# Patient Record
Sex: Female | Born: 1975 | Race: White | Hispanic: No | Marital: Married | Smoking: Never smoker
Health system: Southern US, Community
[De-identification: ages and names within clinical notes are randomized; demographics above are authoritative.]

## PROBLEM LIST (undated history)

## (undated) DIAGNOSIS — D649 Anemia, unspecified: Secondary | ICD-10-CM

## (undated) DIAGNOSIS — J9801 Acute bronchospasm: Secondary | ICD-10-CM

## (undated) DIAGNOSIS — G43909 Migraine, unspecified, not intractable, without status migrainosus: Secondary | ICD-10-CM

## (undated) DIAGNOSIS — K219 Gastro-esophageal reflux disease without esophagitis: Secondary | ICD-10-CM

## (undated) DIAGNOSIS — D219 Benign neoplasm of connective and other soft tissue, unspecified: Secondary | ICD-10-CM

## (undated) DIAGNOSIS — R7303 Prediabetes: Secondary | ICD-10-CM

## (undated) DIAGNOSIS — F329 Major depressive disorder, single episode, unspecified: Secondary | ICD-10-CM

## (undated) DIAGNOSIS — F32A Depression, unspecified: Secondary | ICD-10-CM

## (undated) DIAGNOSIS — T7421XA Adult sexual abuse, confirmed, initial encounter: Secondary | ICD-10-CM

## (undated) DIAGNOSIS — M1990 Unspecified osteoarthritis, unspecified site: Secondary | ICD-10-CM

## (undated) DIAGNOSIS — K900 Celiac disease: Secondary | ICD-10-CM

## (undated) DIAGNOSIS — L509 Urticaria, unspecified: Secondary | ICD-10-CM

## (undated) DIAGNOSIS — E7800 Pure hypercholesterolemia, unspecified: Secondary | ICD-10-CM

## (undated) DIAGNOSIS — R51 Headache: Secondary | ICD-10-CM

## (undated) DIAGNOSIS — M797 Fibromyalgia: Secondary | ICD-10-CM

## (undated) DIAGNOSIS — M549 Dorsalgia, unspecified: Secondary | ICD-10-CM

## (undated) DIAGNOSIS — I10 Essential (primary) hypertension: Secondary | ICD-10-CM

## (undated) DIAGNOSIS — F4310 Post-traumatic stress disorder, unspecified: Secondary | ICD-10-CM

## (undated) DIAGNOSIS — F419 Anxiety disorder, unspecified: Secondary | ICD-10-CM

## (undated) DIAGNOSIS — J3501 Chronic tonsillitis: Secondary | ICD-10-CM

## (undated) DIAGNOSIS — G4730 Sleep apnea, unspecified: Secondary | ICD-10-CM

## (undated) DIAGNOSIS — I639 Cerebral infarction, unspecified: Secondary | ICD-10-CM

## (undated) HISTORY — DX: Anemia, unspecified: D64.9

## (undated) HISTORY — DX: Dorsalgia, unspecified: M54.9

## (undated) HISTORY — DX: Benign neoplasm of connective and other soft tissue, unspecified: D21.9

## (undated) HISTORY — PX: OTHER SURGICAL HISTORY: SHX169

## (undated) HISTORY — DX: Acute bronchospasm: J98.01

## (undated) HISTORY — PX: PILONIDAL CYST EXCISION: SHX744

## (undated) HISTORY — DX: Major depressive disorder, single episode, unspecified: F32.9

## (undated) HISTORY — DX: Pure hypercholesterolemia, unspecified: E78.00

## (undated) HISTORY — DX: Migraine, unspecified, not intractable, without status migrainosus: G43.909

## (undated) HISTORY — DX: Depression, unspecified: F32.A

## (undated) HISTORY — PX: WISDOM TOOTH EXTRACTION: SHX21

## (undated) HISTORY — DX: Cerebral infarction, unspecified: I63.9

## (undated) HISTORY — DX: Prediabetes: R73.03

## (undated) HISTORY — DX: Celiac disease: K90.0

## (undated) HISTORY — DX: Urticaria, unspecified: L50.9

## (undated) HISTORY — DX: Adult sexual abuse, confirmed, initial encounter: T74.21XA

## (undated) HISTORY — DX: Essential (primary) hypertension: I10

---

## 2000-01-23 ENCOUNTER — Encounter: Payer: Self-pay | Admitting: Family Medicine

## 2000-01-23 ENCOUNTER — Encounter: Admission: RE | Admit: 2000-01-23 | Discharge: 2000-01-23 | Payer: Self-pay | Admitting: Family Medicine

## 2001-01-25 ENCOUNTER — Emergency Department (HOSPITAL_COMMUNITY): Admission: EM | Admit: 2001-01-25 | Discharge: 2001-01-26 | Payer: Self-pay | Admitting: Emergency Medicine

## 2002-08-11 ENCOUNTER — Ambulatory Visit (HOSPITAL_BASED_OUTPATIENT_CLINIC_OR_DEPARTMENT_OTHER): Admission: RE | Admit: 2002-08-11 | Discharge: 2002-08-11 | Payer: Self-pay | Admitting: Family Medicine

## 2005-01-01 ENCOUNTER — Ambulatory Visit (HOSPITAL_BASED_OUTPATIENT_CLINIC_OR_DEPARTMENT_OTHER): Admission: RE | Admit: 2005-01-01 | Discharge: 2005-01-01 | Payer: Self-pay | Admitting: General Surgery

## 2005-01-01 ENCOUNTER — Ambulatory Visit (HOSPITAL_COMMUNITY): Admission: RE | Admit: 2005-01-01 | Discharge: 2005-01-01 | Payer: Self-pay | Admitting: General Surgery

## 2005-01-01 ENCOUNTER — Encounter (INDEPENDENT_AMBULATORY_CARE_PROVIDER_SITE_OTHER): Payer: Self-pay | Admitting: *Deleted

## 2005-05-16 ENCOUNTER — Encounter: Admission: RE | Admit: 2005-05-16 | Discharge: 2005-06-28 | Payer: Self-pay

## 2005-07-02 ENCOUNTER — Ambulatory Visit (HOSPITAL_BASED_OUTPATIENT_CLINIC_OR_DEPARTMENT_OTHER): Admission: RE | Admit: 2005-07-02 | Discharge: 2005-07-02 | Payer: Self-pay | Admitting: General Surgery

## 2005-07-11 ENCOUNTER — Encounter: Admission: RE | Admit: 2005-07-11 | Discharge: 2005-10-09 | Payer: Self-pay | Admitting: Family Medicine

## 2006-02-04 ENCOUNTER — Encounter: Admission: RE | Admit: 2006-02-04 | Discharge: 2006-05-05 | Payer: Self-pay | Admitting: Family Medicine

## 2006-02-26 ENCOUNTER — Encounter (HOSPITAL_BASED_OUTPATIENT_CLINIC_OR_DEPARTMENT_OTHER): Admission: RE | Admit: 2006-02-26 | Discharge: 2006-03-14 | Payer: Self-pay | Admitting: Surgery

## 2006-03-18 ENCOUNTER — Encounter (HOSPITAL_BASED_OUTPATIENT_CLINIC_OR_DEPARTMENT_OTHER): Admission: RE | Admit: 2006-03-18 | Discharge: 2006-06-16 | Payer: Self-pay | Admitting: Surgery

## 2006-04-12 ENCOUNTER — Ambulatory Visit: Payer: Self-pay | Admitting: Pulmonary Disease

## 2006-04-12 ENCOUNTER — Inpatient Hospital Stay (HOSPITAL_COMMUNITY): Admission: EM | Admit: 2006-04-12 | Discharge: 2006-04-17 | Payer: Self-pay | Admitting: Emergency Medicine

## 2006-05-03 ENCOUNTER — Ambulatory Visit: Payer: Self-pay | Admitting: Pulmonary Disease

## 2006-06-25 ENCOUNTER — Encounter (HOSPITAL_BASED_OUTPATIENT_CLINIC_OR_DEPARTMENT_OTHER): Admission: RE | Admit: 2006-06-25 | Discharge: 2006-09-23 | Payer: Self-pay | Admitting: Surgery

## 2006-08-19 ENCOUNTER — Ambulatory Visit: Payer: Self-pay | Admitting: Internal Medicine

## 2007-02-04 ENCOUNTER — Encounter: Admission: RE | Admit: 2007-02-04 | Discharge: 2007-05-05 | Payer: Self-pay | Admitting: Family Medicine

## 2007-05-07 ENCOUNTER — Encounter: Admission: RE | Admit: 2007-05-07 | Discharge: 2007-07-15 | Payer: Self-pay | Admitting: Family Medicine

## 2007-08-20 ENCOUNTER — Encounter: Admission: RE | Admit: 2007-08-20 | Discharge: 2007-10-07 | Payer: Self-pay | Admitting: Family Medicine

## 2010-01-15 HISTORY — PX: COLONOSCOPY: SHX174

## 2010-01-15 HISTORY — PX: UPPER GASTROINTESTINAL ENDOSCOPY: SHX188

## 2010-05-30 NOTE — Assessment & Plan Note (Signed)
[facility]   NAME:  [name], [name]                 ACCOUNT NO.:  [id number]   MEDICAL RECORD NO.:  [id number]      DATE OF BIRTH:  May 24, 1975   PHYSICIAN:  Joneen Boers A. Nils Pyle, M.D. VISIT DATE:  07/10/2006                                   OFFICE VISIT   SUBJECTIVE:  The patient is a 35 year old lady with pilonidal cyst that  is healing by secondary intent.  She has been treated with Iodosorb gel  and local antiseptic wash.  She continues to be fully employed.  She is  having no pain.  There is no excessive drainage.  She does note that  there is some blood tinge on her cotton underwear.  There has been no  fever.   OBJECTIVE:  Blood pressure is 117/77, respirations 18, pulse rate 86,  temperature 98.4.  Inspection of the pilonidal area shows that the cyst  has continued to contract with healthy granulating tissue.  There is no  evidence of new sinus formation, nor is there suppurative change.  There  is no cellulitis.  There is no evidence of abscess.   IMPRESSION:  Clinical improvement.   PLAN:  We will continue antiseptic soap washes and topical Iodosorb gel.  The patient will be permitted to go into the pool.  We will re-evaluate  the patient in 2 weeks p.r.n.      Harold A. Nils Pyle, M.D.  Electronically Signed     HAN/MEDQ  D:  07/10/2006  T:  07/11/2006  Job:  [id number]

## 2010-05-30 NOTE — Assessment & Plan Note (Signed)
[facility]   NAME:  NAVEH, RICKLES                 ACCOUNT NO.:  [id number]   MEDICAL RECORD NO.:  [id number]      DATE OF BIRTH:  17-Aug-1975   PHYSICIAN:  Joneen Boers A. Nils Pyle, M.D. VISIT DATE:  07/29/2006                                   OFFICE VISIT   SUBJECTIVE:  Ms. [name] returns for followup of a slowly healing  pilonidal sinus.  The wound has been incompletely excised and was  allowed to heal by secondary intent.  In the interim she denies  excessive drainage, malodor, pain, or fever.  She continues to be  gainfully employed.  She has been in swimming pools without evidence of  drainage or fever.   OBJECTIVE:  Blood pressure is 127/88, respirations 20, pulse rate of 98,  temperature 98.2.  Inspection of the pilonidal wound shows that the area continues to  contract.  The granulation tissue is markedly friable but there is no  malodor.  There is no hyperemia and no evidence of loculation as probed  with a Q-tip.   ASSESSMENT:  Slowly healing pilonidal.   PLAN:  We will continue the Iodosorb gel and b.i.d. showers.  We will  continue the cotton balls as needed to control drainage.  We will  reevaluate the patient in 1 month p.r.n.      Harold A. Nils Pyle, M.D.  Electronically Signed     HAN/MEDQ  D:  07/29/2006  T:  07/30/2006  Job:  [id number]

## 2010-05-30 NOTE — Assessment & Plan Note (Signed)
[facility]   NAME:  BLONDINE, HOTTEL                 ACCOUNT NO.:  [id number]   MEDICAL RECORD NO.:  [id number]            DATE OF BIRTH:   PHYSICIAN:  Ricard Dillon, M.D. VISIT DATE:  06/28/2006                                   OFFICE VISIT   PURPOSE OF TODAY'S VISIT:  Followup of a pilonidal ductus cyst.  She has  continued to use Iodosorb and sitz baths daily.  She reports she has had  a week's worth of mild, bloody drainage; however, this has resolved on  its own.  She does not report any fever.   Inspection of the distal coccyx area shows that the pilonidal cyst looks  clean.  There is no evidence of surrounding infection.  There is some  epithelization.  The area is very painful to try and examine.  There is  no significant drainage.   IMPRESSION:  Continued improvement of a pilonidal duct cyst.  We are  going to continue the Iodosorb and sitz baths.  I wondered about some  form of topical dressing; however, this would need to be done by another  person.  As there is evidence of improvement, we continued the exact  same treatment.  I discussed this with her.  The patient expressed the  wish that we do our best to heal this over the summer when she is not  working.  We will see her again in 2 weeks.           ______________________________  Ricard Dillon, M.D.     MGR/MEDQ  D:  06/28/2006  T:  06/29/2006  Job:  [id number]

## 2010-05-30 NOTE — Assessment & Plan Note (Signed)
[facility]                             PULMONARY OFFICE NOTE   NAME:HOUCKJonay, Hitchcock                        MRN:          [id number]  DATE:08/19/2006                            DOB:          19-Mar-1975    CHIEF COMPLAINT:  Dyspnea.   HISTORY OF PRESENT ILLNESS:  A 35 year old, white female, status post  prolonged hospitalization at [facility] for a clinical diagnosis of  pneumonia in April, with  no organism specified.  She improved following  this hospitalization and was seen actually for post hospital followup  for the pneumonia by Dr. Halford Chessman who did not recommend any further followup  on April 18.  At that point, she weighed 234 pounds.  Subsequently, she  has had a progressive loss of activity tolerance where she has trouble  walking more than 20 minutes now.  Occasionally, she also has difficulty  with dyspnea sitting still, but never lying down of sleeping.  She has  noticed occasional heartburn symptoms, occasional chest discomfort, but  these are fleeting and not related to her dyspnea.  She also notices  marked voice fatigue with hoarseness.  She denies any pleuritic pain,  fevers, chills, sweats, orthopnea, PND or leg swelling.   PAST MEDICAL HISTORY:  1. Moderate obesity.  2. Periodic limb movement disorder.  3. Hypertension.  4. History of anemia.   ALLERGIES:  No known drug allergies.   MEDICATIONS:  She is on no regular medicines.  Previously, she had been  on ACE inhibitors.  These were stopped in May, she thinks.   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   FAMILY HISTORY:  Significant for the absence of asthma.  She says  everyone in the family is allergic to dust.   REVIEW OF SYSTEMS:  Taken in detail on the worksheet and negative,  except as outlined above.   PHYSICAL  EXAMINATION:  GENERAL:  This is a slightly anxious, but  pleasant, ambulatory, white female who clears her throat frequently  during exam and has a classic voice fatigue pattern of speech.  VITAL SIGNS:  She is afebrile with normal vital signs and a weight now  of 249 pounds.  HEENT:  Unremarkable.  Pharynx is clear.  NECK:  Supple without cervical adenopathy or tenderness.  Trachea  midline.  LUNGS:  Perfectly clear bilaterally to auscultation with excellent air  movement.  HEART:  Regular rate and rhythm without murmurs, rubs or gallops.  ABDOMEN:  Soft, benign.  EXTREMITIES:  Warm without calf tenderness, cyanosis, clubbing or edema.   We walked the patient around rapidly in the office for three laps each  consisting of 185 feet.  Her saturation maintained in the high 90s with  a peak heart rate of 119.   IMPRESSION:  Progressive weight gain is the most likely cause of the  patient's dyspnea.  The fact that she has paroxysms with dyspnea at rest  associated with voice  fatigue also suggests another complication of  weight gain, namely intermittent LPR/VCD.  PE or occult PF would not  cause this pattern of dyspnea which we could not reproduce here in the  office.   PLAN:  1. First, I reviewed a diet with her emphasizing the avoidance of mint      and menthol lozenges and the importance of also reducing calorie      intake to improve calorie balance overall.  Empiric treatment with      Aciphex 20 mg tablets taken before breakfast daily, 20 days of      samples given with a prescription filled or the option of either      filling a prescription or taking Prilosec if she feels she is      improving in terms of any of her symptoms on this regimen.  2. I would like to see the patient back in a month for PFTs and a      chest x-ray.     Christena Deem. Melvyn Novas, MD, [facility]  Electronically Signed    MBW/MedQ  DD: 08/19/2006  DT: 08/20/2006  Job #: [id number]   cc:   Suzi Roots

## 2010-06-02 NOTE — Discharge Summary (Signed)
[name], [name]                 ACCOUNT NO.:  [id number]   MEDICAL RECORD NO.:  [id number]          PATIENT TYPE:  INP   LOCATION:  1700                         FACILITY:  [facility]   PHYSICIAN:  Neysa Bonito, MD  DATE OF BIRTH:  [birth year]   DATE OF ADMISSION:  04/12/2006  DATE OF DISCHARGE:                               DISCHARGE SUMMARY   CHIEF COMPLAINT ON ADMISSION:  Cough, body aches, nausea, three days of  shortness of breath.   HISTORY OF PRESENT ILLNESS:  A 35 year old Caucasian female came with  shortness of breath and diagnosis of pneumonia.  The patient received  antibiotics during hospitalization, and she is stable since admission on  the antibiotic.  On the CAT scan on the admission date to rule out PE,  the impression was read as:  1)  No pulmonary emboli.  2)  Extensive  pneumonia at the right lung base.  3)  Mediastinal adenopathy,  questionable sarcoidosis.   Patient was seen by pulmonary service, Dr. Halford Chessman, for evaluation of  sarcoidosis.  The recommendation was to repeat the CT test, which was  done in April, 2008 with the impression of:  1)  Persistent right middle  and right lower lobe infiltrates.  There is a slightly less dense air  space consolidation.  2)  Small right-sided pleural effusion.  3)  Minimal left lower lobe infiltrates.  4)  Stable mediastinal and hilar  adenopathy.   The patient now has been fever-free for the last 24 hours.  We will  discharge the patient to continue on antibiotic for 14 more days, Avelox  400 mg p.o. and to follow up with her pulmonologist, Dr. Halford Chessman, as  discussed with her.   DISCHARGE DIAGNOSES:  1. Right middle and lower lobe pneumonia.  2. Hilar adenopathy with questionable sarcoid disease.  3. Hypertension.  4. Obesity.  5. Depression.  6. Migraine.  7. Anxiety.  8. Borderline diabetes.   DISCHARGE MEDICATIONS:  1. Avelox 400 mg p.o. daily x14 days.  2. Ferrous sulfate 325 mg p.o. daily.  3. Home oxygen  2-4 liters continuous.  4. Topamax 100 mg nightly.  5. Ambien 10 mg p.o. nightly.  6. Risperdal 0.5 mg daily.  7. Zoloft 200 mg p.o. daily.  8. Altace 2.5 mg orally daily.   DISCHARGE PLAN:  I have discussed with the patient.  Patient should  follow up with her pulmonologist, [facility], Dr. Halford Chessman.  The  phone number is [phone].  Patient will be discharged on home oxygen and  antibiotics, as discussed above.      Neysa Bonito, MD  Electronically Signed     EME/MEDQ  D:  04/17/2006  T:  04/17/2006  Job:  [id number]

## 2010-06-02 NOTE — Op Note (Signed)
NAMENETASHA, WEHRLI                 ACCOUNT NO.:  [id number]   MEDICAL RECORD NO.:  [id number]          PATIENT TYPE:  AMB   LOCATION:  DSC                          FACILITY:  [facility]   PHYSICIAN:  Gwenyth Ober, M.D.    DATE OF BIRTH:  1975-09-29   DATE OF PROCEDURE:  DATE OF DISCHARGE:                                 OPERATIVE REPORT   PREOPERATIVE DIAGNOSIS:  Nonhealing, previously excised pilonidal disease.   PREOPERATIVE DIAGNOSIS:  Nonhealing, previously excised pilonidal disease.   PROCEDURE:  Reexcision of pilonidal disease with debridement of nonhealing  wound.   SURGEON:  Gwenyth Ober, M.D.   ASSISTANT:  None.   ANESTHESIA:  General endotracheal.   POSITION:  Jackknife prone.   COMPLICATIONS:  None.   CONDITION:  Stable.   SPECIMENS:  No specimens were sent.   ESTIMATED BLOOD LOSS:  Less than 50 cubic centimeters.   INDICATION FOR OPERATION:  The patient is a healthy 35 year old who,  approximately 6 months ago, had a pilonidal disease excised, which has  failed to heal over several months' time, in spite of dressing changes and  wound care, and now comes in for re-exploration.   FINDINGS:  The patient had a nonhealing sinus deep into a cavity at the  fascial level.  The skin above that and fascia had healed well.  However,  this left an infected pocket, which we had to open and debris.   OPERATION:  The patient was taken to the operating room, placed initially on  a gurney in the supine position.  After an adequate endotracheal anesthetic  was administered, she was flipped into the jackknife prone position and  prepped and draped in the usual sterile manner, exposing the pilonidal  diseased area.   Used a Kelly clamp to define the cavity and the sinus track leading down to  the deep pocket just above the presacral fascia.  We opened up this entire  area, using a #10 blade.  Once we got down into the deepest portion of the  pocket, we used a rongeur and a  curette to scrape off all of the dead  infected tissue.  Also we used a Kelly clamp to remove a lot of the loose,  sort of liquefied necrotic tissue.  We scraped it well and opened it up down  to the fascial level  where, after we controlled hemostasis pretty well, we packed it with 6 wet 4  x 4 gauze, which had been opened up, soaked in saline, and covered with a  sterile dressing.  We did inject 10 cubic centimeters  of 0.25% Marcaine  with epinephrine into the wound.  The  final wound measured 10 cm x 5 cm  wide x 4.5 cm deep.      Gwenyth Ober, M.D.  Electronically Signed     JOW/MEDQ  D:  07/02/2005  T:  07/03/2005  Job:  [id number]

## 2010-06-02 NOTE — H&P (Signed)
NAMEBULAR, HICKOK                 ACCOUNT NO.:  [id number]   MEDICAL RECORD NO.:  [id number]          PATIENT TYPE:  INP   LOCATION:  0110                         [facility]AC[facility]  [facility]   PHYSICIAN:  Durwin Nora, MDDATE OF BIRTH:  1975/10/17   DATE OF ADMISSION:  04/12/2006  DATE OF DISCHARGE:                              HISTORY & PHYSICAL   PRESENTING COMPLAINT:  Cough, nausea, body ache, fever, three days.  Shortness of breath, one day.   HISTORY OF PRESENTING COMPLAINT:  This is a 35 year old Caucasian lady  was quite well until three days ago when she started experiencing on and  off fever with dry cough and generalized body aches.  She reported to  her primary care physician, Dr. Agustina Caroli, who had an EKG for her  and sent her home with analgesics; however, patient had recurrence of  the fever with nausea.  No vomiting.  No abdominal pain.  No diarrhea.  She has been having frequent urination but no dysuria.  She was  extremely diaphoretic, very sweaty last night, with high-grade fever and  chills.  No palpitations.  No hemoptysis.  She reported to the emergency  room, where PE was ruled out with a negative CT angiogram but chest x-  ray was showing opacities in the right mid lobe.   PAST MEDICAL HISTORY:  Migraine, depression, anxiety, borderline  diabetes.   PAST SURGICAL HISTORY:  Pilonidal cyst, removed in December, 2006 and  also in June, 2007.   FAMILY HISTORY:  Father has hypertension, diabetes,  hypercholesterolemia, CA of the prostate, CA of the lung, mother with  hypertension, hypercholesterol, diabetes, CVA.   SOCIAL HISTORY:  She denies any use of excessive alcohol, tobacco or  drug abuse.   MEDICATIONS:  1. Zoloft 200 mg daily.  2. Risperdal 0.5 mg daily.  3. Mirapex 10 mg daily.  4. Topamax 100 mg daily.   No known drug allergies.   PHYSICAL EXAMINATION:  GENERAL:  She is a young lady, not in acute  respiratory distress.  VITAL SIGNS:   Temperature 99.9, pulse 124, respiratory rate 20, blood  pressure 161/91.  HEENT:  Pallor, mild.  Anicteric.  Atraumatic and normocephalic.  Pupils  are equal, round and reactive to light and accommodation.  Oropharynx  clear.  HEART:  Sounds S1 and S2 with tachycardia.  LUNGS:  She has coarse breath sounds in the right hemithorax.  ABDOMEN:  Benign.  NEUROLOGIC:  She is alert and oriented x 3.  Peripheral pulses present.  No edema.  Cranial nerves I-XII intact.  Power is 5 in all limbs.   ASSESSMENT/PLAN:  Community-acquired pneumonia, anemia, sinus  tachycardia, moderate hypertension, obesity.  The plan is to admit to  the medical floor to [facility] hospitalist team.  Vital signs each  shift.  Activity:  Bed rest.  Pulse ox monitor to keep oxygen sats  greater than 90.  Cardiac diet.  IV fluids.  Normal saline at 60 cc/hr.  Blood culture x2.  Sputum culture if obtainable.  TSH, fasting lipid  panel, urinalysis, culture and sensitivity.  Lovenox  40 mg subcu daily.  Protonix 40 mg IV daily.  Phenergan 12.5 mg q.8h. p.r.n.  Duonebs 3 mg  q.6h. p.r.n.  Mucinex 1 tablet b.i.d.  Robitussin-DM 5 ml q.4h. p.r.n.  Tylenol 650 mg q.4-6h. p.r.n. fever and pain.  Rocephin 1 gm IV daily.  Zithromax 500 mg IV daily.  Iron sulfate 325 mg b.i.d.  Norvasc 5 mg  p.o. daily.  Zoloft 200 mg p.o. daily.  Risperdal 0.5 mg p.o. daily.  Ambien 10 mg p.o. daily.  Topamax 100 mg p.o. daily.  O2 two liters  nasal cannula.   treatment plan explained to her.  She verbalizes understanding.   TIME:  Forty-five minutes.      Durwin Nora, MD  Electronically Signed     MIO/MEDQ  D:  04/12/2006  T:  04/12/2006  Job:  [id number]

## 2010-06-02 NOTE — Consult Note (Signed)
[name], [name] NO.:  [id number]   MEDICAL RECORD NO.:  [id number]         PATIENT TYPE:  LINP   LOCATION:                               FACILITY:  [facility]   PHYSICIAN:  Chesley Mires, MD        DATE OF BIRTH:  [birth year]   DATE OF CONSULTATION:  DATE OF DISCHARGE:                                 CONSULTATION   Portions of dictation not audible ......................   She had undergone a CT scan of chest in the ER which shows a right  middle lobe consolidation with right lower lobe infiltrate and  mediastinal adenopathy.  She was started initially on Rocephin and  erythromycin but has since been changed to Avelox and vancomycin.  Two  episodes of oxygen desaturation.  She had a repeat CT of the chest which  shows essentially the same finding.  She also had pulmonary function  tests which shows a ____________________________________.  No  significant bronchodilator response.  Total capacity was 2.45 L which  was 46% predicted.  _____________ was 2 which was 34% predicted but  corrective lung volumes was _____ predicted.  This was done 4/1.  She  saids she has not had any recent travel history, she does own three cats  but has not had any overt animal exposure.  She works ________________   IMPRESSION:  Community-acquired pneumonia.   Repeat evaluation for the patient over the next several weeks, several  months, further evaluation for possible _________, Status post  bronchoscopy with tissue sampling, however I do not feel that she is  ___________________________.   I also do not feel that she needs to have addition of Vancomycin as it  is unlikely that she has a methicillin-resistant Staphylococcus aureus  as cause of her pneumonia, particularly in light of the fact that she  seems to have improved clinically with her previous course of  antibiotics.  Additionally, she states that she has had two previous  ___________________________- which per the  patient was negative for  sleep apnea.  She denied any history that would be significant for  restless leg syndrome.  The fact is she is found to have periodic leg  movements on her overnight placed __________ and therefore have  continued her on ________.  Additionally ________ levels checked with ambulation prior to discharge  to determine if she is in need of supplemental oxygen, at least  temporarily while at home.   I will follow along with you during the remainder of her hospital course  although I suspect she should be able to be discharged from the hospital  sometime soon and then further evaluation will be done with office  followup.      Chesley Mires, MD     VS/MEDQ  D:  04/16/2006  T:  04/16/2006  Job:  [id number]

## 2011-07-09 ENCOUNTER — Encounter (HOSPITAL_BASED_OUTPATIENT_CLINIC_OR_DEPARTMENT_OTHER): Payer: Self-pay | Admitting: *Deleted

## 2011-07-15 NOTE — H&P (Signed)
Assessment  . Pain in TMJ   (526.9) . The tonsils were enlarged   (474.11) . Abnormal auditory perception   (388.40) . Esophageal reflux   (530.81) . Obesity   (278.00) . Migraine headache   (346.90) Orders  Audiological Evaluation; Comprehensive Audiometry; Tympanometry Bilateral; Tympanometry Bilateral; Requested for: 03 Jul 2011. 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 Recommended she consult with her dentist about this. She had been prescribed a mouth guard years ago but it doesn't fit any longer. There is no evidence of ear disease and I doubt she ever had an ear infection.   Chronic tonsillitis. Recommend consideration for tonsillectomy. We discussed the risks and benefits of this procedure. She understands and agrees.   Reflux and excessive caffeine consumption. Recommend she try very hard to cut back on caffeine in the form of soft drinks and atrophic. We also discussed her obesity and her risks for diabetes, caffeine increases this risk. Reason For Visit  Aeriana [name] is here today at the kind request of Lynne Logan for consultation and opinion for ear infections and strep throat. HPI  She has a history of recurring tonsillopharyngitis. She's had multiple episodes of strep throat over the years, and over the past couple of years gets it several times each year. 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 Her tonsils are large most of the time. She does snore on a regular basis according to her husband. She has had trouble with obesity and is on medication to try to lose weight. She also suffers with chronic heartburn. She drinks about 30 ounces of cola each day. She used to drink 24 cans of Coca-Cola daily. She has recurring episodes of right ear pain that had been diagnosed as ear infection. She complains of some hearing loss on the right side. Allergies  No Known Drug Allergies. Current Meds    Phentermine HCl - 37.5 MG Oral Tablet;TAKE 1 TABLET DAILY; RPT Mirtazapine 15 MG Oral Tablet Dispersible;DISSOLVE ONE TABLET ON TONGUE AND SWALLOW WITH OR WITHOUT WATER ONCE DAILY AT BEDTIME.; Rx Zonisamide 100 MG Oral Capsule;TAKE 2 CAPSULE DAILY; Rx * Pt will increase to  2oo mg ,starting tonight 03/23/10, 23 Mar 2010 [place] 6 MG/0.5ML Subcutaneous Solution;INFUSE 1 ML ONCE PRN headache with needle and syringe to be given sub Q; Rx * PA approved by [facility] [phone]) through 05/08/12 - pt TG#[id number]., 09 May 2011. Active Problems  Acute Otitis Media (382.9) Acute Pharyngitis (462) Acute Suppurative Otitis Media (382.00) Anxiety (Symptom) (300.00) Carpal Tunnel Syndrome (354.0) Cough (Symptom) (786.2) Dyspepsia (536.8) Insomnia (780.52) Lower Back Pain (724.2) Migraine Headache (346.90) Radiculopathy (729.2) Restless Legs Syndrome (333.94) Tonsils Enlargement (474.11). PMH  Acute Bronchitis (466.0) Acute Pharyngitis (462) Acute Pharyngitis Streptococcus (034.0) Community-acquired Pneumonia (486) Cough (Symptom) (786.2) Cough (Symptom) (786.2) Malaise (780.79) Nausea (Symptom) (787.02) Mild Obstructive Sleep Apnea (327.23) Streptococcal Sore Throat (034.0) Viral Syndrome (079.99) Visit For: Examination For Adoption (V70.3). PSH  Oral Surgery Tooth Extraction Pilonidal Cyst Resection. Family Hx  Family history of Allergies Family history of Cancer Family history of Depressive Disorder, NOS Family history of Diabetes Mellitus Family history of Heart Disease Family history of Parkinson's Disease Family history of Seizure Family history of Stroke Syndrome. Personal Hx  Alcohol Use; rarely Caffeine Use; 1-2 Coke Zero/day Denied Drug Use Education History; Bachelors degree, persuing Masters Marital History - Single Never A Smoker Denied Tobacco Use Working Full Time; Pharmacist, hospital, preschool  Autism. ROS  Systemic: Feeling tired (fatigue)  and night  sweats. Head: Headache. Otolaryngeal: Earache, tinnitus, snoring, sneezing, hoarseness, and sore throat. Cardiovascular: Chest pain or discomfort. Pulmonary: Cough  and wheezing. Gastrointestinal: Dysphagia, heartburn, and nausea. Endocrine: Muscle weakness. Musculoskeletal: Calf muscle cramps, arthralgias, and soft tissue swelling. Neurological: Dizziness, fainting, tingling, and numbness. Psychological: Anxiety  and depression. 12 system ROS was obtained and reviewed on the Health Maintenance form dated today.  Positive responses are shown above.  If the symptom is not checked, the patient has denied it. Vital Signs   Recorded by Skolimowski,Sharon on 03 Jul 2011 10:18 AM BP:120/82,  Height: 64.5 in, Weight: 230 lb, BMI: 38.9 kg/m2,  BSA Calculated: 2.09 ,  BMI Calculated: 38.87. Physical Exam  APPEARANCE: Obese young lady, in no acute distress.  Normal affect, in a pleasant mood.  Oriented to time, place and person. COMMUNICATION: Normal voice   HEAD & FACE:  No scars, lesions or masses of head and face.  Sinuses nontender to palpation.  Salivary glands without mass or tenderness.  Facial strength symmetric.  No facial lesion, scars, or mass. EYES: EOMI with normal primary gaze alignment. Visual acuity grossly intact.  PERRLA EXTERNAL EAR & NOSE: No scars, lesions or masses  EAC & TYMPANIC MEMBRANE:  EAC shows no obstructing lesions or debris and tympanic membranes are normal bilaterally with good movement to insufflation. GROSS HEARING: Tuning fork is diminished on the right but lateralizes to the left. TMJ: Tender on the right when she opens. When she opens, her mandible deviates to the left side. INTRANASAL EXAM: No polyps or purulence.  NASOPHARYNX: Normal, without lesions. LIPS, TEETH & GUMS: No lip lesions, normal dentition and normal gums. ORAL CAVITY/OROPHARYNX:  Oral mucosa moist without lesion or asymmetry of the palate, tongue, tonsil or posterior pharynx. Tonsils are  relatively small and free of any infection today. NECK:  Supple without adenopathy or mass. THYROID:  Normal with no masses palpable.  NEUROLOGIC:  No gross CN deficits. No nystagmus noted.   LYMPHATIC:  No enlarged nodes palpable. Results  Tympanograms and audiometric thresholds are normal bilaterally.

## 2011-07-16 ENCOUNTER — Encounter (HOSPITAL_BASED_OUTPATIENT_CLINIC_OR_DEPARTMENT_OTHER): Payer: Self-pay | Admitting: Anesthesiology

## 2011-07-16 ENCOUNTER — Ambulatory Visit (HOSPITAL_BASED_OUTPATIENT_CLINIC_OR_DEPARTMENT_OTHER): Payer: BC Managed Care – PPO | Admitting: Anesthesiology

## 2011-07-16 ENCOUNTER — Encounter (HOSPITAL_BASED_OUTPATIENT_CLINIC_OR_DEPARTMENT_OTHER)
Admission: RE | Disposition: A | Discharge status: Home / Self Care | Payer: Self-pay | Source: Ambulatory Visit | Attending: Otolaryngology

## 2011-07-16 ENCOUNTER — Ambulatory Visit (HOSPITAL_BASED_OUTPATIENT_CLINIC_OR_DEPARTMENT_OTHER)
Admission: RE | Admit: 2011-07-16 | Discharge: 2011-07-16 | Disposition: A | Discharge status: Home / Self Care | Payer: BC Managed Care – PPO | Source: Ambulatory Visit | Attending: Otolaryngology | Admitting: Otolaryngology

## 2011-07-16 DIAGNOSIS — G43909 Migraine, unspecified, not intractable, without status migrainosus: Secondary | ICD-10-CM | POA: Insufficient documentation

## 2011-07-16 DIAGNOSIS — J3501 Chronic tonsillitis: Secondary | ICD-10-CM

## 2011-07-16 DIAGNOSIS — K219 Gastro-esophageal reflux disease without esophagitis: Secondary | ICD-10-CM | POA: Insufficient documentation

## 2011-07-16 HISTORY — DX: Gastro-esophageal reflux disease without esophagitis: K21.9

## 2011-07-16 HISTORY — DX: Anxiety disorder, unspecified: F41.9

## 2011-07-16 HISTORY — PX: TONSILLECTOMY: SHX5217

## 2011-07-16 HISTORY — DX: Headache: R51

## 2011-07-16 HISTORY — DX: Chronic tonsillitis: J35.01

## 2011-07-16 LAB — POCT HEMOGLOBIN-HEMACUE: Hemoglobin: 12.4 g/dL (ref 12.0–15.0)

## 2011-07-16 SURGERY — TONSILLECTOMY
Anesthesia: General | Site: Mouth | Wound class: Clean Contaminated

## 2011-07-16 MED ORDER — METOCLOPRAMIDE HCL 5 MG/ML IJ SOLN: 10.0000 mg | Freq: Once | INTRAMUSCULAR | Status: DC | PRN

## 2011-07-16 MED ORDER — ACETAMINOPHEN 10 MG/ML IV SOLN
1000.0000 mg | Freq: Once | INTRAVENOUS | Status: AC
Start: 2011-07-16 — End: 2011-07-16
  Administered 2011-07-16: 1000 mg via INTRAVENOUS

## 2011-07-16 MED ORDER — HYDROCODONE-ACETAMINOPHEN 7.5-500 MG/15ML PO SOLN: 10.0000 mL | Freq: Four times a day (QID) | ORAL | Status: DC | PRN

## 2011-07-16 MED ORDER — PROPOFOL 10 MG/ML IV EMUL
INTRAVENOUS | Status: DC | PRN
  Administered 2011-07-16: 250 mg via INTRAVENOUS

## 2011-07-16 MED ORDER — OXYCODONE HCL 5 MG PO TABS: 5.0000 mg | ORAL_TABLET | Freq: Once | ORAL | Status: DC | PRN

## 2011-07-16 MED ORDER — METOCLOPRAMIDE HCL 5 MG/ML IJ SOLN
INTRAMUSCULAR | Status: DC | PRN
  Administered 2011-07-16: 10 mg via INTRAVENOUS

## 2011-07-16 MED ORDER — FENTANYL CITRATE 0.05 MG/ML IJ SOLN: 50.0000 ug | INTRAMUSCULAR | Status: DC | PRN

## 2011-07-16 MED ORDER — ONDANSETRON HCL 4 MG/2ML IJ SOLN
INTRAMUSCULAR | Status: DC | PRN
  Administered 2011-07-16: 4 mg via INTRAVENOUS

## 2011-07-16 MED ORDER — FENTANYL CITRATE 0.05 MG/ML IJ SOLN
INTRAMUSCULAR | Status: DC | PRN
  Administered 2011-07-16: 50 ug via INTRAVENOUS
  Administered 2011-07-16: 100 ug via INTRAVENOUS

## 2011-07-16 MED ORDER — HYDROCODONE-ACETAMINOPHEN 7.5-500 MG/15ML PO SOLN
15.0000 mL | Freq: Four times a day (QID) | ORAL | Status: DC | PRN
  Administered 2011-07-16: 15 mL via ORAL

## 2011-07-16 MED ORDER — HYDROCODONE-ACETAMINOPHEN 7.5-500 MG/15ML PO SOLN: 15.0000 mL | Freq: Four times a day (QID) | ORAL | Status: AC | PRN

## 2011-07-16 MED ORDER — DEXAMETHASONE SODIUM PHOSPHATE 10 MG/ML IJ SOLN: 10.0000 mg | INTRAMUSCULAR | Status: DC

## 2011-07-16 MED ORDER — MIDAZOLAM HCL 5 MG/5ML IJ SOLN
INTRAMUSCULAR | Status: DC | PRN
  Administered 2011-07-16: 2 mg via INTRAVENOUS

## 2011-07-16 MED ORDER — SUCCINYLCHOLINE CHLORIDE 20 MG/ML IJ SOLN
INTRAMUSCULAR | Status: DC | PRN
  Administered 2011-07-16: 100 mg via INTRAVENOUS

## 2011-07-16 MED ORDER — MIDAZOLAM HCL 2 MG/2ML IJ SOLN: 0.5000 mg | INTRAMUSCULAR | Status: DC | PRN

## 2011-07-16 MED ORDER — LACTATED RINGERS IV SOLN
INTRAVENOUS | Status: DC
  Administered 2011-07-16 (×3): via INTRAVENOUS

## 2011-07-16 MED ORDER — HYDROMORPHONE HCL PF 1 MG/ML IJ SOLN
0.2500 mg | INTRAMUSCULAR | Status: DC | PRN
Start: 2011-07-16 — End: 2011-07-16
  Administered 2011-07-16 (×4): 0.25 mg via INTRAVENOUS

## 2011-07-16 MED ORDER — LIDOCAINE HCL (CARDIAC) 20 MG/ML IV SOLN
INTRAVENOUS | Status: DC | PRN
  Administered 2011-07-16: 100 mg via INTRAVENOUS

## 2011-07-16 MED ORDER — PROMETHAZINE HCL 25 MG RE SUPP: 25.0000 mg | Freq: Four times a day (QID) | RECTAL | Status: DC | PRN

## 2011-07-16 MED ORDER — DEXAMETHASONE SODIUM PHOSPHATE 4 MG/ML IJ SOLN
INTRAMUSCULAR | Status: DC | PRN
  Administered 2011-07-16: 10 mg via INTRAVENOUS

## 2011-07-16 SURGICAL SUPPLY — 26 items
CANISTER SUCTION 1200CC (MISCELLANEOUS) ×2 IMPLANT
CATH ROBINSON RED A/P 12FR (CATHETERS) ×2 IMPLANT
CLOTH BEACON ORANGE TIMEOUT ST (SAFETY) ×2 IMPLANT
COAGULATOR SUCT SWTCH 10FR 6 (ELECTROSURGICAL) IMPLANT
COVER MAYO STAND STRL (DRAPES) ×2 IMPLANT
ELECT COATED BLADE 2.86 ST (ELECTRODE) ×2 IMPLANT
ELECT REM PT RETURN 9FT ADLT (ELECTROSURGICAL)
ELECT REM PT RETURN 9FT PED (ELECTROSURGICAL)
ELECTRODE REM PT RETRN 9FT PED (ELECTROSURGICAL) IMPLANT
ELECTRODE REM PT RTRN 9FT ADLT (ELECTROSURGICAL) IMPLANT
GAUZE SPONGE 4X4 12PLY STRL LF (GAUZE/BANDAGES/DRESSINGS) ×2 IMPLANT
GLOVE ECLIPSE 7.5 STRL STRAW (GLOVE) ×2 IMPLANT
GOWN PREVENTION PLUS XLARGE (GOWN DISPOSABLE) IMPLANT
MARKER SKIN DUAL TIP RULER LAB (MISCELLANEOUS) IMPLANT
NS IRRIG 1000ML POUR BTL (IV SOLUTION) ×2 IMPLANT
PENCIL FOOT CONTROL (ELECTRODE) ×2 IMPLANT
SHEET MEDIUM DRAPE 40X70 STRL (DRAPES) ×2 IMPLANT
SOLUTION BUTLER CLEAR DIP (MISCELLANEOUS) IMPLANT
SPONGE TONSIL 1 RF SGL (DISPOSABLE) IMPLANT
SPONGE TONSIL 1.25 RF SGL STRG (GAUZE/BANDAGES/DRESSINGS) IMPLANT
SYR BULB 3OZ (MISCELLANEOUS) ×2 IMPLANT
TOWEL OR 17X24 6PK STRL BLUE (TOWEL DISPOSABLE) ×2 IMPLANT
TUBE CONNECTING 20X1/4 (TUBING) ×2 IMPLANT
TUBE SALEM SUMP 12R W/ARV (TUBING) IMPLANT
TUBE SALEM SUMP 16 FR W/ARV (TUBING) IMPLANT
WATER STERILE IRR 1000ML POUR (IV SOLUTION) ×2 IMPLANT

## 2011-07-16 NOTE — Anesthesia Procedure Notes (Signed)
Procedure Name: Intubation Date/Time: 07/16/2011 8:15 AM Performed by: Lyndee Leo Pre-anesthesia Checklist: Patient identified, Emergency Drugs available, Suction available and Patient being monitored Patient Re-evaluated:Patient Re-evaluated prior to inductionOxygen Delivery Method: Circle System Utilized Preoxygenation: Pre-oxygenation with 100% oxygen Intubation Type: IV induction Ventilation: Mask ventilation without difficulty Laryngoscope Size: Mac and 3 Grade View: Grade IV Tube type: Oral Number of attempts: 1 Airway Equipment and Method: stylet and oral airway Placement Confirmation: positive ETCO2 and breath sounds checked- equal and bilateral Secured at: 21 cm Tube secured with: Tape Dental Injury: Teeth and Oropharynx as per pre-operative assessment

## 2011-07-16 NOTE — Interval H&P Note (Signed)
History and Physical Interval Note:  07/16/2011 7:27 AM  [name]  has presented today for surgery, with the diagnosis of chronic tonsillitis  The various methods of treatment have been discussed with the patient and family. After consideration of risks, benefits and other options for treatment, the patient has consented to  Procedure(s) (LRB): TONSILLECTOMY (N/A) as a surgical intervention .  The patient's history has been reviewed, patient examined, no change in status, stable for surgery.  I have reviewed the patients' chart and labs.  Questions were answered to the patient's satisfaction.     [name]

## 2011-07-16 NOTE — Transfer of Care (Signed)
Immediate Anesthesia Transfer of Care Note  Patient: [name]  Procedure(s) Performed: Procedure(s) (LRB): TONSILLECTOMY (N/A)  Patient Location: PACU  Anesthesia Type: General  Level of Consciousness: awake and patient cooperative  Airway & Oxygen Therapy: Patient Spontanous Breathing and aerosol face mask  Post-op Assessment: Report given to PACU RN and Post -op Vital signs reviewed and stable  Post vital signs: Reviewed and stable  Complications: No apparent anesthesia complications

## 2011-07-16 NOTE — Discharge Instructions (Signed)
Tonsillectomy Care After These instructions give you information on caring for yourself after your procedure. Your doctor may also give you more specific instructions. Call your doctor if you have any problems or questions after your procedure. HOME CARE  Only take medicine as told by your doctor. Do not take aspirin. Aspirin increases the risk of bleeding.   Rest. You may feel tired for a while.   Eat soft and cold foods, such as ice cream, frozen ice pops, and cold drinks.   Avoid mouth washes and gargles.   Avoid people with colds and sore throats.  GET HELP RIGHT AWAY IF:  You develop a rash.   You have trouble breathing.   You have any allergy problems.   You have increased bleeding, you throw up (vomit), or you cough or spit up bright red blood.   You have increasing pain that is not controlled with medicine.   You have a temperature by mouth above 102 F (38.9 C), not controlled by medicine.   You feel lightheaded or pass out (faint).  MAKE SURE YOU:  Understand these instructions.   Will watch your condition.   Will get help right away if you are not doing well or get worse.  Document Released: 02/03/2010 Document Revised: 12/21/2010 Document Reviewed: 02/03/2010 [facility] Patient Information 2012 [place].   Post Anesthesia Home Care Instructions  Activity: Get plenty of rest for the remainder of the day. A responsible adult should stay with you for 24 hours following the procedure.  For the next 24 hours, DO NOT: -Drive a car -Paediatric nurse -Drink alcoholic beverages -Take any medication unless instructed by your physician -Make any legal decisions or sign important papers.  Meals: Start with liquid foods such as gelatin or soup. Progress to regular foods as tolerated. Avoid greasy, spicy, heavy foods. If nausea and/or vomiting occur, drink only clear liquids until the nausea and/or vomiting subsides. Call your physician if vomiting  continues.  Special Instructions/Symptoms: Your throat may feel dry or sore from the anesthesia or the breathing tube placed in your throat during surgery. If this causes discomfort, gargle with warm salt water. The discomfort should disappear within 24 hours.

## 2011-07-16 NOTE — Anesthesia Preprocedure Evaluation (Signed)
Anesthesia Evaluation  Patient identified by MRN, date of birth, ID band Patient awake    Reviewed: Allergy & Precautions, H&P , NPO status , Patient's Chart, lab work & pertinent test results, reviewed documented beta blocker date and time   Airway Mallampati: II TM Distance: >3 FB Neck ROM: full    Dental   Pulmonary neg pulmonary ROS,          Cardiovascular negative cardio ROS      Neuro/Psych  Headaches, negative neurological ROS  negative psych ROS   GI/Hepatic Neg liver ROS, GERD-  Medicated and Controlled,  Endo/Other  Morbid obesity  Renal/GU negative Renal ROS  negative genitourinary   Musculoskeletal   Abdominal   Peds  Hematology negative hematology ROS (+)   Anesthesia Other Findings See surgeon's H&P   Reproductive/Obstetrics negative OB ROS                           Anesthesia Physical Anesthesia Plan  ASA: III  Anesthesia Plan: General   Post-op Pain Management:    Induction: Intravenous  Airway Management Planned: Oral ETT  Additional Equipment:   Intra-op Plan:   Post-operative Plan: Extubation in OR  Informed Consent: I have reviewed the patients History and Physical, chart, labs and discussed the procedure including the risks, benefits and alternatives for the proposed anesthesia with the patient or authorized representative who has indicated his/her understanding and acceptance.   Dental Advisory Given  Plan Discussed with: CRNA and Surgeon  Anesthesia Plan Comments:         Anesthesia Quick Evaluation

## 2011-07-16 NOTE — Anesthesia Postprocedure Evaluation (Signed)
Anesthesia Post Note  Patient: [name]  Procedure(s) Performed: Procedure(s) (LRB): TONSILLECTOMY (N/A)  Anesthesia type: General  Patient location: PACU  Post pain: Pain level controlled  Post assessment: Patient's Cardiovascular Status Stable  Last Vitals:  Filed Vitals:   07/16/11 0930  BP: 102/71  Pulse: 58  Temp:   Resp: 15    Post vital signs: Reviewed and stable  Level of consciousness: alert  Complications: No apparent anesthesia complications

## 2011-07-16 NOTE — Op Note (Signed)
07/16/2011  8:35 AM  PATIENT:  [name]  36 y.o. female  PRE-OPERATIVE DIAGNOSIS:  chronic tonsillitis  POST-OPERATIVE DIAGNOSIS:  same as preop  PROCEDURE:  Procedure(s): TONSILLECTOMY  SURGEON:  Surgeon(s): Izora Gala, MD  ANESTHESIA:   general  COUNTS:  YES   DICTATION: The patient was taken to the operating room and placed on the operating table in the supine position. Following induction of general endotracheal anesthesia, the table was turned and the patient was draped in a standard fashion. A Crowe-Davis mouthgag was inserted into the oral cavity and used to retract the tongue and mandible, then attached to the Mayo stand.  The tonsillectomy was then performed using electrocautery dissection, carefully dissecting the avascular plane between the capsule and constrictor muscles. Cautery was used for completion of hemostasis. The tonsils were moderately enlarged, deeply cryptic with fibrosis, and were discarded.  The pharynx was irrigated with saline and suctioned. An oral gastric tube was used to aspirate the contents of the stomach. The patient was then awakened from anesthesia and transferred to PACU in stable condition.   PATIENT DISPOSITION:  PACU - hemodynamically stable.

## 2011-07-20 ENCOUNTER — Encounter (HOSPITAL_BASED_OUTPATIENT_CLINIC_OR_DEPARTMENT_OTHER): Payer: Self-pay | Admitting: Otolaryngology

## 2011-09-25 ENCOUNTER — Other Ambulatory Visit: Payer: Self-pay | Admitting: Family Medicine

## 2011-09-25 DIAGNOSIS — R1084 Generalized abdominal pain: Secondary | ICD-10-CM

## 2011-09-25 DIAGNOSIS — R197 Diarrhea, unspecified: Secondary | ICD-10-CM

## 2011-09-28 ENCOUNTER — Ambulatory Visit
Admission: RE | Admit: 2011-09-28 | Discharge: 2011-09-28 | Disposition: A | Discharge status: Home / Self Care | Payer: BC Managed Care – PPO | Source: Ambulatory Visit | Attending: Family Medicine | Admitting: Family Medicine

## 2011-09-28 DIAGNOSIS — R1084 Generalized abdominal pain: Secondary | ICD-10-CM

## 2011-09-28 DIAGNOSIS — R197 Diarrhea, unspecified: Secondary | ICD-10-CM

## 2011-12-24 ENCOUNTER — Emergency Department (HOSPITAL_COMMUNITY)
Admission: EM | Admit: 2011-12-24 | Discharge: 2011-12-25 | Disposition: A | Discharge status: Home / Self Care | Payer: BC Managed Care – PPO | Attending: Emergency Medicine | Admitting: Emergency Medicine

## 2011-12-24 ENCOUNTER — Encounter (HOSPITAL_COMMUNITY): Payer: Self-pay | Admitting: *Deleted

## 2011-12-24 DIAGNOSIS — F411 Generalized anxiety disorder: Secondary | ICD-10-CM | POA: Insufficient documentation

## 2011-12-24 DIAGNOSIS — R51 Headache: Secondary | ICD-10-CM | POA: Insufficient documentation

## 2011-12-24 DIAGNOSIS — M79609 Pain in unspecified limb: Secondary | ICD-10-CM | POA: Insufficient documentation

## 2011-12-24 DIAGNOSIS — M546 Pain in thoracic spine: Secondary | ICD-10-CM | POA: Insufficient documentation

## 2011-12-24 DIAGNOSIS — J3501 Chronic tonsillitis: Secondary | ICD-10-CM | POA: Insufficient documentation

## 2011-12-24 DIAGNOSIS — K219 Gastro-esophageal reflux disease without esophagitis: Secondary | ICD-10-CM | POA: Insufficient documentation

## 2011-12-24 DIAGNOSIS — R0602 Shortness of breath: Secondary | ICD-10-CM | POA: Insufficient documentation

## 2011-12-24 DIAGNOSIS — R0789 Other chest pain: Secondary | ICD-10-CM | POA: Insufficient documentation

## 2011-12-24 DIAGNOSIS — Z79899 Other long term (current) drug therapy: Secondary | ICD-10-CM | POA: Insufficient documentation

## 2011-12-24 LAB — CBC WITH DIFFERENTIAL/PLATELET
Basophils Absolute: 0 10*3/uL (ref 0.0–0.1)
Basophils Relative: 0 % (ref 0–1)
Eosinophils Relative: 2 % (ref 0–5)
Lymphocytes Relative: 28 % (ref 12–46)
MCHC: 32.7 g/dL (ref 30.0–36.0)
Monocytes Absolute: 0.7 10*3/uL (ref 0.1–1.0)
Neutro Abs: 6 10*3/uL (ref 1.7–7.7)
Platelets: 255 10*3/uL (ref 150–400)
RDW: 14.1 % (ref 11.5–15.5)
WBC: 9.6 10*3/uL (ref 4.0–10.5)

## 2011-12-24 LAB — COMPREHENSIVE METABOLIC PANEL
ALT: 30 U/L (ref 0–35)
AST: 40 U/L — ABNORMAL HIGH (ref 0–37)
Albumin: 3.5 g/dL (ref 3.5–5.2)
CO2: 24 mEq/L (ref 19–32)
Calcium: 9.5 mg/dL (ref 8.4–10.5)
Chloride: 102 mEq/L (ref 96–112)
Creatinine, Ser: 0.69 mg/dL (ref 0.50–1.10)
GFR calc non Af Amer: >90 mL/min (ref >90)
Sodium: 136 mEq/L (ref 135–145)

## 2011-12-24 NOTE — ED Notes (Signed)
Pt c/o left arm pain today; developed chest pain later this evening; describes as a stabbing pain

## 2011-12-25 ENCOUNTER — Emergency Department (HOSPITAL_COMMUNITY): Payer: BC Managed Care – PPO

## 2011-12-25 MED ORDER — SODIUM CHLORIDE 0.9 % IV SOLN
Freq: Once | INTRAVENOUS | Status: AC
  Administered 2011-12-25: 02:00:00 via INTRAVENOUS

## 2011-12-25 MED ORDER — MORPHINE SULFATE 4 MG/ML IJ SOLN
4.0000 mg | Freq: Once | INTRAMUSCULAR | Status: AC
  Administered 2011-12-25: 4 mg via INTRAVENOUS
  Filled 2011-12-25: qty 1

## 2011-12-25 MED ORDER — SODIUM CHLORIDE 0.9 % IV SOLN
Freq: Once | INTRAVENOUS | Status: AC
  Administered 2011-12-25: via INTRAVENOUS

## 2011-12-25 MED ORDER — HYDROCODONE-ACETAMINOPHEN 5-325 MG PO TABS: 1.0000 | ORAL_TABLET | ORAL | Status: DC | PRN

## 2011-12-25 MED ORDER — KETOROLAC TROMETHAMINE 30 MG/ML IJ SOLN
30.0000 mg | Freq: Once | INTRAMUSCULAR | Status: AC
  Administered 2011-12-25: 30 mg via INTRAVENOUS
  Filled 2011-12-25: qty 1

## 2011-12-25 NOTE — ED Provider Notes (Signed)
History     CSN: [id number]  Arrival date & time 12/24/11  2016   First MD Initiated Contact with Patient 12/24/11 2303      Chief Complaint  Patient presents with  . Chest Pain    (Consider location/radiation/quality/duration/timing/severity/associated sxs/prior treatment) HPI History provided by pt.   Pt has had constant, dull/achy pain in her left arm the entire day.  Attributed to change in weather.  At approx 7pm, while driving home from dinner, she developed sharp, constant pain in left upper chest w/ associated SOB.  Had some pain in upper back as well.  Aggravated by movement, deep inspiration and laying flat.  No relief w/ tums. Denies fever, diaphoresis, cough, vomiting, abdominal pain, LE edema/pain.  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  No RF for PE.  Low risk ACS.    Past Medical History  Diagnosis Date  . Headache   . Anxiety   . GERD (gastroesophageal reflux disease)   . Chronic tonsillitis     Past Surgical History  Procedure Date  . Pilonidal cyst excision     x2  . Wisdom tooth extraction   . Tonsillectomy 07/16/2011    Procedure: TONSILLECTOMY;  Surgeon: Serena Colonel, MD;  Location: [facility];  Service: ENT;  Laterality: N/A;    No family history on file.  History  Substance Use Topics  . Smoking status: Never Smoker   . Smokeless tobacco: Not on file  . Alcohol Use: Yes     Comment: social    OB History    Grav Para Term Preterm Abortions TAB SAB Ect Mult Living                  Review of Systems  All other systems reviewed and are negative.    Allergies  Gluten meal  Home Medications   Current Outpatient Rx  Name  Route  Sig  Dispense  Refill  . ACETAMINOPHEN 500 MG PO TABS   Oral   Take 1,000 mg by mouth every 6 (six) hours as needed. For pain.         Marland Kitchen CALCIUM CARBONATE ANTACID 500 MG PO CHEW   Oral   Chew 2 tablets by mouth daily as needed. For indigestion.         Marland Kitchen KETOROLAC TROMETHAMINE 15.75  MG/SPRAY NA SOLN   Nasal   Place 1 spray into the nose daily as needed. For headaches.         Marland Kitchen MIRTAZAPINE 15 MG PO TABS   Oral   Take 7.5 mg by mouth at bedtime.          . SUMATRIPTAN SUCCINATE 6 MG/0.5ML [place]mer SOLN   Subcutaneous   Inject 6 mg into the skin every 2 (two) hours as needed. For migraines.           BP 103/47  Pulse 84  Temp 98.6 F (37 C) (Oral)  Resp 19  SpO2 100%  LMP 12/10/2011  Physical Exam  Nursing note and vitals reviewed. Constitutional: She is oriented to person, place, and time. She appears well-developed and well-nourished. No distress.  HENT:  Head: Normocephalic and atraumatic.  Eyes:       Normal appearance  Neck: Normal range of motion.  Cardiovascular: Normal rate, regular rhythm and intact distal pulses.   Pulmonary/Chest: Breath sounds normal. No respiratory distress.       Pt mildly tachypneic. No pleuritic pain reported.  Left chest ttp and pain also  aggravated by passive ROM of LUE.  Diffuse tenderness of LUE as well.   Abdominal: Soft. Bowel sounds are normal. She exhibits no distension. There is no tenderness. There is no guarding.  Musculoskeletal: Normal range of motion.       No peripheral edema or calf tenderness  Neurological: She is alert and oriented to person, place, and time.  Skin: Skin is warm and dry. No rash noted.  Psychiatric: She has a normal mood and affect. Her behavior is normal.    ED Course  Procedures (including critical care time)   Date: 12/25/2011  Rate: 85  Rhythm: normal sinus rhythm  QRS Axis: normal  Intervals: normal  ST/T Wave abnormalities: normal  Conduction Disutrbances:none  Narrative Interpretation:   Old EKG Reviewed: unchanged   Labs Reviewed  CBC WITH DIFFERENTIAL - Abnormal; Notable for the following:    Hemoglobin 11.7 (*)     HCT 35.8 (*)     MCH 25.7 (*)     All other components within normal limits  COMPREHENSIVE METABOLIC PANEL - Abnormal; Notable for the following:     AST 40 (*)     Total Bilirubin 0.2 (*)     All other components within normal limits  TROPONIN I  D-DIMER, QUANTITATIVE   Dg Chest 2 View  12/25/2011  *RADIOLOGY REPORT*  Clinical Data: Chest and left arm pain.  Chest pressure.  CHEST - 2 VIEW  Comparison: 04/25/2006  Findings: The heart size and pulmonary vascularity are normal. The lungs appear clear and expanded without focal air space disease or consolidation. No blunting of the costophrenic angles.  No pneumothorax.  Mediastinal contours appear intact.  IMPRESSION: No evidence of active pulmonary disease.   Original Report Authenticated By: Burman Nieves, M.D.      1. Musculoskeletal chest pain       MDM  36yo healhty F presents w/ non-traumatic LUE and L anterior CP.  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  Pain reproducible on exam w/ palpation and ROM of LUE.  CXR neg.  No RF for or exam findings consistent w/ PE and d-dimer neg.  Low risk ACS, pain atypical, EKG non-ischemic and troponin neg (drawn 4 hours after onset of pain).  All results discussed w/ patient.  Pain likely musculoskeletal.  Her pain is improved w/ IV toradol.  Prescribed vicodin and recommended NSAID, ice, avoidance of aggravating activities and f/u w/ PCP.  Strict return precautions discussed. 2:25 AM         Otilio Miu, PA-C 12/25/11 [phone]

## 2011-12-25 NOTE — ED Provider Notes (Signed)
Medical screening examination/treatment/procedure(s) were performed by non-physician practitioner and as supervising physician I was immediately available for consultation/collaboration.   Hoy Morn, MD 12/25/11 [phone]

## 2012-07-14 ENCOUNTER — Encounter: Payer: Self-pay | Admitting: Certified Nurse Midwife

## 2012-07-15 ENCOUNTER — Ambulatory Visit: Payer: Self-pay | Admitting: Certified Nurse Midwife

## 2012-07-15 ENCOUNTER — Encounter: Payer: Self-pay | Admitting: Certified Nurse Midwife

## 2012-07-15 ENCOUNTER — Ambulatory Visit (INDEPENDENT_AMBULATORY_CARE_PROVIDER_SITE_OTHER): Payer: BC Managed Care – PPO | Admitting: Certified Nurse Midwife

## 2012-07-15 VITALS — BP 110/70 | HR 68 | Resp 16 | Ht 63.75 in | Wt 245.0 lb

## 2012-07-15 DIAGNOSIS — Z01419 Encounter for gynecological examination (general) (routine) without abnormal findings: Secondary | ICD-10-CM

## 2012-07-15 DIAGNOSIS — Z0000 Encounter for general adult medical examination without abnormal findings: Secondary | ICD-10-CM

## 2012-07-15 LAB — HEMOGLOBIN, FINGERSTICK: Hemoglobin, fingerstick: 12.4 g/dL (ref 12.0–16.0)

## 2012-07-15 NOTE — Progress Notes (Signed)
37 y.o. G0P0000 Single Caucasian Fe here for annual exam. Periods normal, no issues. Now has two foster children and planning adoption if all goes well. Diagnosed Celiac disease in 12/13 after colonoscopy. Working on diet control now, and health has improved with change.  No health issues today.  Patient's last menstrual period was 07/06/2012.          Sexually active: yes  The current method of family planning is none.    Exercising: no  exercise Smoker:  no  Health Maintenance: Pap:  06-28-10 neg MMG:  none Colonoscopy:  10/13 BMD:   none TDaP:  2005 ? Labs: Hgb-12.4 Self breast exam: occ   reports that she has never smoked. She does not have any smokeless tobacco history on file. She reports that she does not drink alcohol or use illicit drugs.  Past Medical History  Diagnosis Date  . Headache(784.0)   . Anxiety   . GERD (gastroesophageal reflux disease)   . Chronic tonsillitis     Past Surgical History  Procedure Laterality Date  . Pilonidal cyst excision      x2  . Wisdom tooth extraction    . Tonsillectomy  07/16/2011    Procedure: TONSILLECTOMY;  Surgeon: Serena Colonel, MD;  Location: [facility];  Service: ENT;  Laterality: N/A;    Current Outpatient Prescriptions  Medication Sig Dispense Refill  . acetaminophen (TYLENOL) 500 MG tablet Take 1,000 mg by mouth every 6 (six) hours as needed. For pain.      . Ascorbic Acid (VITAMIN C PO) Take by mouth.      . calcium carbonate (TUMS - DOSED IN MG ELEMENTAL CALCIUM) 500 MG chewable tablet Chew 2 tablets by mouth daily as needed. For indigestion.      Marland Kitchen HYDROcodone-acetaminophen (NORCO/VICODIN) 5-325 MG per tablet Take 1 tablet by mouth every 4 (four) hours as needed for pain.  20 tablet  0  . Ibuprofen (ADVIL PO) Take by mouth as needed.      Marland Kitchen Ketorolac Tromethamine (SPRIX) 15.75 MG/SPRAY SOLN Place 1 spray into the nose daily as needed. For headaches.      . mirtazapine (REMERON) 15 MG tablet Take 7.5 mg by  mouth at bedtime.       . Multiple Vitamins-Minerals (MULTIVITAMIN PO) Take by mouth.      . Omega-3 Fatty Acids (FISH OIL PO) Take by mouth 4 (four) times a week.      Marland Kitchen PHENTERMINE HCL PO Take by mouth.      . SUMAtriptan (IMITREX) 6 MG/0.5ML SOLN injection Inject 6 mg into the skin every 2 (two) hours as needed. For migraines.       No current facility-administered medications for this visit.    Family History  Problem Relation Age of Onset  . Thyroid disease Mother   . Heart attack Mother   . Stroke Mother   . Kidney cancer Mother   . Thyroid disease Father   . Stroke Father   . Thyroid disease Brother   . Stroke Maternal Grandmother   . Stroke Maternal Grandfather     ROS:  Pertinent items are noted in HPI.  Otherwise, a comprehensive ROS was negative.  Exam:   Ht 5' 3.75" (1.619 m)  Wt 245 lb (111.131 kg)  BMI 42.4 kg/m2  LMP 07/06/2012 Height: 5' 3.75" (161.9 cm)  Ht Readings from Last 3 Encounters:  07/15/12 5' 3.75" (1.619 m)  07/09/11 5' 4.5" (1.638 m)  07/09/11 5' 4.5" (1.638 m)  General appearance: alert, cooperative and appears stated age Head: Normocephalic, without obvious abnormality, atraumatic Neck: no adenopathy, supple, symmetrical, trachea midline and thyroid normal to inspection and palpation Lungs: clear to auscultation bilaterally Breasts: normal appearance, no masses or tenderness, No nipple retraction or dimpling, No nipple discharge or bleeding, No axillary or supraclavicular adenopathy Heart: regular rate and rhythm Abdomen: soft, non-tender; no masses,  no organomegaly Extremities: extremities normal, atraumatic, no cyanosis or edema Skin: Skin color, texture, turgor normal. No rashes or lesions Lymph nodes: Cervical, supraclavicular, and axillary nodes normal. No abnormal inguinal nodes palpated Neurologic: Grossly normal   Pelvic: External genitalia:  no lesions              Urethra:  normal appearing urethra with no masses,  tenderness or lesions              Bartholin's and Skene's: normal                 Vagina: normal appearing vagina with normal color and discharge, no lesions              Cervix: normal, non tender, bleeding with pap only              Pap taken: yes Bimanual Exam:  Uterus:  normal size, contour, position, consistency, mobility, non-tender and mid position              Adnexa: normal adnexa and no mass, fullness, tenderness               Rectovaginal: Confirms               Anus:  normal sphincter tone, no lesions  A:  Well Woman with normal exam  Contraception: none needed( spouse infertility)  New diagnosis of Celiac disease under GI management  P:   Reviewed health and wellness pertinent to exam  Continue follow up with GI as indicated.  Pap smear as per guidelines   pap smear taken today  counseled on breast self exam, adequate intake of calcium and vitamin D, diet and exercise return annually or prn  An After Visit Summary was printed and given to the patient.

## 2012-07-15 NOTE — Patient Instructions (Addendum)
General topics  Next pap or exam is  due in 1 year Take a Women's multivitamin Take 1200 mg. of calcium daily - prefer dietary If any concerns in interim to call back  Breast Self-Awareness Practicing breast self-awareness may pick up problems early, prevent significant medical complications, and possibly save your life. By practicing breast self-awareness, you can become familiar with how your breasts look and feel and if your breasts are changing. This allows you to notice changes early. It can also offer you some reassurance that your breast health is good. One way to learn what is normal for your breasts and whether your breasts are changing is to do a breast self-exam. If you find a lump or something that was not present in the past, it is best to contact your caregiver right away. Other findings that should be evaluated by your caregiver include nipple discharge, especially if it is bloody; skin changes or reddening; areas where the skin seems to be pulled in (retracted); or new lumps and bumps. Breast pain is seldom associated with cancer (malignancy), but should also be evaluated by a caregiver. BREAST SELF-EXAM The best time to examine your breasts is 5 7 days after your menstrual period is over.  ExitCare Patient Information 2013 [employer].   Exercise to Stay Healthy Exercise helps you become and stay healthy. EXERCISE IDEAS AND TIPS Choose exercises that:  You enjoy.  Fit into your day. You do not need to exercise really hard to be healthy. You can do exercises at a slow or medium level and stay healthy. You can:  Stretch before and after working out.  Try yoga, Pilates, or tai chi.  Lift weights.  Walk fast, swim, jog, run, climb stairs, bicycle, dance, or rollerskate.  Take aerobic classes. Exercises that burn about 150 calories:  Running 1  miles in 15 minutes.  Playing volleyball for 45 to 60 minutes.  Washing and waxing a car for 45 to 60  minutes.  Playing touch football for 45 minutes.  Walking 1  miles in 35 minutes.  Pushing a stroller 1  miles in 30 minutes.  Playing basketball for 30 minutes.  Raking leaves for 30 minutes.  Bicycling 5 miles in 30 minutes.  Walking 2 miles in 30 minutes.  Dancing for 30 minutes.  Shoveling snow for 15 minutes.  Swimming laps for 20 minutes.  Walking up stairs for 15 minutes.  Bicycling 4 miles in 15 minutes.  Gardening for 30 to 45 minutes.  Jumping rope for 15 minutes.  Washing windows or floors for 45 to 60 minutes. Document Released: 02/03/2010 Document Revised: 03/26/2011 Document Reviewed: 02/03/2010 ExitCare Patient Information 2013 [employer].   Other topics ( that may be useful information):    Sexually Transmitted Disease Sexually transmitted disease (STD) refers to any infection that is passed from person to person during sexual activity. This may happen by way of saliva, semen, blood, vaginal mucus, or urine. Common STDs include:  Gonorrhea.  Chlamydia.  Syphilis.  HIV/AIDS.  Genital herpes.  Hepatitis B and C.  Trichomonas.  Human papillomavirus (HPV).  Pubic lice. CAUSES  An STD may be spread by bacteria, virus, or parasite. A person can get an STD by:  Sexual intercourse with an infected person.  Sharing sex toys with an infected person.  Sharing needles with an infected person.  Having intimate contact with the genitals, mouth, or rectal areas of an infected person. SYMPTOMS  Some people may not have any symptoms, but   they can still pass the infection to others. Different STDs have different symptoms. Symptoms include:  Painful or bloody urination.  Pain in the pelvis, abdomen, vagina, anus, throat, or eyes.  Skin rash, itching, irritation, growths, or sores (lesions). These usually occur in the genital or anal area.  Abnormal vaginal discharge.  Penile discharge in men.  Soft, flesh-colored skin growths in the  genital or anal area.  Fever.  Pain or bleeding during sexual intercourse.  Swollen glands in the groin area.  Yellow skin and eyes (jaundice). This is seen with hepatitis. DIAGNOSIS  To make a diagnosis, your caregiver may:  Take a medical history.  Perform a physical exam.  Take a specimen (culture) to be examined.  Examine a sample of discharge under a microscope.  Perform blood test TREATMENT   Chlamydia, gonorrhea, trichomonas, and syphilis can be cured with antibiotic medicine.  Genital herpes, hepatitis, and HIV can be treated, but not cured, with prescribed medicines. The medicines will lessen the symptoms.  Genital warts from HPV can be treated with medicine or by freezing, burning (electrocautery), or surgery. Warts may come back.  HPV is a virus and cannot be cured with medicine or surgery.However, abnormal areas may be followed very closely by your caregiver and may be removed from the cervix, vagina, or vulva through office procedures or surgery. If your diagnosis is confirmed, your recent sexual partners need treatment. This is true even if they are symptom-free or have a negative culture or evaluation. They should not have sex until their caregiver says it is okay. HOME CARE INSTRUCTIONS  All sexual partners should be informed, tested, and treated for all STDs.  Take your antibiotics as directed. Finish them even if you start to feel better.  Only take over-the-counter or prescription medicines for pain, discomfort, or fever as directed by your caregiver.  Rest.  Eat a balanced diet and drink enough fluids to keep your urine clear or pale yellow.  Do not have sex until treatment is completed and you have followed up with your caregiver. STDs should be checked after treatment.  Keep all follow-up appointments, Pap tests, and blood tests as directed by your caregiver.  Only use latex condoms and water-soluble lubricants during sexual activity. Do not use  petroleum jelly or oils.  Avoid alcohol and illegal drugs.  Get vaccinated for HPV and hepatitis. If you have not received these vaccines in the past, talk to your caregiver about whether one or both might be right for you.  Avoid risky sex practices that can break the skin. The only way to avoid getting an STD is to avoid all sexual activity.Latex condoms and dental dams (for oral sex) will help lessen the risk of getting an STD, but will not completely eliminate the risk. SEEK MEDICAL CARE IF:   You have a fever.  You have any new or worsening symptoms. Document Released: 03/24/2002 Document Revised: 03/26/2011 Document Reviewed: 03/31/2010 ExitCare Patient Information 2013 [employer].    Domestic Abuse You are being battered or abused if someone close to you hits, pushes, or physically hurts you in any way. You also are being abused if you are forced into activities. You are being sexually abused if you are forced to have sexual contact of any kind. You are being emotionally abused if you are made to feel worthless or if you are constantly threatened. It is important to remember that help is available. No one has the right to abuse you. PREVENTION OF FURTHER   ABUSE  Learn the warning signs of danger. This varies with situations but may include: the use of alcohol, threats, isolation from friends and family, or forced sexual contact. Leave if you feel that violence is going to occur.  If you are attacked or beaten, report it to the police so the abuse is documented. You do not have to press charges. The police can protect you while you or the attackers are leaving. Get the officer's name and badge number and a copy of the report.  Find someone you can trust and tell them what is happening to you: your caregiver, a nurse, clergy member, close friend or family member. Feeling ashamed is natural, but remember that you have done nothing wrong. No one deserves abuse. Document Released:  12/30/1999 Document Revised: 03/26/2011 Document Reviewed: 03/09/2010 ExitCare Patient Information 2013 [employer].    How Much is Too Much Alcohol? Drinking too much alcohol can cause injury, accidents, and health problems. These types of problems can include:   Car crashes.  Falls.  Family fighting (domestic violence).  Drowning.  Fights.  Injuries.  Burns.  Damage to certain organs.  Having a baby with birth defects. ONE DRINK CAN BE TOO MUCH WHEN YOU ARE:  Working.  Pregnant or breastfeeding.  Taking medicines. Ask your doctor.  Driving or planning to drive. If you or someone you know has a drinking problem, get help from a doctor.  Document Released: 10/28/2008 Document Revised: 03/26/2011 Document Reviewed: 10/28/2008 ExitCare Patient Information 2013 [employer].   Smoking Hazards Smoking cigarettes is extremely bad for your health. Tobacco smoke has over 200 known poisons in it. There are over 60 chemicals in tobacco smoke that cause cancer. Some of the chemicals found in cigarette smoke include:   Cyanide.  Benzene.  Formaldehyde.  Methanol (wood alcohol).  Acetylene (fuel used in welding torches).  Ammonia. Cigarette smoke also contains the poisonous gases nitrogen oxide and carbon monoxide.  Cigarette smokers have an increased risk of many serious medical problems and Smoking causes approximately:  90% of all lung cancer deaths in men.  80% of all lung cancer deaths in women.  90% of deaths from chronic obstructive lung disease. Compared with nonsmokers, smoking increases the risk of:  Coronary heart disease by 2 to 4 times.  Stroke by 2 to 4 times.  Men developing lung cancer by 23 times.  Women developing lung cancer by 13 times.  Dying from chronic obstructive lung diseases by 12 times.  . Smoking is the most preventable cause of death and disease in our society.  WHY IS SMOKING ADDICTIVE?  Nicotine is the chemical  agent in tobacco that is capable of causing addiction or dependence.  When you smoke and inhale, nicotine is absorbed rapidly into the bloodstream through your lungs. Nicotine absorbed through the lungs is capable of creating a powerful addiction. Both inhaled and non-inhaled nicotine may be addictive.  Addiction studies of cigarettes and spit tobacco show that addiction to nicotine occurs mainly during the teen years, when young people begin using tobacco products. WHAT ARE THE BENEFITS OF QUITTING?  There are many health benefits to quitting smoking.   Likelihood of developing cancer and heart disease decreases. Health improvements are seen almost immediately.  Blood pressure, pulse rate, and breathing patterns start returning to normal soon after quitting. QUITTING SMOKING   American Lung Association - 1-800-LUNGUSA  American Cancer Society - 1-800-ACS-2345 Document Released: 02/09/2004 Document Revised: 03/26/2011 Document Reviewed: 10/13/2008 ExitCare Patient Information 2013 [employer].   Stress Management Stress is a state of physical or mental tension that often results from changes in your life or normal routine. Some common causes of stress are:  Death of a loved one.  Injuries or severe illnesses.  Getting fired or changing jobs.  Moving into a new home. Other causes may be:  Sexual problems.  Business or financial losses.  Taking on a large debt.  Regular conflict with someone at home or at work.  Constant tiredness from lack of sleep. It is not just bad things that are stressful. It may be stressful to:  Win the lottery.  Get married.  Buy a new car. The amount of stress that can be easily tolerated varies from person to person. Changes generally cause stress, regardless of the types of change. Too much stress can affect your health. It may lead to physical or emotional problems. Too little stress (boredom) may also become stressful. SUGGESTIONS TO  REDUCE STRESS:  Talk things over with your family and friends. It often is helpful to share your concerns and worries. If you feel your problem is serious, you may want to get help from a professional counselor.  Consider your problems one at a time instead of lumping them all together. Trying to take care of everything at once may seem impossible. List all the things you need to do and then start with the most important one. Set a goal to accomplish 2 or 3 things each day. If you expect to do too many in a single day you will naturally fail, causing you to feel even more stressed.  Do not use alcohol or drugs to relieve stress. Although you may feel better for a short time, they do not remove the problems that caused the stress. They can also be habit forming.  Exercise regularly - at least 3 times per week. Physical exercise can help to relieve that "uptight" feeling and will relax you.  The shortest distance between despair and hope is often a good night's sleep.  Go to bed and get up on time allowing yourself time for appointments without being rushed.  Take a short "time-out" period from any stressful situation that occurs during the day. Close your eyes and take some deep breaths. Starting with the muscles in your face, tense them, hold it for a few seconds, then relax. Repeat this with the muscles in your neck, shoulders, hand, stomach, back and legs.  Take good care of yourself. Eat a balanced diet and get plenty of rest.  Schedule time for having fun. Take a break from your daily routine to relax. HOME CARE INSTRUCTIONS   Call if you feel overwhelmed by your problems and feel you can no longer manage them on your own.  Return immediately if you feel like hurting yourself or someone else. Document Released: 06/27/2000 Document Revised: 03/26/2011 Document Reviewed: 02/17/2007 ExitCare Patient Information 2013 [employer].   

## 2012-07-15 NOTE — Progress Notes (Signed)
Reviewed CPRomine, MD

## 2013-07-16 ENCOUNTER — Encounter: Payer: Self-pay | Admitting: Certified Nurse Midwife

## 2013-07-16 ENCOUNTER — Ambulatory Visit (INDEPENDENT_AMBULATORY_CARE_PROVIDER_SITE_OTHER): Payer: BC Managed Care – PPO | Admitting: Certified Nurse Midwife

## 2013-07-16 VITALS — BP 110/72 | HR 68 | Resp 16 | Ht 64.25 in | Wt 242.0 lb

## 2013-07-16 DIAGNOSIS — B3731 Acute candidiasis of vulva and vagina: Secondary | ICD-10-CM

## 2013-07-16 DIAGNOSIS — B373 Candidiasis of vulva and vagina: Secondary | ICD-10-CM

## 2013-07-16 DIAGNOSIS — K900 Celiac disease: Secondary | ICD-10-CM | POA: Insufficient documentation

## 2013-07-16 DIAGNOSIS — Z0000 Encounter for general adult medical examination without abnormal findings: Secondary | ICD-10-CM

## 2013-07-16 DIAGNOSIS — Z01419 Encounter for gynecological examination (general) (routine) without abnormal findings: Secondary | ICD-10-CM

## 2013-07-16 LAB — POCT URINALYSIS DIPSTICK
BILIRUBIN UA: NEGATIVE
Blood, UA: NEGATIVE
Glucose, UA: NEGATIVE
KETONES UA: NEGATIVE
LEUKOCYTES UA: NEGATIVE
NITRITE UA: NEGATIVE
PROTEIN UA: NEGATIVE
Urobilinogen, UA: NEGATIVE
pH, UA: 5

## 2013-07-16 LAB — HEMOGLOBIN, FINGERSTICK: HEMOGLOBIN, FINGERSTICK: 12.2 g/dL (ref 12.0–16.0)

## 2013-07-16 MED ORDER — TERCONAZOLE 0.4 % VA CREA: 1.0000 | TOPICAL_CREAM | Freq: Every day | VAGINAL | Status: DC

## 2013-07-16 NOTE — Patient Instructions (Signed)
EXERCISE AND DIET:  We recommended that you start or continue a regular exercise program for good health. Regular exercise means any activity that makes your heart beat faster and makes you sweat.  We recommend exercising at least 30 minutes per day at least 3 days a week, preferably 4 or 5.  We also recommend a diet low in fat and sugar.  Inactivity, poor dietary choices and obesity can cause diabetes, heart attack, stroke, and kidney damage, among others.    ALCOHOL AND SMOKING:  Women should limit their alcohol intake to no more than 7 drinks/beers/glasses of wine (combined, not each!) per week. Moderation of alcohol intake to this level decreases your risk of breast cancer and liver damage. And of course, no recreational drugs are part of a healthy lifestyle.  And absolutely no smoking or even second hand smoke. Most people know smoking can cause heart and lung diseases, but did you know it also contributes to weakening of your bones? Aging of your skin?  Yellowing of your teeth and nails?  CALCIUM AND VITAMIN D:  Adequate intake of calcium and Vitamin D are recommended.  The recommendations for exact amounts of these supplements seem to change often, but generally speaking 600 mg of calcium (either carbonate or citrate) and 800 units of Vitamin D per day seems prudent. Certain women may benefit from higher intake of Vitamin D.  If you are among these women, your doctor will have told you during your visit.    PAP SMEARS:  Pap smears, to check for cervical cancer or precancers,  have traditionally been done yearly, although recent scientific advances have shown that most women can have pap smears less often.  However, every woman still should have a physical exam from her gynecologist every year. It will include a breast check, inspection of the vulva and vagina to check for abnormal growths or skin changes, a visual exam of the cervix, and then an exam to evaluate the size and shape of the uterus and  ovaries.  And after 37 years of age, a rectal exam is indicated to check for rectal cancers. We will also provide age appropriate advice regarding health maintenance, like when you should have certain vaccines, screening for sexually transmitted diseases, bone density testing, colonoscopy, mammograms, etc.   MAMMOGRAMS:  All women over 40 years old should have a yearly mammogram. Many facilities now offer a "3D" mammogram, which may cost around $50 extra out of pocket. If possible,  we recommend you accept the option to have the 3D mammogram performed.  It both reduces the number of women who will be called back for extra views which then turn out to be normal, and it is better than the routine mammogram at detecting truly abnormal areas.    COLONOSCOPY:  Colonoscopy to screen for colon cancer is recommended for all women at age 50.  We know, you hate the idea of the prep.  We agree, BUT, having colon cancer and not knowing it is worse!!  Colon cancer so often starts as a polyp that can be seen and removed at colonscopy, which can quite literally save your life!  And if your first colonoscopy is normal and you have no family history of colon cancer, most women don't have to have it again for 10 years.  Once every ten years, you can do something that may end up saving your life, right?  We will be happy to help you get it scheduled when you are ready.    Be sure to check your insurance coverage so you understand how much it will cost.  It may be covered as a preventative service at no cost, but you should check your particular policy.     Monilial Vaginitis Vaginitis in a soreness, swelling and redness (inflammation) of the vagina and vulva. Monilial vaginitis is not a sexually transmitted infection. CAUSES  Yeast vaginitis is caused by yeast (candida) that is normally found in your vagina. With a yeast infection, the candida has overgrown in number to a point that upsets the chemical balance. SYMPTOMS    White, thick vaginal discharge.  Swelling, itching, redness and irritation of the vagina and possibly the lips of the vagina (vulva).  Burning or painful urination.  Painful intercourse. DIAGNOSIS  Things that may contribute to monilial vaginitis are:  Postmenopausal and virginal states.  Pregnancy.  Infections.  Being tired, sick or stressed, especially if you had monilial vaginitis in the past.  Diabetes. Good control will help lower the chance.  Birth control pills.  Tight fitting garments.  Using bubble bath, feminine sprays, douches or deodorant tampons.  Taking certain medications that kill germs (antibiotics).  Sporadic recurrence can occur if you become ill. TREATMENT  Your caregiver will give you medication.  There are several kinds of anti monilial vaginal creams and suppositories specific for monilial vaginitis. For recurrent yeast infections, use a suppository or cream in the vagina 2 times a week, or as directed.  Anti-monilial or steroid cream for the itching or irritation of the vulva may also be used. Get your caregiver's permission.  Painting the vagina with methylene blue solution may help if the monilial cream does not work.  Eating yogurt may help prevent monilial vaginitis. HOME CARE INSTRUCTIONS   Finish all medication as prescribed.  Do not have sex until treatment is completed or after your caregiver tells you it is okay.  Take warm sitz baths.  Do not douche.  Do not use tampons, especially scented ones.  Wear cotton underwear.  Avoid tight pants and panty hose.  Tell your sexual partner that you have a yeast infection. They should go to their caregiver if they have symptoms such as mild rash or itching.  Your sexual partner should be treated as well if your infection is difficult to eliminate.  Practice safer sex. Use condoms.  Some vaginal medications cause latex condoms to fail. Vaginal medications that harm condoms  are:  Cleocin cream.  Butoconazole (Femstat).  Terconazole (Terazol) vaginal suppository.  Miconazole (Monistat) (may be purchased over the counter). SEEK MEDICAL CARE IF:   You have a temperature by mouth above 102 F (38.9 C).  The infection is getting worse after 2 days of treatment.  The infection is not getting better after 3 days of treatment.  You develop blisters in or around your vagina.  You develop vaginal bleeding, and it is not your menstrual period.  You have pain when you urinate.  You develop intestinal problems.  You have pain with sexual intercourse. Document Released: 10/11/2004 Document Revised: 03/26/2011 Document Reviewed: 06/25/2008 [facility] Patient Information 2015 [place], [place]. This information is not intended to replace advice given to you by your health care provider. Make sure you discuss any questions you have with your health care provider.
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
## 2013-07-20 NOTE — Progress Notes (Signed)
Reviewed personally.  M. Suzanne Voshon Petro, MD.  

## 2013-11-16 ENCOUNTER — Encounter: Payer: Self-pay | Admitting: Certified Nurse Midwife

## 2014-01-20 ENCOUNTER — Encounter: Payer: Self-pay | Admitting: Certified Nurse Midwife

## 2014-01-28 ENCOUNTER — Ambulatory Visit
Admission: RE | Admit: 2014-01-28 | Discharge: 2014-01-28 | Disposition: A | Discharge status: Home / Self Care | Payer: BC Managed Care – PPO | Source: Ambulatory Visit | Attending: Family Medicine | Admitting: Family Medicine

## 2014-01-28 ENCOUNTER — Other Ambulatory Visit: Payer: Self-pay | Admitting: Family Medicine

## 2014-01-28 DIAGNOSIS — M792 Neuralgia and neuritis, unspecified: Secondary | ICD-10-CM

## 2014-01-28 DIAGNOSIS — M542 Cervicalgia: Secondary | ICD-10-CM

## 2014-03-25 ENCOUNTER — Ambulatory Visit (INDEPENDENT_AMBULATORY_CARE_PROVIDER_SITE_OTHER): Payer: BC Managed Care – PPO | Admitting: Internal Medicine

## 2014-03-25 ENCOUNTER — Encounter: Payer: Self-pay | Admitting: Internal Medicine

## 2014-03-25 ENCOUNTER — Other Ambulatory Visit: Payer: BC Managed Care – PPO

## 2014-03-25 VITALS — BP 162/80 | HR 111 | Ht 64.0 in | Wt 254.0 lb

## 2014-03-25 DIAGNOSIS — L500 Allergic urticaria: Secondary | ICD-10-CM | POA: Insufficient documentation

## 2014-03-25 DIAGNOSIS — K900 Celiac disease: Secondary | ICD-10-CM
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
## 2014-03-25 NOTE — Patient Instructions (Signed)
Order- lab- Allergy profile, Food IgE profile, alpha-gal IgE, Celiac panel , Ibuprofen IgE  Dx Urticaria, allergic  Try suppressing hives with combination antihistamine therapy:  Type 1   Allegra, Zyrtec or Claritin   1 daily Type 2   Pepcid 10 mg or Zantac   1 twice daily

## 2014-03-25 NOTE — Assessment & Plan Note (Signed)
Nonspecific association with hormone cycles, stress, viral infection. Education done. We would like to manage with antihistamines rather than systemic steroids if possible Plan-allergy profiles, recommend H1 plus H2 antihistamine

## 2014-03-25 NOTE — Assessment & Plan Note (Signed)
Diagnosis made during a time of acute symptoms which have now resolved without diet adjustment. Plan-celiac disease panel

## 2014-03-30 ENCOUNTER — Telehealth: Payer: Self-pay | Admitting: Internal Medicine

## 2014-03-30 LAB — ALPHA GAL IGE: Alpha Gal IgE*: <0.1 kU/L (ref <0.35)

## 2014-03-30 NOTE — Telephone Encounter (Signed)
Called spoke w/ pt. She is aware results are not final yet. Will forward to Dr. Joni Reining to make are pt calling for results.

## 2014-03-30 NOTE — Telephone Encounter (Signed)
Called and spoke with pt and she is aware of lab results that are back.  She is aware that once the other labs are back we will call her with those results. Nothing further is needed.

## 2014-03-30 NOTE — Telephone Encounter (Signed)
Alpha-Gal IgE test for meat allergy was negative. Other test results are not yet back.

## 2014-04-08 ENCOUNTER — Telehealth: Payer: Self-pay | Admitting: Internal Medicine

## 2014-04-08 NOTE — Telephone Encounter (Signed)
Spoke with pt. She wants her results from 03/25/14. Advised her that it looks like her results are still pending. She was very unhappy about this and wants to know if CY knows anything about her results.  CY - please advise. Thanks.

## 2014-04-08 NOTE — Telephone Encounter (Signed)
lmtcb x1 

## 2014-04-08 NOTE — Telephone Encounter (Signed)
The alpha-gal IgE test for one pattern of meat allergy was normal. The other results are still pending for allergy profiles and celiac disease. I don't know if these were sent to an outside lab because of her insurance. [name] will know, and we can leave the query open to check with her when she comes back next week.

## 2014-04-08 NOTE — Telephone Encounter (Signed)
Pt is aware of the below information. I will route message to Joellen Jersey to follow up on.

## 2014-04-12 NOTE — Telephone Encounter (Signed)
Labs attached and will need to scanned in EPIC. Thanks.

## 2014-04-13 MED ORDER — PREDNISONE 5 MG PO TABS: ORAL_TABLET | ORAL | Status: DC

## 2014-04-13 NOTE — Progress Notes (Signed)
Quick Note:  Called and spoke to pt and informed her of the results per CY. Pt had concerns about persistent hives and requesting recs from [place]. Phone note generated and sent to CY for recs--see phone note from 04/13/14. Will sign off. ______

## 2014-04-13 NOTE — Telephone Encounter (Signed)
Suggest prednisone 5 mg, # 50   Try 3 daily x 3 days, 2 daily x 3 days, 1 daily x 3 days, then one every other day. If this doesn't control her hives, let us know.

## 2014-04-13 NOTE — Telephone Encounter (Signed)
Called and spoke to pt and informed her of the results per CY. Pt verbalized understanding but is requesting recs to help with the persistent hives. Pt stated she is taking hydroxyzine, pred taper, zyrtec and Zantac and has noticed some relief for a few days without the breakout but then the hives come back. Pt stated she has not been taking Benadryl because of the other medication she is taking and the Benadryl makes her drowsy. Pt stated the hives come at random times but has noticed when she becomes more stressed they seem to worsen. Pt stated she has also noticed when she eats cashews she breaks out more, pt stated she is now avoiding the cashews. Pt has 6 week f/u in May. Pt requesting recs to help with the hives.   CY please advise.     Notes Recorded by Deneise Lever, MD on 04/13/2014 at 9:00 AM Allergy antibody levels were low, in the normal range for everything we tested: Foods, including the celiac/gliadin test panel, common environmental triggers (dust, pollens etc), and also for ibuprofen. There may be some slow/ mild reactions by other pathways that we can't test for, but our results make fast or dangerous type reactions unlikely.

## 2014-04-13 NOTE — Telephone Encounter (Signed)
Pt is aware of CY's recommendation. Rx has been sent in. Nothing further was needed. 

## 2014-04-29 ENCOUNTER — Telehealth: Payer: Self-pay | Admitting: Certified Nurse Midwife

## 2014-04-29 NOTE — Telephone Encounter (Signed)
Spoke with patient. She has had hives for four months and seen PCP, Dermatology and pulmonology. Hives are only worsening despite prednisone, Zantac and multiple creams offered by specialists. Patient is calling to ask if Regina Eck CNM has any advise about her current treatment and if she has any thoughts about hives being related to hormone issues. Patient states she is just trying to research all of her options as current treatments are not working. Reviewed medication list with patient, she is not on any additional medications.   Patient denies any emergent symptoms right now, asking for advise and review from provider.  Advised would place request and return call.

## 2014-04-29 NOTE — Telephone Encounter (Signed)
What about anxiety related hives? This occurs quite frequently. She may want to start oral probiotic to help her immune system and discuss with PCP regarding anxiety related issue with this.

## 2014-04-29 NOTE — Telephone Encounter (Signed)
Pt is breaking out in hives all over and not sure what is going on. Please call to advise.

## 2014-04-30 ENCOUNTER — Encounter: Payer: Self-pay | Admitting: Internal Medicine

## 2014-04-30 NOTE — Telephone Encounter (Signed)
Message from [place] given via voicemail message. Okay to leave detailed message per designated party release form.  Advised if any questions to return call to office. Routing to provider for final review. Patient agreeable to disposition. Will close encounter

## 2014-05-05 ENCOUNTER — Other Ambulatory Visit: Payer: Self-pay | Admitting: Dermatology

## 2014-05-27 ENCOUNTER — Encounter (INDEPENDENT_AMBULATORY_CARE_PROVIDER_SITE_OTHER): Payer: Self-pay

## 2014-05-27 ENCOUNTER — Ambulatory Visit (INDEPENDENT_AMBULATORY_CARE_PROVIDER_SITE_OTHER): Payer: BC Managed Care – PPO | Admitting: Internal Medicine

## 2014-05-27 ENCOUNTER — Encounter: Payer: Self-pay | Admitting: Internal Medicine

## 2014-05-27 VITALS — BP 120/88 | HR 89 | Ht 64.0 in | Wt 252.0 lb

## 2014-05-27 DIAGNOSIS — K900 Celiac disease: Secondary | ICD-10-CM

## 2014-05-27 DIAGNOSIS — L500 Allergic urticaria: Secondary | ICD-10-CM | POA: Diagnosis not present

## 2014-05-27 MED ORDER — DOXEPIN HCL 150 MG PO CAPS: ORAL_CAPSULE | ORAL | Status: DC

## 2014-05-27 NOTE — Progress Notes (Signed)
03/25/14- 38 yoF nonsmoker referred by Dr Wynetta Emery at [facility] suggested patient come to allergist. Has been having unexplained hives since November 2015. Has not had any allergy lab work,etc(MD thought it could be hydrocodone). Has been dx'd celiac disease but not treating it as she has recently adopted 2 boys. Currently on Hydroxyzine 45m and Prednisone taper. Onset of hives off and on over past 4 months. First onset in November 2015, responded to cortisone. Another episode 1 month ago treated with Atarax and prednisone. Current episode began about 2 weeks ago and she is now on prednisone after cortisone shot. 2 occasions began while she was on hydrocodone for back pain. 2 episodes began during menses. In the last 2 or 3 years has been more aware of allergic rhinitis symptoms associated with pollens and dust. During an interval of GI upset she had colonoscopy and was diagnosed as borderline celiac disease. Symptoms have gone away without diet restriction some greasy food intolerance but no other suspected food interaction. 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 Lives with husband in a house with dog, gas heat, no mold.  5/122/16- [age]yoF followed for allergy / urticaria, complicated by celiac disease Follows For: Pt c/o hives on lower legs, right arm, stomach, and neck area. Happening 6 times in 04/2014. Has noticed it has become more frequent.  She associates hives with stress related teaching school, caring for her mother and her adopted child. Notices cycle every 2 or 3 weeks of generalized hives lasting between 2 and 7 days. Not hormone related. Alpha-gal NEG Food IgE 03/25/14 Neg Allergy profile Neg Celiac panel Neg  ROS-see HPI   Negative unless "+" Constitutional:    weight loss, night sweats, fevers, chills, fatigue, lassitude. HEENT:    +headaches, difficulty swallowing, tooth/dental problems, sore  throat,       sneezing, +itching, ear ache, nasal congestion, post nasal drip, snoring CV:    chest pain, orthopnea, PND, swelling in lower extremities, anasarca,                                  dizziness, palpitations Resp:   +shortness of breath with exertion or at rest.                productive cough,   non-productive cough, coughing up of blood.              change in color of mucus.  wheezing.   Skin:    +rash or lesions. GI:  + heartburn,+ indigestion, abdominal pain, nausea, vomiting, diarrhea,                 change in bowel habits, loss of appetite GU: dysuria, change in color of urine, no urgency or frequency.   flank pain. MS:   joint pain, stiffness, decreased range of motion, back pain. Neuro-     nothing unusual Psych:  change in mood or affect.  depression or anxiety.   memory loss.  OBJ- Physical Exam General- Alert, Oriented, Affect-appropriate, Distress- none acute Skin- hives on her arms Lymphadenopathy- none Head- atraumatic            Eyes- Gross vision intact, PERRLA, conjunctivae and secretions clear            Ears- Hearing, canals-normal            Nose-  Clear, no-Septal dev, mucus, polyps, erosion, perforation             Throat- Mallampati II , mucosa clear , drainage- none, tonsils- atrophic Neck- flexible , trachea midline, no stridor , thyroid nl, carotid no bruit Chest - symmetrical excursion , unlabored           Heart/CV- RRR , no murmur , no gallop  , no rub, nl s1 s2                           - JVD- none , edema- none, stasis changes- none, varices- none           Lung- clear to P&A, wheeze- none, cough- none , dullness-none, rub- none           Chest wall-  Abd-  Br/ Gen/ Rectal- Not done, not indicated Extrem- cyanosis- none, clubbing, none, atrophy- none, strength- nl Neuro- grossly intact to observation

## 2014-05-27 NOTE — Patient Instructions (Signed)
We are trying doxepin for its antihistamine effect. Script sent  Please call as needed

## 2014-06-14 NOTE — Assessment & Plan Note (Signed)
I don't have laboratory basis for restricting gluten exposure. If she associates symptoms with gluten in her diet then GI bowel evaluation may be appropriate

## 2014-06-14 NOTE — Assessment & Plan Note (Signed)
She may be correct in associating her urticaria with "stress". We discussed how to recognize that pattern. Plan-try doxepin, both his antihistamine and for potential mood benefit

## 2014-07-15 ENCOUNTER — Other Ambulatory Visit: Payer: Self-pay | Admitting: Specialist

## 2014-07-20 ENCOUNTER — Ambulatory Visit (INDEPENDENT_AMBULATORY_CARE_PROVIDER_SITE_OTHER): Payer: BC Managed Care – PPO | Admitting: Certified Nurse Midwife

## 2014-07-20 ENCOUNTER — Encounter: Payer: Self-pay | Admitting: Certified Nurse Midwife

## 2014-07-20 VITALS — BP 122/82 | HR 78 | Resp 14 | Ht 64.25 in | Wt 249.6 lb

## 2014-07-20 DIAGNOSIS — Z01419 Encounter for gynecological examination (general) (routine) without abnormal findings: Secondary | ICD-10-CM | POA: Diagnosis not present

## 2014-07-20 DIAGNOSIS — R350 Frequency of micturition: Secondary | ICD-10-CM

## 2014-07-20 DIAGNOSIS — Z0000 Encounter for general adult medical examination without abnormal findings: Secondary | ICD-10-CM

## 2014-07-20 DIAGNOSIS — Z124 Encounter for screening for malignant neoplasm of cervix: Secondary | ICD-10-CM | POA: Diagnosis not present

## 2014-07-20 DIAGNOSIS — N852 Hypertrophy of uterus: Secondary | ICD-10-CM

## 2014-07-20 LAB — POCT URINALYSIS DIPSTICK
UROBILINOGEN UA: NEGATIVE
pH, UA: 5

## 2014-07-20 NOTE — Patient Instructions (Signed)
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
## 2014-07-21 ENCOUNTER — Encounter: Payer: Self-pay | Admitting: Certified Nurse Midwife

## 2014-07-21 LAB — URINALYSIS, MICROSCOPIC ONLY
CASTS: NONE SEEN
CRYSTALS: NONE SEEN

## 2014-07-21 LAB — IPS PAP TEST WITH REFLEX TO HPV

## 2014-07-21 NOTE — Progress Notes (Signed)
Encounter reviewed by Dr. Brook Amundson C. Silva.  

## 2014-07-22 ENCOUNTER — Ambulatory Visit (INDEPENDENT_AMBULATORY_CARE_PROVIDER_SITE_OTHER): Payer: BC Managed Care – PPO | Admitting: Certified Nurse Midwife

## 2014-07-22 ENCOUNTER — Encounter: Payer: Self-pay | Admitting: Certified Nurse Midwife

## 2014-07-22 ENCOUNTER — Telehealth: Payer: Self-pay

## 2014-07-22 VITALS — BP 114/76 | HR 74 | Resp 20 | Ht 64.25 in | Wt 251.0 lb

## 2014-07-22 DIAGNOSIS — A499 Bacterial infection, unspecified: Secondary | ICD-10-CM | POA: Diagnosis not present

## 2014-07-22 DIAGNOSIS — N760 Acute vaginitis: Secondary | ICD-10-CM | POA: Diagnosis not present

## 2014-07-22 DIAGNOSIS — B9689 Other specified bacterial agents as the cause of diseases classified elsewhere: Secondary | ICD-10-CM

## 2014-07-22 LAB — URINE CULTURE

## 2014-07-22 NOTE — Progress Notes (Signed)
Reviewed personally.  M. Suzanne Annell Canty, MD.  

## 2014-07-22 NOTE — Telephone Encounter (Signed)
Patient notified of results. See lab 

## 2014-07-22 NOTE — Progress Notes (Signed)
39 y.o. Married Caucasian female G0P0000 here for follow up urine culture collected at [facility] 2 days ago which showed 70,000 multibacteria none predominant. Patient was notified of result and has noted increase vaginal discharge with slight irritation. No vaginal itching or burning. Patient was asked to come in to screen vaginally for bacteria source. No new personal products or spouse with symptoms. Urinary frequency is still occasional. No health issues today.   O: Healthy WD,WN female Affect: normal Skin: warm and dry Abdomen:soft non tender Pelvic exam:EXTERNAL GENITALIA: normal appearing vulva with no masses, tenderness or lesions, slight increase pink on vulva,no scaling or exudate VAGINA: no abnormal discharge or lesions, but odor noted, Affirm taken CERVIX: no lesions or cervical motion tenderness, normal appearance Pelvic exam deferred due to recently done with [facility]  A: R/O vaginal infection Urine culture positive for large colonization of multibacteria, none predominant.  P: Discussed findings of vaginal discharge. Await affirm results will treat if indicated, if negative, no further evaluation needed.   RV prn

## 2014-07-22 NOTE — Telephone Encounter (Signed)
lmtcb

## 2014-07-22 NOTE — Telephone Encounter (Signed)
-----   Message from Regina Eck, CNM sent at 07/22/2014  8:22 AM EDT ----- Notify patient that urine micro had few bacteria, culture showed multiple bacteria,no predominant, but high number, which is probably vaginal origin. Needs OV for affirm please schedule

## 2014-07-23 LAB — WET PREP BY MOLECULAR PROBE
Candida species: NEGATIVE
Gardnerella vaginalis: NEGATIVE
TRICHOMONAS VAG: NEGATIVE

## 2014-07-29 ENCOUNTER — Ambulatory Visit (INDEPENDENT_AMBULATORY_CARE_PROVIDER_SITE_OTHER): Payer: BC Managed Care – PPO

## 2014-07-29 ENCOUNTER — Ambulatory Visit (INDEPENDENT_AMBULATORY_CARE_PROVIDER_SITE_OTHER): Payer: BC Managed Care – PPO | Admitting: Obstetrics & Gynecology

## 2014-07-29 VITALS — BP 112/78 | HR 72 | Wt 249.0 lb

## 2014-07-29 DIAGNOSIS — N852 Hypertrophy of uterus: Secondary | ICD-10-CM

## 2014-07-29 NOTE — Progress Notes (Signed)
39 y.o. G0 (adopted 3) Marriedfemale here for a pelvic ultrasound due to enlarged uterus noted on physical exam with Melvia Heaps at [facility] in July 5th.  Pt reports cycles are regular and that flow has increased some over the past few years.  No intermenstrual bleeding.   Patient's last menstrual period was 07/05/2014.  Sexually active:  yes  Contraception: no method , infertility FINDINGS: UTERUS: 8.2 x 4.5 x 4.0cm with 2.8cm right fundal fibroid EMS: 8.70mm, pt starting cycle today ADNEXA:   Left ovary 3.1 x 1.4 x 1.9cm with 2.0cm collapsing corpus luteal cyst   Right ovary 2.2 x 1.7 x 2.2cm CUL DE SAC: no free fluid seen  Images reviewed with pt and finding of fibroid noted.  Pt has many, many questions regarding fibroids.  ACOG bulletin on fibroids reviewed with pt and provided for reference.  Pt understands will follow on physical exam and repeat if exam changes and/or pt has future changes with bleeding.  All questions answered.    Assessment:  Intramural fibroid, collapsing left ovarian cyst Plan: follow up with [facility] next year with Melvia Heaps, as scheduled.  ~20 minutes spent with patient >50% of time was in face to face discussion of above.

## 2014-07-31 ENCOUNTER — Encounter: Payer: Self-pay | Admitting: Obstetrics & Gynecology

## 2014-08-19 ENCOUNTER — Ambulatory Visit: Payer: BC Managed Care – PPO | Admitting: Internal Medicine

## 2014-12-23 ENCOUNTER — Ambulatory Visit (INDEPENDENT_AMBULATORY_CARE_PROVIDER_SITE_OTHER): Payer: BC Managed Care – PPO | Admitting: Obstetrics and Gynecology

## 2014-12-23 ENCOUNTER — Encounter: Payer: Self-pay | Admitting: Obstetrics and Gynecology

## 2014-12-23 VITALS — BP 136/88 | HR 70 | Temp 98.3°F | Resp 14 | Ht 64.25 in | Wt 256.0 lb

## 2014-12-23 DIAGNOSIS — N760 Acute vaginitis: Secondary | ICD-10-CM | POA: Diagnosis not present

## 2014-12-23 DIAGNOSIS — R319 Hematuria, unspecified: Secondary | ICD-10-CM | POA: Diagnosis not present

## 2014-12-23 DIAGNOSIS — N9089 Other specified noninflammatory disorders of vulva and perineum: Secondary | ICD-10-CM

## 2014-12-23 DIAGNOSIS — R109 Unspecified abdominal pain: Secondary | ICD-10-CM | POA: Diagnosis not present

## 2014-12-23 DIAGNOSIS — N3945 Continuous leakage: Secondary | ICD-10-CM | POA: Diagnosis not present

## 2014-12-23 LAB — POCT URINALYSIS DIPSTICK
Bilirubin, UA: NEGATIVE
Glucose, UA: NEGATIVE
Ketones, UA: NEGATIVE
Nitrite, UA: NEGATIVE
PH UA: 5
Protein, UA: NEGATIVE
Urobilinogen, UA: NEGATIVE

## 2014-12-23 LAB — WET PREP BY MOLECULAR PROBE
Candida species: NEGATIVE
Gardnerella vaginalis: NEGATIVE
Trichomonas vaginosis: NEGATIVE

## 2014-12-23 LAB — URINALYSIS, MICROSCOPIC ONLY
Bacteria, UA: NONE SEEN [HPF]
CRYSTALS: NONE SEEN [HPF]
Casts: NONE SEEN [LPF]
Yeast: NONE SEEN [HPF]

## 2014-12-23 MED ORDER — SULFAMETHOXAZOLE-TRIMETHOPRIM 800-160 MG PO TABS: 1.0000 | ORAL_TABLET | Freq: Two times a day (BID) | ORAL | Status: DC

## 2014-12-23 MED ORDER — VALACYCLOVIR HCL 1 G PO TABS: 1000.0000 mg | ORAL_TABLET | Freq: Two times a day (BID) | ORAL | Status: DC
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
## 2014-12-24 ENCOUNTER — Telehealth: Payer: Self-pay

## 2014-12-24 LAB — URINE CULTURE: Colony Count: 85000

## 2014-12-24 NOTE — Telephone Encounter (Signed)
-----   Message from Nunzio Cobbs, MD sent at 12/24/2014  4:23 AM EST ----- Please inform patient of negative Affirm testing.  HSV culture and urine culture are still pending.   Cc- Marisa Sprinkles

## 2014-12-24 NOTE — Telephone Encounter (Signed)
Spoke with patient. Results given as seen below from [place]. Patient verbalizes understanding.  Routing to provider for final review. Patient agreeable to disposition. Will close encounter.

## 2014-12-27 LAB — HERPES SIMPLEX VIRUS CULTURE: ORGANISM ID, BACTERIA: NOT DETECTED

## 2014-12-28 ENCOUNTER — Telehealth: Payer: Self-pay

## 2014-12-28 NOTE — Telephone Encounter (Signed)
Left message to call Srijan Givan at [phone]. 

## 2014-12-28 NOTE — Telephone Encounter (Signed)
-----   Message from Nunzio Cobbs, MD sent at 12/24/2014  5:32 PM EST ----- Please inform patient of negative urine culture.  She was not having clear UTI symptoms.  HSV culture is pending.   Cc- Marisa Sprinkles

## 2014-12-28 NOTE — Telephone Encounter (Signed)
Returning a call to Kaitlyn. °

## 2014-12-28 NOTE — Telephone Encounter (Signed)
Spoke with patient. All results given as seen below. Patient verbalizes understanding. Patient states she is on the 4th day of taking Valtrex 1000 mg twice per day. States all "areas" have cleared except for a "cold sore" on her lip. Patient is asking if she needs to continue to take the remainder of the medication or if she is okay to discontinue at this time. Advised will speak with Dr.Silva and return call with further recommendations.  Notes Recorded by Nunzio Cobbs, MD on 12/28/2014 at 9:27 AM Please let patient know that she had a negative HSV culture result.  Cc- Marisa Sprinkles

## 2014-12-28 NOTE — Telephone Encounter (Signed)
Left message to call Goddess Gebbia at [phone]. 

## 2014-12-28 NOTE — Telephone Encounter (Signed)
Increase Valtrex to 2000 mg po q 12 hours for one day, and then may stop the Valtrex.  If patient is having a lot of recurrences of the labial herpes, she may take the Valtrex 500 mg daily for prevention.   Thanks.

## 2014-12-29 NOTE — Telephone Encounter (Signed)
Patient returned your call [phone].

## 2014-12-29 NOTE — Telephone Encounter (Signed)
Left message to call Kaitlyn at [phone]. 

## 2014-12-29 NOTE — Telephone Encounter (Signed)
Patient returning your call.

## 2014-12-30 NOTE — Telephone Encounter (Signed)
Spoke with patient. Advised of message as seen below from Heeia. Patient is agreeable and verbalizes understanding.  Routing to provider for final review. Patient agreeable to disposition. Will close encounter.

## 2015-01-03 ENCOUNTER — Ambulatory Visit: Payer: BC Managed Care – PPO | Admitting: Obstetrics & Gynecology

## 2015-07-21 ENCOUNTER — Encounter: Payer: Self-pay | Admitting: Certified Nurse Midwife

## 2015-07-21 ENCOUNTER — Ambulatory Visit (INDEPENDENT_AMBULATORY_CARE_PROVIDER_SITE_OTHER): Payer: BC Managed Care – PPO | Admitting: Certified Nurse Midwife

## 2015-07-21 VITALS — BP 118/72 | HR 72 | Resp 16 | Ht 63.75 in | Wt 255.0 lb

## 2015-07-21 DIAGNOSIS — Z01419 Encounter for gynecological examination (general) (routine) without abnormal findings: Secondary | ICD-10-CM

## 2015-07-21 DIAGNOSIS — D259 Leiomyoma of uterus, unspecified: Secondary | ICD-10-CM | POA: Diagnosis not present

## 2015-07-21 DIAGNOSIS — L298 Other pruritus: Secondary | ICD-10-CM

## 2015-07-21 DIAGNOSIS — B009 Herpesviral infection, unspecified: Secondary | ICD-10-CM | POA: Diagnosis not present

## 2015-07-21 DIAGNOSIS — N898 Other specified noninflammatory disorders of vagina: Secondary | ICD-10-CM

## 2015-07-21 MED ORDER — VALACYCLOVIR HCL 1 G PO TABS: 1000.0000 mg | ORAL_TABLET | Freq: Two times a day (BID) | ORAL | Status: DC

## 2015-07-21 NOTE — Patient Instructions (Signed)

## 2015-07-21 NOTE — Progress Notes (Signed)
Please advise the patient of normal results.
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
## 2015-07-22 LAB — WET PREP BY MOLECULAR PROBE
CANDIDA SPECIES: NEGATIVE
Gardnerella vaginalis: NEGATIVE
Trichomonas vaginosis: NEGATIVE

## 2015-08-30 ENCOUNTER — Encounter (HOSPITAL_COMMUNITY): Payer: Self-pay

## 2015-09-14 ENCOUNTER — Encounter (INDEPENDENT_AMBULATORY_CARE_PROVIDER_SITE_OTHER): Payer: Self-pay

## 2015-09-14 ENCOUNTER — Encounter (HOSPITAL_COMMUNITY): Payer: Self-pay | Admitting: Licensed Clinical Social Worker

## 2015-09-14 ENCOUNTER — Ambulatory Visit (INDEPENDENT_AMBULATORY_CARE_PROVIDER_SITE_OTHER): Payer: BC Managed Care – PPO | Admitting: Licensed Clinical Social Worker

## 2015-09-14 DIAGNOSIS — F418 Other specified anxiety disorders: Secondary | ICD-10-CM | POA: Diagnosis not present
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
## 2015-10-17 ENCOUNTER — Ambulatory Visit (HOSPITAL_COMMUNITY): Payer: Self-pay | Admitting: Licensed Clinical Social Worker

## 2016-02-11 DIAGNOSIS — G43109 Migraine with aura, not intractable, without status migrainosus: Secondary | ICD-10-CM | POA: Insufficient documentation

## 2016-02-11 DIAGNOSIS — I10 Essential (primary) hypertension: Secondary | ICD-10-CM | POA: Insufficient documentation

## 2016-02-13 ENCOUNTER — Other Ambulatory Visit: Payer: Self-pay | Admitting: Neurology

## 2016-02-13 DIAGNOSIS — R51 Headache: Principal | ICD-10-CM

## 2016-02-13 DIAGNOSIS — R519 Headache, unspecified: Secondary | ICD-10-CM

## 2016-02-15 ENCOUNTER — Ambulatory Visit
Admission: RE | Admit: 2016-02-15 | Discharge: 2016-02-15 | Disposition: A | Discharge status: Home / Self Care | Payer: BC Managed Care – PPO | Source: Ambulatory Visit | Attending: Neurology | Admitting: Neurology

## 2016-02-15 DIAGNOSIS — R51 Headache: Principal | ICD-10-CM

## 2016-02-15 DIAGNOSIS — R519 Headache, unspecified: Secondary | ICD-10-CM

## 2016-02-24 ENCOUNTER — Other Ambulatory Visit: Payer: Self-pay | Admitting: Neurology

## 2016-02-24 DIAGNOSIS — I639 Cerebral infarction, unspecified: Secondary | ICD-10-CM

## 2016-03-02 ENCOUNTER — Ambulatory Visit
Admission: RE | Admit: 2016-03-02 | Discharge: 2016-03-02 | Disposition: A | Discharge status: Home / Self Care | Payer: BC Managed Care – PPO | Source: Ambulatory Visit | Attending: Neurology | Admitting: Neurology

## 2016-03-02 DIAGNOSIS — I639 Cerebral infarction, unspecified: Secondary | ICD-10-CM

## 2016-03-02 MED ORDER — GADOBENATE DIMEGLUMINE 529 MG/ML IV SOLN
20.0000 mL | Freq: Once | INTRAVENOUS | Status: AC | PRN
  Administered 2016-03-02: 20 mL via INTRAVENOUS

## 2016-03-14 ENCOUNTER — Telehealth: Payer: Self-pay | Admitting: Cardiology

## 2016-03-14 NOTE — Telephone Encounter (Signed)
03/20/2016 Received faxed referral packet from [facility] for upcoming appointment with Dr. Stanford Breed.  Records given to [facility].  cbr
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
## 2016-03-20 ENCOUNTER — Encounter: Payer: Self-pay | Admitting: *Deleted

## 2016-03-20 ENCOUNTER — Ambulatory Visit (INDEPENDENT_AMBULATORY_CARE_PROVIDER_SITE_OTHER): Payer: BC Managed Care – PPO | Admitting: Cardiology

## 2016-03-20 ENCOUNTER — Encounter: Payer: Self-pay | Admitting: Cardiology

## 2016-03-20 VITALS — BP 100/60 | HR 59 | Ht 64.0 in | Wt 253.0 lb

## 2016-03-20 DIAGNOSIS — I639 Cerebral infarction, unspecified: Secondary | ICD-10-CM

## 2016-03-20 DIAGNOSIS — R0683 Snoring: Secondary | ICD-10-CM | POA: Insufficient documentation

## 2016-03-20 NOTE — Patient Instructions (Signed)
Your physician has requested that you have a TEE. During a TEE, sound waves are used to create images of your heart. It provides your doctor with information about the size and shape of your heart and how well your heart's chambers and valves are working. In this test, a transducer is attached to the end of a flexible tube that's guided down your throat and into your esophagus (the tube leading from you mouth to your stomach) to get a more detailed image of your heart. You are not awake for the procedure. Please see the instruction sheet given to you today. For further information please visit [email or web address].    Transesophageal Echocardiogram Transesophageal echocardiography (TEE) is a picture test of your heart using sound waves. The pictures taken can give very detailed pictures of your heart. This can help your doctor see if there are problems with your heart. TEE can check:  If your heart has blood clots in it.  How well your heart valves are working.  If you have an infection on the inside of your heart.  Some of the major arteries of your heart.  If your heart valve is working after a Office manager.  Your heart before a procedure that uses a shock to your heart to get the rhythm back to normal. What happens before the procedure?  Do not eat or drink for 6 hours before the procedure or as told by your doctor.  Make plans to have someone drive you home after the procedure. Do not drive yourself home.  An IV tube will be put in your arm. What happens during the procedure?  You will be given a medicine to help you relax (sedative). It will be given through the IV tube.  A numbing medicine will be sprayed or gargled in the back of your throat to help numb it.  The tip of the probe is placed into the back of your mouth. You will be asked to swallow. This helps to pass the probe into your esophagus.  Once the tip of the probe is in the right place, your doctor can take pictures of your  heart.  You may feel pressure at the back of your throat. What happens after the procedure?  You will be taken to a recovery area so the sedative can wear off.  Your throat may be sore and scratchy. This will go away slowly over time.  You will go home when you are fully awake and able to swallow liquids.  You should have someone stay with you for the next 24 hours.  Do not drive or operate machinery for the next 24 hours. This information is not intended to replace advice given to you by your health care provider. Make sure you discuss any questions you have with your health care provider. Document Released: 10/29/2008 Document Revised: 06/09/2015 Document Reviewed: 07/03/2012 Elsevier Interactive Patient Education  2017 [facility] physician recommends that you schedule a follow-up appointment in: AS NEEDED

## 2016-03-26 ENCOUNTER — Encounter (HOSPITAL_COMMUNITY): Payer: Self-pay | Admitting: *Deleted

## 2016-03-26 ENCOUNTER — Ambulatory Visit (HOSPITAL_BASED_OUTPATIENT_CLINIC_OR_DEPARTMENT_OTHER): Payer: BC Managed Care – PPO

## 2016-03-26 ENCOUNTER — Ambulatory Visit (HOSPITAL_COMMUNITY)
Admission: RE | Admit: 2016-03-26 | Discharge: 2016-03-26 | Disposition: A | Discharge status: Home / Self Care | Payer: BC Managed Care – PPO | Source: Ambulatory Visit | Attending: Cardiology | Admitting: Cardiology

## 2016-03-26 ENCOUNTER — Encounter (HOSPITAL_COMMUNITY)
Admission: RE | Disposition: A | Discharge status: Home / Self Care | Payer: Self-pay | Source: Ambulatory Visit | Attending: Cardiology

## 2016-03-26 DIAGNOSIS — I639 Cerebral infarction, unspecified: Secondary | ICD-10-CM

## 2016-03-26 DIAGNOSIS — F419 Anxiety disorder, unspecified: Secondary | ICD-10-CM | POA: Diagnosis not present

## 2016-03-26 DIAGNOSIS — I340 Nonrheumatic mitral (valve) insufficiency: Secondary | ICD-10-CM

## 2016-03-26 DIAGNOSIS — Z79899 Other long term (current) drug therapy: Secondary | ICD-10-CM | POA: Diagnosis not present

## 2016-03-26 DIAGNOSIS — R0600 Dyspnea, unspecified: Secondary | ICD-10-CM | POA: Insufficient documentation

## 2016-03-26 DIAGNOSIS — I10 Essential (primary) hypertension: Secondary | ICD-10-CM | POA: Insufficient documentation

## 2016-03-26 DIAGNOSIS — Z823 Family history of stroke: Secondary | ICD-10-CM | POA: Diagnosis not present

## 2016-03-26 HISTORY — PX: TEE WITHOUT CARDIOVERSION: SHX5443

## 2016-03-26 SURGERY — ECHOCARDIOGRAM, TRANSESOPHAGEAL
Anesthesia: Moderate Sedation

## 2016-03-26 MED ORDER — FENTANYL CITRATE (PF) 100 MCG/2ML IJ SOLN
INTRAMUSCULAR | Status: AC
  Filled 2016-03-26: qty 2

## 2016-03-26 MED ORDER — BUTAMBEN-TETRACAINE-BENZOCAINE 2-2-14 % EX AERO
INHALATION_SPRAY | CUTANEOUS | Status: DC | PRN
  Administered 2016-03-26: 2 via TOPICAL

## 2016-03-26 MED ORDER — SODIUM CHLORIDE 0.9 % IV SOLN
INTRAVENOUS | Status: DC
  Administered 2016-03-26: 500 mL via INTRAVENOUS

## 2016-03-26 MED ORDER — FENTANYL CITRATE (PF) 100 MCG/2ML IJ SOLN
INTRAMUSCULAR | Status: DC | PRN
  Administered 2016-03-26 (×2): 25 ug via INTRAVENOUS

## 2016-03-26 MED ORDER — DIPHENHYDRAMINE HCL 50 MG/ML IJ SOLN
INTRAMUSCULAR | Status: AC
  Filled 2016-03-26: qty 1

## 2016-03-26 MED ORDER — MIDAZOLAM HCL 10 MG/2ML IJ SOLN
INTRAMUSCULAR | Status: DC | PRN
  Administered 2016-03-26 (×3): 2 mg via INTRAVENOUS

## 2016-03-26 MED ORDER — MIDAZOLAM HCL 5 MG/ML IJ SOLN
INTRAMUSCULAR | Status: AC
  Filled 2016-03-26: qty 2
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
## 2016-03-26 NOTE — Interval H&P Note (Signed)
History and Physical Interval Note:  03/26/2016 11:07 AM  [name]  has presented today for surgery, with the diagnosis of CDA  The various methods of treatment have been discussed with the patient and family. After consideration of risks, benefits and other options for treatment, the patient has consented to  Procedure(s): TRANSESOPHAGEAL ECHOCARDIOGRAM (TEE) (N/A) as a surgical intervention .  The patient's history has been reviewed, patient examined, no change in status, stable for surgery.  I have reviewed the patient's chart and labs.  Questions were answered to the patient's satisfaction.     Kirk Ruths

## 2016-03-26 NOTE — CV Procedure (Signed)
    Transesophageal Echocardiogram Note  [name] [id number] [birth year]  Procedure: Transesophageal Echocardiogram Indications: TIA   Procedure Details Consent: Obtained Time Out: Verified patient identification, verified procedure, site/side was marked, verified correct patient position, special equipment/implants available, Radiology Safety Procedures followed,  medications/allergies/relevent history reviewed, required imaging and test results available.  Performed  Medications:  During this procedure the patient is administered a total of Versed 6 mg and Fentanyl 50 mcg  to achieve and maintain moderate conscious sedation.  The patient's heart rate, blood pressure, and oxygen saturation are monitored continuously during the procedure. The period of conscious sedation is 30 minutes, of which I was present face-to-face 100% of this time.   Normal LV function; no LAA thrombus; mild MR; negative saline microcavitation study.  Complications: No apparent complications Patient did tolerate procedure well.  Kirk Ruths, MD

## 2016-03-26 NOTE — Discharge Instructions (Signed)

## 2016-03-26 NOTE — Progress Notes (Signed)
  Echocardiogram Echocardiogram Transesophageal has been performed.  [name] 03/26/2016, 11:33 AM

## 2016-03-26 NOTE — Interval H&P Note (Signed)
History and Physical Interval Note:  03/26/2016 11:00 AM  [name]  has presented today for surgery, with the diagnosis of CDA  The various methods of treatment have been discussed with the patient and family. After consideration of risks, benefits and other options for treatment, the patient has consented to  Procedure(s): TRANSESOPHAGEAL ECHOCARDIOGRAM (TEE) (N/A) as a surgical intervention .  The patient's history has been reviewed, patient examined, no change in status, stable for surgery.  I have reviewed the patient's chart and labs.  Questions were answered to the patient's satisfaction.     Kirk Ruths

## 2016-03-29 ENCOUNTER — Telehealth: Payer: Self-pay | Admitting: Cardiology

## 2016-03-29 NOTE — Telephone Encounter (Signed)
New Message      Pt would like to go over her TEE results, please call

## 2016-03-29 NOTE — Telephone Encounter (Signed)
Spoke with pt, aware TEE was normal.

## 2016-05-30 DIAGNOSIS — G4733 Obstructive sleep apnea (adult) (pediatric): Secondary | ICD-10-CM | POA: Insufficient documentation

## 2016-07-24 ENCOUNTER — Encounter: Payer: Self-pay | Admitting: Certified Nurse Midwife

## 2016-07-24 ENCOUNTER — Ambulatory Visit (INDEPENDENT_AMBULATORY_CARE_PROVIDER_SITE_OTHER): Payer: BC Managed Care – PPO | Admitting: Certified Nurse Midwife

## 2016-07-24 VITALS — BP 116/68 | HR 68 | Resp 16 | Ht 64.0 in | Wt 261.0 lb

## 2016-07-24 DIAGNOSIS — Z8673 Personal history of transient ischemic attack (TIA), and cerebral infarction without residual deficits: Secondary | ICD-10-CM

## 2016-07-24 DIAGNOSIS — Z8669 Personal history of other diseases of the nervous system and sense organs: Secondary | ICD-10-CM

## 2016-07-24 DIAGNOSIS — Z01419 Encounter for gynecological examination (general) (routine) without abnormal findings: Secondary | ICD-10-CM
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
## 2016-07-24 NOTE — Patient Instructions (Signed)

## 2016-09-25 DIAGNOSIS — D509 Iron deficiency anemia, unspecified: Secondary | ICD-10-CM | POA: Insufficient documentation

## 2016-09-25 DIAGNOSIS — R748 Abnormal levels of other serum enzymes: Secondary | ICD-10-CM | POA: Insufficient documentation

## 2016-09-25 DIAGNOSIS — Z6841 Body Mass Index (BMI) 40.0 and over, adult: Secondary | ICD-10-CM | POA: Insufficient documentation

## 2016-10-15 ENCOUNTER — Encounter: Payer: Self-pay | Admitting: Certified Nurse Midwife

## 2016-10-17 ENCOUNTER — Telehealth: Payer: Self-pay | Admitting: *Deleted

## 2016-10-17 NOTE — Telephone Encounter (Signed)
FYI "I have appointment tomorrow for iron consult.  Do I need to fast for blood work?  I am a Pharmacist, hospital.  Flu vaccines will be provided tomorrow.  Am I allowed to receive the flu vaccine before I come in tomorrow?    No lab appointment scheduled at this time.  No fasting needed for any labs that may be added after provider visit.  Okay to receive flu vaccine provided at work.  Denies further questions at this time.

## 2016-10-18 ENCOUNTER — Telehealth: Payer: Self-pay | Admitting: Hematology

## 2016-10-18 ENCOUNTER — Encounter: Payer: Self-pay | Admitting: Hematology

## 2016-10-18 ENCOUNTER — Ambulatory Visit (HOSPITAL_BASED_OUTPATIENT_CLINIC_OR_DEPARTMENT_OTHER): Payer: BC Managed Care – PPO

## 2016-10-18 ENCOUNTER — Ambulatory Visit (HOSPITAL_BASED_OUTPATIENT_CLINIC_OR_DEPARTMENT_OTHER): Payer: BC Managed Care – PPO | Admitting: Hematology

## 2016-10-18 VITALS — BP 135/79 | HR 79 | Temp 98.6°F | Resp 20 | Ht 64.0 in | Wt 254.8 lb

## 2016-10-18 DIAGNOSIS — K900 Celiac disease: Secondary | ICD-10-CM | POA: Diagnosis not present

## 2016-10-18 DIAGNOSIS — E538 Deficiency of other specified B group vitamins: Secondary | ICD-10-CM

## 2016-10-18 DIAGNOSIS — D508 Other iron deficiency anemias: Secondary | ICD-10-CM

## 2016-10-18 DIAGNOSIS — D509 Iron deficiency anemia, unspecified: Secondary | ICD-10-CM | POA: Diagnosis not present

## 2016-10-18 LAB — COMPREHENSIVE METABOLIC PANEL
ALBUMIN: 3.3 g/dL — AB (ref 3.5–5.0)
ALK PHOS: 80 U/L (ref 40–150)
ALT: 20 U/L (ref 0–55)
ANION GAP: 7 meq/L (ref 3–11)
AST: 18 U/L (ref 5–34)
BUN: 15.4 mg/dL (ref 7.0–26.0)
CO2: 24 mEq/L (ref 22–29)
Calcium: 9.2 mg/dL (ref 8.4–10.4)
Chloride: 109 mEq/L (ref 98–109)
Creatinine: 0.6 mg/dL (ref 0.6–1.1)
GLUCOSE: 93 mg/dL (ref 70–140)
Potassium: 3.8 mEq/L (ref 3.5–5.1)
SODIUM: 140 meq/L (ref 136–145)
Total Bilirubin: 0.33 mg/dL (ref 0.20–1.20)
Total Protein: 6.5 g/dL (ref 6.4–8.3)

## 2016-10-18 LAB — CBC & DIFF AND RETIC
BASO%: 0.6 % (ref 0.0–2.0)
Basophils Absolute: 0 10*3/uL (ref 0.0–0.1)
EOS ABS: 0.2 10*3/uL (ref 0.0–0.5)
EOS%: 3.1 % (ref 0.0–7.0)
HCT: 32.2 % — ABNORMAL LOW (ref 34.8–46.6)
HEMOGLOBIN: 10 g/dL — AB (ref 11.6–15.9)
Immature Retic Fract: 10.3 % — ABNORMAL HIGH (ref 1.60–10.00)
LYMPH%: 26.6 % (ref 14.0–49.7)
MCH: 20.8 pg — ABNORMAL LOW (ref 25.1–34.0)
MCHC: 31.1 g/dL — ABNORMAL LOW (ref 31.5–36.0)
MCV: 66.7 fL — ABNORMAL LOW (ref 79.5–101.0)
MONO#: 0.4 10*3/uL (ref 0.1–0.9)
MONO%: 7.8 % (ref 0.0–14.0)
NEUT%: 61.9 % (ref 38.4–76.8)
NEUTROS ABS: 3.3 10*3/uL (ref 1.5–6.5)
Platelets: 321 10*3/uL (ref 145–400)
RBC: 4.82 10*6/uL (ref 3.70–5.45)
RDW: 17.6 % — ABNORMAL HIGH (ref 11.2–14.5)
Retic %: 1.34 % (ref 0.70–2.10)
Retic Ct Abs: 64.59 10*3/uL (ref 33.70–90.70)
WBC: 5.4 10*3/uL (ref 3.9–10.3)
lymph#: 1.4 10*3/uL (ref 0.9–3.3)

## 2016-10-18 LAB — FERRITIN: FERRITIN: 6 ng/mL — AB (ref 9–269)

## 2016-10-18 LAB — IRON AND TIBC
%SAT: 4 % — ABNORMAL LOW (ref 21–57)
IRON: 18 ug/dL — AB (ref 41–142)
TIBC: 399 ug/dL (ref 236–444)
UIBC: 381 ug/dL (ref 120–384)

## 2016-10-18 NOTE — Telephone Encounter (Signed)
Scheduled appt per 10/4 los - Gave patient AVS and calender per los.  
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
## 2016-10-19 LAB — VITAMIN B12: Vitamin B12: 576 pg/mL (ref 232–1245)

## 2016-10-24 ENCOUNTER — Encounter: Payer: Self-pay | Admitting: Hematology

## 2016-10-24 ENCOUNTER — Telehealth: Payer: Self-pay

## 2016-10-24 NOTE — Telephone Encounter (Signed)
Based on conversation with Dr. Irene Limbo and review of OV note on 10/4. Pt to take B-complex vitamin at this time. Will recheck B12 with next set of labs and evaluate need for B12 injections. Pre-medications ordered with IV iron: tylenol, benadryl, and pepcid. Pt told to call back if she would rather take something other than benadryl. Dr. Irene Limbo okay for pt to take claritin or zyrtec OTC 30 minutes prior to infusion appt if desired. Then, benadryl would be held.

## 2016-10-25 ENCOUNTER — Encounter: Payer: Self-pay | Admitting: Hematology

## 2016-10-26 ENCOUNTER — Ambulatory Visit (HOSPITAL_BASED_OUTPATIENT_CLINIC_OR_DEPARTMENT_OTHER): Payer: BC Managed Care – PPO

## 2016-10-26 ENCOUNTER — Telehealth: Payer: Self-pay

## 2016-10-26 VITALS — BP 138/75 | HR 63 | Temp 97.8°F | Resp 16

## 2016-10-26 DIAGNOSIS — D509 Iron deficiency anemia, unspecified: Secondary | ICD-10-CM

## 2016-10-26 DIAGNOSIS — D508 Other iron deficiency anemias: Secondary | ICD-10-CM

## 2016-10-26 MED ORDER — ACETAMINOPHEN 325 MG PO TABS
650.0000 mg | ORAL_TABLET | Freq: Once | ORAL | Status: AC
  Administered 2016-10-26: 650 mg via ORAL

## 2016-10-26 MED ORDER — DIPHENHYDRAMINE HCL 25 MG PO CAPS
ORAL_CAPSULE | ORAL | Status: AC
  Filled 2016-10-26: qty 2

## 2016-10-26 MED ORDER — FAMOTIDINE 20 MG PO TABS
ORAL_TABLET | ORAL | Status: AC
  Filled 2016-10-26: qty 2

## 2016-10-26 MED ORDER — ACETAMINOPHEN 325 MG PO TABS
ORAL_TABLET | ORAL | Status: AC
  Filled 2016-10-26: qty 2

## 2016-10-26 MED ORDER — DIPHENHYDRAMINE HCL 25 MG PO CAPS
50.0000 mg | ORAL_CAPSULE | Freq: Once | ORAL | Status: AC
  Administered 2016-10-26: 50 mg via ORAL

## 2016-10-26 MED ORDER — FAMOTIDINE 20 MG PO TABS
40.0000 mg | ORAL_TABLET | Freq: Once | ORAL | Status: AC
  Administered 2016-10-26: 40 mg via ORAL

## 2016-10-26 MED ORDER — SODIUM CHLORIDE 0.9 % IV SOLN
750.0000 mg | Freq: Once | INTRAVENOUS | Status: AC
  Administered 2016-10-26: 750 mg via INTRAVENOUS
  Filled 2016-10-26: qty 15

## 2016-10-26 NOTE — Patient Instructions (Signed)
Ferric carboxymaltose injection What is this medicine? FERRIC CARBOXYMALTOSE (ferr-ik car-box-ee-mol-toes) is an iron complex. Iron is used to make healthy red blood cells, which carry oxygen and nutrients throughout the body. This medicine is used to treat anemia in people with chronic kidney disease or people who cannot take iron by mouth. This medicine may be used for other purposes; ask your health care provider or pharmacist if you have questions. COMMON BRAND NAME(S): Injectafer What should I tell my health care provider before I take this medicine? They need to know if you have any of these conditions: -anemia not caused by low iron levels -high levels of iron in the blood -liver disease -an unusual or allergic reaction to iron, other medicines, foods, dyes, or preservatives -pregnant or trying to get pregnant -breast-feeding How should I use this medicine? This medicine is for infusion into a vein. It is given by a health care professional in a hospital or clinic setting. Talk to your pediatrician regarding the use of this medicine in children. Special care may be needed. Overdosage: If you think you have taken too much of this medicine contact a poison control center or emergency room at once. NOTE: This medicine is only for you. Do not share this medicine with others. What if I miss a dose? It is important not to miss your dose. Call your doctor or health care professional if you are unable to keep an appointment. What may interact with this medicine? Do not take this medicine with any of the following medications: -deferoxamine -dimercaprol -other iron products This medicine may also interact with the following medications: -chloramphenicol -deferasirox This list may not describe all possible interactions. Give your health care provider a list of all the medicines, herbs, non-prescription drugs, or dietary supplements you use. Also tell them if you smoke, drink alcohol, or use  illegal drugs. Some items may interact with your medicine. What should I watch for while using this medicine? Visit your doctor or health care professional regularly. Tell your doctor if your symptoms do not start to get better or if they get worse. You may need blood work done while you are taking this medicine. You may need to follow a special diet. Talk to your doctor. Foods that contain iron include: whole grains/cereals, dried fruits, beans, or peas, leafy green vegetables, and organ meats (liver, kidney). What side effects may I notice from receiving this medicine? Side effects that you should report to your doctor or health care professional as soon as possible: -allergic reactions like skin rash, itching or hives, swelling of the face, lips, or tongue -breathing problems -changes in blood pressure -feeling faint or lightheaded, falls -flushing, sweating, or hot feelings Side effects that usually do not require medical attention (report to your doctor or health care professional if they continue or are bothersome): -changes in taste -constipation -dizziness -headache -nausea -pain, redness, or irritation at site where injected -vomiting This list may not describe all possible side effects. Call your doctor for medical advice about side effects. You may report side effects to FDA at 1-800-FDA-1088. Where should I keep my medicine? This drug is given in a hospital or clinic and will not be stored at home. NOTE: This sheet is a summary. It may not cover all possible information. If you have questions about this medicine, talk to your doctor, pharmacist, or health care provider.  2018 Elsevier/Gold Standard (2015-02-03 11:20:47)  

## 2016-10-26 NOTE — Telephone Encounter (Signed)
Pt sent message through [place] requesting confirmation of appt this morning at 9am. Pt was wondering if the power was still on here and we would be seeing patients today. Called pt mobile phone and confirmed. Pt said she would arrive at [place] for registration.

## 2016-11-02 ENCOUNTER — Ambulatory Visit (HOSPITAL_BASED_OUTPATIENT_CLINIC_OR_DEPARTMENT_OTHER): Payer: BC Managed Care – PPO

## 2016-11-02 VITALS — BP 96/54 | HR 78 | Temp 98.3°F | Resp 17

## 2016-11-02 DIAGNOSIS — D509 Iron deficiency anemia, unspecified: Secondary | ICD-10-CM

## 2016-11-02 DIAGNOSIS — D508 Other iron deficiency anemias: Secondary | ICD-10-CM

## 2016-11-02 MED ORDER — ACETAMINOPHEN 325 MG PO TABS
650.0000 mg | ORAL_TABLET | Freq: Once | ORAL | Status: AC
  Administered 2016-11-02: 650 mg via ORAL

## 2016-11-02 MED ORDER — SODIUM CHLORIDE 0.9 % IV SOLN
750.0000 mg | Freq: Once | INTRAVENOUS | Status: AC
  Administered 2016-11-02: 750 mg via INTRAVENOUS
  Filled 2016-11-02: qty 15

## 2016-11-02 MED ORDER — DIPHENHYDRAMINE HCL 25 MG PO CAPS
ORAL_CAPSULE | ORAL | Status: AC
  Filled 2016-11-02: qty 2

## 2016-11-02 MED ORDER — DIPHENHYDRAMINE HCL 25 MG PO CAPS
50.0000 mg | ORAL_CAPSULE | Freq: Once | ORAL | Status: AC
  Administered 2016-11-02: 50 mg via ORAL

## 2016-11-02 MED ORDER — FAMOTIDINE 20 MG PO TABS
40.0000 mg | ORAL_TABLET | Freq: Once | ORAL | Status: AC
  Administered 2016-11-02: 40 mg via ORAL

## 2016-11-02 MED ORDER — ACETAMINOPHEN 325 MG PO TABS
ORAL_TABLET | ORAL | Status: AC
  Filled 2016-11-02: qty 2

## 2016-11-02 MED ORDER — FAMOTIDINE 20 MG PO TABS
ORAL_TABLET | ORAL | Status: AC
  Filled 2016-11-02: qty 2

## 2016-11-02 NOTE — Patient Instructions (Signed)
Ferric carboxymaltose injection What is this medicine? FERRIC CARBOXYMALTOSE (ferr-ik car-box-ee-mol-toes) is an iron complex. Iron is used to make healthy red blood cells, which carry oxygen and nutrients throughout the body. This medicine is used to treat anemia in people with chronic kidney disease or people who cannot take iron by mouth. This medicine may be used for other purposes; ask your health care provider or pharmacist if you have questions. COMMON BRAND NAME(S): Injectafer What should I tell my health care provider before I take this medicine? They need to know if you have any of these conditions: -anemia not caused by low iron levels -high levels of iron in the blood -liver disease -an unusual or allergic reaction to iron, other medicines, foods, dyes, or preservatives -pregnant or trying to get pregnant -breast-feeding How should I use this medicine? This medicine is for infusion into a vein. It is given by a health care professional in a hospital or clinic setting. Talk to your pediatrician regarding the use of this medicine in children. Special care may be needed. Overdosage: If you think you have taken too much of this medicine contact a poison control center or emergency room at once. NOTE: This medicine is only for you. Do not share this medicine with others. What if I miss a dose? It is important not to miss your dose. Call your doctor or health care professional if you are unable to keep an appointment. What may interact with this medicine? Do not take this medicine with any of the following medications: -deferoxamine -dimercaprol -other iron products This medicine may also interact with the following medications: -chloramphenicol -deferasirox This list may not describe all possible interactions. Give your health care provider a list of all the medicines, herbs, non-prescription drugs, or dietary supplements you use. Also tell them if you smoke, drink alcohol, or use  illegal drugs. Some items may interact with your medicine. What should I watch for while using this medicine? Visit your doctor or health care professional regularly. Tell your doctor if your symptoms do not start to get better or if they get worse. You may need blood work done while you are taking this medicine. You may need to follow a special diet. Talk to your doctor. Foods that contain iron include: whole grains/cereals, dried fruits, beans, or peas, leafy green vegetables, and organ meats (liver, kidney). What side effects may I notice from receiving this medicine? Side effects that you should report to your doctor or health care professional as soon as possible: -allergic reactions like skin rash, itching or hives, swelling of the face, lips, or tongue -breathing problems -changes in blood pressure -feeling faint or lightheaded, falls -flushing, sweating, or hot feelings Side effects that usually do not require medical attention (report to your doctor or health care professional if they continue or are bothersome): -changes in taste -constipation -dizziness -headache -nausea -pain, redness, or irritation at site where injected -vomiting This list may not describe all possible side effects. Call your doctor for medical advice about side effects. You may report side effects to FDA at 1-800-FDA-1088. Where should I keep my medicine? This drug is given in a hospital or clinic and will not be stored at home. NOTE: This sheet is a summary. It may not cover all possible information. If you have questions about this medicine, talk to your doctor, pharmacist, or health care provider.  2018 Elsevier/Gold Standard (2015-02-03 11:20:47)  

## 2016-11-03 ENCOUNTER — Encounter: Payer: Self-pay | Admitting: Hematology

## 2016-11-06 ENCOUNTER — Telehealth: Payer: Self-pay

## 2016-11-06 NOTE — Telephone Encounter (Signed)
Pt c/o severe HA, vomiting, and diarrhea following last IV iron treatment. Pt told me that she does have a history of chronic migraine, but they typically start on the left side and radiate around. This time, there was sharp pain on the R side consistently. At times reaching, "15/10 pain" per pt. She is feeling better now. Visited her PCP and is being referred to a new neurologist because hers is retiring. Will communicate symptoms with Dr. Irene Limbo and any changes in therapy for the future will be shared with the patient.

## 2016-11-08 ENCOUNTER — Telehealth: Payer: Self-pay

## 2016-11-08 NOTE — Telephone Encounter (Signed)
Pt requested call back regarding symptoms experienced post IV iron. Spoke with Dr. Irene Limbo and no concern at this time as pt symptoms have mostly resolved. If IV iron determined to be a needed therapy again, potential to implement anti-nausea medication, tylenol, and immodium as pre-medications. Pt and doctor to discuss prior to ordering of therapy. Pt commented that she is still trying to get a new neurologist and will most likely be on a different preventative medication for her migraines when she comes back in for a visit in December.

## 2016-11-19 ENCOUNTER — Encounter: Payer: Self-pay | Admitting: Neurology

## 2016-11-19 ENCOUNTER — Ambulatory Visit: Payer: BC Managed Care – PPO | Admitting: Neurology

## 2016-11-19 VITALS — BP 126/81 | HR 71 | Ht 64.0 in | Wt 249.0 lb

## 2016-11-19 DIAGNOSIS — G43709 Chronic migraine without aura, not intractable, without status migrainosus: Secondary | ICD-10-CM | POA: Diagnosis not present

## 2016-11-19 DIAGNOSIS — Z8673 Personal history of transient ischemic attack (TIA), and cerebral infarction without residual deficits: Secondary | ICD-10-CM | POA: Diagnosis not present

## 2016-11-19 DIAGNOSIS — IMO0002 Reserved for concepts with insufficient information to code with codable children: Secondary | ICD-10-CM | POA: Insufficient documentation

## 2016-11-19 MED ORDER — TIZANIDINE HCL 4 MG PO TABS: 4.0000 mg | ORAL_TABLET | Freq: Four times a day (QID) | ORAL | 6 refills | Status: DC | PRN

## 2016-11-19 MED ORDER — ONDANSETRON 4 MG PO TBDP
4.0000 mg | ORAL_TABLET | Freq: Three times a day (TID) | ORAL | 6 refills | Status: DC | PRN
Start: 2016-11-19 — End: 2017-10-29

## 2016-11-19 MED ORDER — DICLOFENAC POTASSIUM(MIGRAINE) 50 MG PO PACK: 50.0000 mg | PACK | Freq: Every day | ORAL | 6 refills | Status: DC | PRN

## 2016-11-19 MED ORDER — ZONISAMIDE 100 MG PO CAPS: 100.0000 mg | ORAL_CAPSULE | Freq: Two times a day (BID) | ORAL | 11 refills | Status: DC
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
## 2016-11-19 NOTE — Patient Instructions (Signed)
For headache prevention  Increase Zonegran to 100 mg twice a day You may also add on over-the-counter magnesium oxide 400 mg twice a day, riboflavin= vitamin B2 100 mg twice a day  When you do get headaches, you can take Cambia 50mg  as needed Along with Zofran 4mg  for nauseous Tizanidine 4mg  as needed for muscle relaxant

## 2016-12-10 ENCOUNTER — Encounter (INDEPENDENT_AMBULATORY_CARE_PROVIDER_SITE_OTHER): Payer: Self-pay

## 2016-12-12 ENCOUNTER — Encounter: Payer: Self-pay | Admitting: Certified Nurse Midwife

## 2016-12-13 ENCOUNTER — Telehealth: Payer: Self-pay | Admitting: Hematology

## 2016-12-13 NOTE — Telephone Encounter (Signed)
[facility] - moved 12/4 appointments to 12/13. Spoke with patient she is aware.

## 2016-12-18 ENCOUNTER — Other Ambulatory Visit: Payer: Self-pay

## 2016-12-18 ENCOUNTER — Ambulatory Visit: Payer: Self-pay | Admitting: Hematology

## 2016-12-27 ENCOUNTER — Encounter: Payer: Self-pay | Admitting: *Deleted

## 2016-12-27 ENCOUNTER — Encounter (INDEPENDENT_AMBULATORY_CARE_PROVIDER_SITE_OTHER): Payer: Self-pay

## 2016-12-27 ENCOUNTER — Ambulatory Visit (HOSPITAL_BASED_OUTPATIENT_CLINIC_OR_DEPARTMENT_OTHER): Payer: BC Managed Care – PPO | Admitting: Hematology

## 2016-12-27 ENCOUNTER — Encounter: Payer: Self-pay | Admitting: Hematology

## 2016-12-27 ENCOUNTER — Telehealth: Payer: Self-pay | Admitting: Hematology

## 2016-12-27 ENCOUNTER — Other Ambulatory Visit: Payer: Self-pay

## 2016-12-27 ENCOUNTER — Other Ambulatory Visit (HOSPITAL_BASED_OUTPATIENT_CLINIC_OR_DEPARTMENT_OTHER): Payer: BC Managed Care – PPO

## 2016-12-27 VITALS — BP 127/77 | HR 86 | Temp 98.6°F | Resp 18 | Wt 247.4 lb

## 2016-12-27 DIAGNOSIS — D508 Other iron deficiency anemias: Secondary | ICD-10-CM

## 2016-12-27 DIAGNOSIS — D509 Iron deficiency anemia, unspecified: Secondary | ICD-10-CM

## 2016-12-27 LAB — COMPREHENSIVE METABOLIC PANEL
ALT: 19 U/L (ref 0–55)
AST: 15 U/L (ref 5–34)
Albumin: 4 g/dL (ref 3.5–5.0)
Alkaline Phosphatase: 79 U/L (ref 40–150)
Anion Gap: 10 mEq/L (ref 3–11)
BUN: 17.3 mg/dL (ref 7.0–26.0)
CHLORIDE: 109 meq/L (ref 98–109)
CO2: 21 mEq/L — ABNORMAL LOW (ref 22–29)
Calcium: 9.3 mg/dL (ref 8.4–10.4)
Creatinine: 0.7 mg/dL (ref 0.6–1.1)
EGFR: >60 mL/min/{1.73_m2} (ref >60)
GLUCOSE: 90 mg/dL (ref 70–140)
POTASSIUM: 4 meq/L (ref 3.5–5.1)
SODIUM: 140 meq/L (ref 136–145)
TOTAL PROTEIN: 7 g/dL (ref 6.4–8.3)
Total Bilirubin: 0.43 mg/dL (ref 0.20–1.20)

## 2016-12-27 LAB — IRON AND TIBC
%SAT: 19 % — ABNORMAL LOW (ref 21–57)
Iron: 59 ug/dL (ref 41–142)
TIBC: 315 ug/dL (ref 236–444)
UIBC: 256 ug/dL (ref 120–384)

## 2016-12-27 LAB — CBC & DIFF AND RETIC
BASO%: 0.4 % (ref 0.0–2.0)
BASOS ABS: 0 10*3/uL (ref 0.0–0.1)
EOS ABS: 0.2 10*3/uL (ref 0.0–0.5)
EOS%: 2.1 % (ref 0.0–7.0)
HEMATOCRIT: 43.8 % (ref 34.8–46.6)
HEMOGLOBIN: 14.7 g/dL (ref 11.6–15.9)
IMMATURE RETIC FRACT: 3.2 % (ref 1.60–10.00)
LYMPH%: 23.3 % (ref 14.0–49.7)
MCH: 27.1 pg (ref 25.1–34.0)
MCHC: 33.6 g/dL (ref 31.5–36.0)
MCV: 80.8 fL (ref 79.5–101.0)
MONO#: 0.5 10*3/uL (ref 0.1–0.9)
MONO%: 4.9 % (ref 0.0–14.0)
NEUT#: 6.5 10*3/uL (ref 1.5–6.5)
NEUT%: 69.3 % (ref 38.4–76.8)
PLATELETS: 275 10*3/uL (ref 145–400)
RBC: 5.42 10*6/uL (ref 3.70–5.45)
RDW: 22.4 % — ABNORMAL HIGH (ref 11.2–14.5)
Retic %: 0.93 % (ref 0.70–2.10)
Retic Ct Abs: 50.41 10*3/uL (ref 33.70–90.70)
WBC: 9.4 10*3/uL (ref 3.9–10.3)
lymph#: 2.2 10*3/uL (ref 0.9–3.3)

## 2016-12-27 LAB — FERRITIN: FERRITIN: 96 ng/mL (ref 9–269)

## 2016-12-27 NOTE — Telephone Encounter (Signed)
Scheduled appt per 12/13 los - Gave patient AVS and calender per los.  
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
## 2017-01-15 DIAGNOSIS — D649 Anemia, unspecified: Secondary | ICD-10-CM

## 2017-01-15 HISTORY — DX: Anemia, unspecified: D64.9

## 2017-01-21 ENCOUNTER — Ambulatory Visit: Payer: BC Managed Care – PPO | Admitting: Neurology

## 2017-01-21 ENCOUNTER — Encounter: Payer: Self-pay | Admitting: Neurology

## 2017-01-21 VITALS — BP 130/90 | HR 88 | Ht 64.0 in | Wt 247.0 lb

## 2017-01-21 DIAGNOSIS — G43709 Chronic migraine without aura, not intractable, without status migrainosus: Secondary | ICD-10-CM

## 2017-01-21 DIAGNOSIS — M7918 Myalgia, other site: Secondary | ICD-10-CM | POA: Diagnosis not present
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
## 2017-01-21 NOTE — Patient Instructions (Addendum)
Magnesium citrate 428m to 6022mdaily, riboflavin 40065maily, Coenzyme Q 10 100m61mree times daily Consider joining Facebook group [facility].  "CGRP Migraine": Ajovy, Aimovig    Fremanezumab-vfrm: Patient drug information Copyright 1978787-267-1871icomp, Inc. All rights reserved. (For additional information see "Fremanezumab-vfrm: Drug information") Brand Names: [place]  [place]ovy  What is this drug used for?   It is used to prevent migraine headaches.  What do I need to tell my doctor BEFORE I take this drug?   If you have an allergy to this drug or any part of this drug.   If you are allergic to any drugs like this one, any other drugs, foods, or other substances. Tell your doctor about the allergy and what signs you had, like rash; hives; itching; shortness of breath; wheezing; cough; swelling of face, lips, tongue, or throat; or any other signs.   This drug may interact with other drugs or health problems.   Tell your doctor and pharmacist about all of your drugs (prescription or OTC, natural products, vitamins) and health problems. You must check to make sure that it is safe for you to take this drug with all of your drugs and health problems. Do not start, stop, or change the dose of any drug without checking with your doctor.  What are some things I need to know or do while I take this drug?   Tell all of your health care providers that you take this drug. This includes your doctors, nurses, pharmacists, and dentists.   Allergic reactions have happened within hours and up to 1 month after this drug was given. Tell your doctor right away about any bad effects after getting this drug.   Tell your doctor if you are pregnant or plan on getting pregnant. You will need to talk about the benefits and risks of using this drug while you are pregnant.   Tell your doctor if you are breast-feeding. You will need to talk about any risks to your baby.  What are some side effects that  I need to call my doctor about right away?   WARNING/CAUTION: Even though it may be rare, some people may have very bad and sometimes deadly side effects when taking a drug. Tell your doctor or get medical help right away if you have any of the following signs or symptoms that may be related to a very bad side effect:   Signs of an allergic reaction, like rash; hives; itching; red, swollen, blistered, or peeling skin with or without fever; wheezing; tightness in the chest or throat; trouble breathing, swallowing, or talking; unusual hoarseness; or swelling of the mouth, face, lips, tongue, or throat.   Very bad irritation where the shot was given.  What are some other side effects of this drug?   All drugs may cause side effects. However, many people have no side effects or only have minor side effects. Call your doctor or get medical help if any of these side effects or any other side effects bother you or do not go away:   Irritation where the shot is given.   These are not all of the side effects that may occur. If you have questions about side effects, call your doctor. Call your doctor for medical advice about side effects.   You may report side effects to your national health agency.  How is this drug best taken?   Use this drug as ordered by your doctor. Read all information given to  you. Follow all instructions closely.   It is given as a shot into the fatty part of the skin on the top of the thigh, belly area, or upper arm.   If you will be giving yourself the shot, your doctor or nurse will teach you how to give the shot.   Follow how to use as you have been told by the doctor or read the package insert.   Wash your hands before use.   If stored in a refrigerator, let this drug come to room temperature before using it. Leave it at room temperature for at least 30 minutes. Do not heat this drug.   Do not shake.   Do not give into skin that is irritated, bruised, red, infected, or  scarred.   Do not give into the same place as another shot.   If your dose is more than 1 injection, you may give it in the same body part. Make sure you do not give injections in the same spot you used for the other injections.   Do not use if the solution is cloudy, leaking, or has particles.   This drug is colorless to a faint yellow. Do not use if the solution changes color.   Each prefilled syringe is for one use only.   Throw away needles in a needle/sharp disposal box. Do not reuse needles or other items. When the box is full, follow all local rules for getting rid of it. Talk with a doctor or pharmacist if you have any questions.  What do I do if I miss a dose?   Take a missed dose as soon as you think about it.   After taking a missed dose, start a new schedule based on when the dose is taken.  How do I store and/or throw out this drug?   Store in a refrigerator. Do not freeze.   Store in the carton to protect from light.   If needed, this drug can be left out at room temperature for up to 24 hours. Throw away drug if left at room temperature for more than 24 hours.   Protect from heat and sunlight.   Keep all drugs in a safe place. Keep all drugs out of the reach of children and pets.   Throw away unused or expired drugs. Do not flush down a toilet or pour down a drain unless you are told to do so. Check with your pharmacist if you have questions about the best way to throw out drugs. There may be drug take-back programs in your area.  General drug facts   If your symptoms or health problems do not get better or if they become worse, call your doctor.   Do not share your drugs with others and do not take anyone else's drugs.   Keep a list of all your drugs (prescription, natural products, vitamins, OTC) with you. Give this list to your doctor.   Talk with the doctor before starting any new drug, including prescription or OTC, natural products, or vitamins.   Some drugs may have  another patient information leaflet. If you have any questions about this drug, please talk with your doctor, nurse, pharmacist, or other health care provider.   If you think there has been an overdose, call your poison control center or get medical care right away. Be ready to tell or show what was taken, how much, and when it happened.  Use of UpToDate is subject to the Subscription  and License Agreement. Topic [phone] Version 1.0      Magnesium citrate 49m to 6029mdaily, riboflavin 40058maily, Coenzyme Q 10 100m69mree times daily Consider joining Facebook group [facility].  Look up Ajovy or "Cgrp Migraine"

## 2017-01-22 ENCOUNTER — Other Ambulatory Visit: Payer: Self-pay | Admitting: Physician Assistant

## 2017-01-22 DIAGNOSIS — R6884 Jaw pain: Principal | ICD-10-CM

## 2017-01-22 DIAGNOSIS — K118 Other diseases of salivary glands: Secondary | ICD-10-CM

## 2017-01-24 ENCOUNTER — Telehealth: Payer: Self-pay | Admitting: Neurology

## 2017-01-24 ENCOUNTER — Ambulatory Visit
Admission: RE | Admit: 2017-01-24 | Discharge: 2017-01-24 | Disposition: A | Discharge status: Home / Self Care | Payer: BC Managed Care – PPO | Source: Ambulatory Visit | Attending: Physician Assistant | Admitting: Physician Assistant

## 2017-01-24 DIAGNOSIS — R6884 Jaw pain: Principal | ICD-10-CM

## 2017-01-24 DIAGNOSIS — K118 Other diseases of salivary glands: Secondary | ICD-10-CM

## 2017-01-24 MED ORDER — IOPAMIDOL (ISOVUE-300) INJECTION 61%
75.0000 mL | Freq: Once | INTRAVENOUS | Status: AC | PRN
  Administered 2017-01-24: 75 mL via INTRAVENOUS

## 2017-01-24 NOTE — Telephone Encounter (Signed)
Referral sent to [facility] .

## 2017-01-30 ENCOUNTER — Encounter: Payer: Self-pay | Admitting: Neurology

## 2017-04-01 ENCOUNTER — Encounter (INDEPENDENT_AMBULATORY_CARE_PROVIDER_SITE_OTHER): Payer: Self-pay
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
## 2017-04-22 ENCOUNTER — Inpatient Hospital Stay: Payer: BC Managed Care – PPO

## 2017-04-22 ENCOUNTER — Encounter: Payer: Self-pay | Admitting: Hematology

## 2017-04-22 ENCOUNTER — Inpatient Hospital Stay: Payer: BC Managed Care – PPO | Attending: Hematology | Admitting: Hematology

## 2017-04-22 ENCOUNTER — Telehealth: Payer: Self-pay | Admitting: Certified Nurse Midwife

## 2017-04-22 ENCOUNTER — Telehealth: Payer: Self-pay | Admitting: Hematology

## 2017-04-22 VITALS — BP 149/84 | HR 67 | Temp 98.6°F | Resp 18 | Ht 64.0 in | Wt 256.3 lb

## 2017-04-22 DIAGNOSIS — D259 Leiomyoma of uterus, unspecified: Secondary | ICD-10-CM

## 2017-04-22 DIAGNOSIS — D509 Iron deficiency anemia, unspecified: Secondary | ICD-10-CM | POA: Insufficient documentation

## 2017-04-22 DIAGNOSIS — Z8673 Personal history of transient ischemic attack (TIA), and cerebral infarction without residual deficits: Secondary | ICD-10-CM | POA: Insufficient documentation

## 2017-04-22 DIAGNOSIS — N926 Irregular menstruation, unspecified: Secondary | ICD-10-CM

## 2017-04-22 DIAGNOSIS — D508 Other iron deficiency anemias: Secondary | ICD-10-CM

## 2017-04-22 LAB — CBC WITH DIFFERENTIAL (CANCER CENTER ONLY)
Basophils Absolute: 0 10*3/uL (ref 0.0–0.1)
Basophils Relative: 0 %
EOS PCT: 3 %
Eosinophils Absolute: 0.2 10*3/uL (ref 0.0–0.5)
HEMATOCRIT: 42.5 % (ref 34.8–46.6)
Hemoglobin: 14.3 g/dL (ref 11.6–15.9)
LYMPHS ABS: 1.9 10*3/uL (ref 0.9–3.3)
LYMPHS PCT: 25 %
MCH: 29 pg (ref 25.1–34.0)
MCHC: 33.6 g/dL (ref 31.5–36.0)
MCV: 86.2 fL (ref 79.5–101.0)
Monocytes Absolute: 0.5 10*3/uL (ref 0.1–0.9)
Monocytes Relative: 7 %
Neutro Abs: 4.9 10*3/uL (ref 1.5–6.5)
Neutrophils Relative %: 65 %
PLATELETS: 251 10*3/uL (ref 145–400)
RBC: 4.93 MIL/uL (ref 3.70–5.45)
RDW: 13.1 % (ref 11.2–14.5)
WBC Count: 7.6 10*3/uL (ref 3.9–10.3)

## 2017-04-22 LAB — FERRITIN: Ferritin: 49 ng/mL (ref 9–269)

## 2017-04-22 LAB — COMPREHENSIVE METABOLIC PANEL
ALK PHOS: 77 U/L (ref 40–150)
ALT: 30 U/L (ref 0–55)
ANION GAP: 7 (ref 3–11)
AST: 19 U/L (ref 5–34)
Albumin: 3.5 g/dL (ref 3.5–5.0)
BILIRUBIN TOTAL: 0.5 mg/dL (ref 0.2–1.2)
BUN: 13 mg/dL (ref 7–26)
CALCIUM: 9.5 mg/dL (ref 8.4–10.4)
CO2: 24 mmol/L (ref 22–29)
CREATININE: 0.63 mg/dL (ref 0.60–1.10)
Chloride: 106 mmol/L (ref 98–109)
GFR calc non Af Amer: >60 mL/min (ref >60)
Glucose, Bld: 91 mg/dL (ref 70–140)
Potassium: 4 mmol/L (ref 3.5–5.1)
Sodium: 137 mmol/L (ref 136–145)
TOTAL PROTEIN: 6.5 g/dL (ref 6.4–8.3)

## 2017-04-22 LAB — RETICULOCYTES
RBC.: 4.93 MIL/uL (ref 3.70–5.45)
Retic Count, Absolute: 83.8 10*3/uL (ref 33.7–90.7)
Retic Ct Pct: 1.7 % (ref 0.7–2.1)

## 2017-04-22 LAB — IRON AND TIBC
IRON: 73 ug/dL (ref 41–142)
Saturation Ratios: 24 % (ref 21–57)
TIBC: 299 ug/dL (ref 236–444)
UIBC: 226 ug/dL

## 2017-04-22 NOTE — Telephone Encounter (Signed)
Left message to call Tavarion Babington at [phone].  

## 2017-04-22 NOTE — Telephone Encounter (Signed)
Scheduled appt per 4/8 los - patients is aware of appts - my chart active.

## 2017-04-22 NOTE — Telephone Encounter (Signed)
Yes, this is fine. Thanks!

## 2017-04-22 NOTE — Telephone Encounter (Signed)
Returning a call to Var[place] .

## 2017-04-22 NOTE — Telephone Encounter (Signed)
Patient called during lunch and left a message requesting a call back to schedule an appointment with Melvia Heaps, CNM for irregular, painful menstrual cycles.  Last seen: 07/24/16

## 2017-04-22 NOTE — Telephone Encounter (Signed)
Spoke with patient. Reports heavy and irregular menses, LMP 04/13/17. Changes super plus tampon q1-2 hrs on heaviest days of cycle. No bleeding currently. Reports fatigue and "increased memory loss". Seen by hematology today, is scheduled for next iron transfusion on 05/03/17. No contraceptive, "spouse is infertile".  Denies any other GYN symptoms or pain.   Last AEX 07/24/16 with Melvia Heaps, CNM.   Last PUS 07/29/14 -Intramural fibroid, collapsing left ovarian cyst, Dr. Sabra Heck.   PUS scheduled for 04/25/17 at 2pm with consult to follow at 2:30pm with Dr. Sabra Heck. Advised patient Dr. Sabra Heck will review, I will return call with any additional recommendations. Patient verbalizes understanding.   Dr. Sabra Heck, ok to proceed with PUS as scheduled?    Cc: Melvia Heaps, CNM

## 2017-04-22 NOTE — Telephone Encounter (Signed)
Spoke with patient, is at lunch, will return call when she can speak in greater detail.

## 2017-04-23 ENCOUNTER — Encounter: Payer: Self-pay | Admitting: Certified Nurse Midwife

## 2017-04-23 ENCOUNTER — Ambulatory Visit: Payer: Self-pay | Admitting: Certified Nurse Midwife

## 2017-04-23 NOTE — Telephone Encounter (Signed)
Order placed for PUS, routing to [place] D. For precert, will close encounter.

## 2017-04-25 ENCOUNTER — Encounter: Payer: Self-pay | Admitting: Obstetrics & Gynecology

## 2017-04-25 ENCOUNTER — Other Ambulatory Visit: Payer: Self-pay

## 2017-04-25 ENCOUNTER — Ambulatory Visit: Payer: BC Managed Care – PPO | Admitting: Obstetrics & Gynecology

## 2017-04-25 ENCOUNTER — Ambulatory Visit (INDEPENDENT_AMBULATORY_CARE_PROVIDER_SITE_OTHER): Payer: BC Managed Care – PPO

## 2017-04-25 VITALS — BP 136/86 | HR 66 | Ht 64.0 in | Wt 258.0 lb

## 2017-04-25 DIAGNOSIS — D500 Iron deficiency anemia secondary to blood loss (chronic): Secondary | ICD-10-CM

## 2017-04-25 DIAGNOSIS — N921 Excessive and frequent menstruation with irregular cycle: Secondary | ICD-10-CM

## 2017-04-25 DIAGNOSIS — N926 Irregular menstruation, unspecified: Secondary | ICD-10-CM

## 2017-04-25 DIAGNOSIS — D259 Leiomyoma of uterus, unspecified: Secondary | ICD-10-CM

## 2017-04-25 DIAGNOSIS — D251 Intramural leiomyoma of uterus: Secondary | ICD-10-CM

## 2017-04-25 NOTE — Progress Notes (Signed)
42 y.o. [id number] Married Caucasian female here for pelvic ultrasound due to change in menstrual cycle and issues with iron deficiency that seems to have started last fall.  Pt reports cycles used to be very regular and lasted on 3 days.  Since the fall, flow has been every 2-5 weeks.  Flow is very heavy and she is passing large clots as well.  Bleeding can be anywhere between 3-9 days.    Has been receiving iron transfusions.  Had two in October and then has one scheduled next Friday.  Pt reports she is completley tired of bleeding and interested in discussion of treatment options.    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  Fibroid appears degenerative as well. EMS: 10.8mm ADNEXA: Left ovary: 3.7 x 2.0 x 9.9ME with 2.6ST follicle       Right ovary: 2.2 x 1.2 x 0.8cm CUL DE SAC:  No free fluid noted  Physical Exam  Constitutional: She is oriented to person, place, and time. She appears well-developed and well-nourished.  GI: Soft. Bowel sounds are normal. She exhibits no distension and no mass. There is no tenderness.  Genitourinary: Vagina normal and uterus normal. There is no rash, tenderness, lesion or injury on the right labia. There is no rash, tenderness, lesion or injury on the left labia. Right adnexum displays no mass, no tenderness and no fullness. Left adnexum displays no mass, no tenderness and no fullness.  Lymphadenopathy:       Right: No inguinal adenopathy present.       Left: No inguinal adenopathy present.  Neurological: She is alert and oriented to person, place, and time.  Skin: Skin is warm and dry.  Psychiatric: She has a normal mood and affect.   Endometrial biopsy recommended.  Discussed with patient.  Verbal and written consent obtained.   Procedure:  Speculum placed.  Cervix visualized and cleansed with betadine prep.  A single toothed tenaculum was  applied to the anterior lip of the cervix.  An attempt was made to pass endometrial pipelle which was unsuccessful.  Milex dilator was needed for cervical dilation.  Then endometrial pipelle was advanced through the cervix into the endometrial cavity without difficulty.  Pipelle passed to 8cm.  Suction applied and pipelle removed with good tissue sample obtained.  Tenculum removed.  No bleeding noted.  Patient tolerated procedure well.  Discussion:  Options for treatment reviewed including hormonal, IUD, ablation and hysterectomy.  She is desirous of definitive treatment.  Procedure discussed with patient.  Hospital stay, recovery and pain management all discussed.  Risks discussed including but not limited to bleeding, 1% risk of receiving a  transfusion, infection, 3-4% risk of bowel/bladder/ureteral/vascular injury discussed as well as possible need for additional surgery if injury does occur discussed.  DVT/PE and rare risk of death discussed.  My actual complications with prior surgeries discussed.  Vaginal cuff dehiscence discussed.  Hernia formation discussed.  Positioning and incision locations discussed.  Patient aware if pathology abnormal she may need additional treatment.  All questions answered.    Assessment:  Menorrhagia, iron deficiency with prior anemia, degenerative fibroid  Plan:  Endometrial biopsy pathology will be communicated with pt and surgical planning will be initiated.  ~30 minutes spent with patient >50% of time was in face to face discussion of above.

## 2017-04-26 ENCOUNTER — Other Ambulatory Visit: Payer: Self-pay

## 2017-04-26 ENCOUNTER — Ambulatory Visit: Payer: Self-pay | Admitting: Hematology

## 2017-04-30 ENCOUNTER — Encounter: Payer: Self-pay | Admitting: Obstetrics & Gynecology

## 2017-04-30 ENCOUNTER — Telehealth: Payer: Self-pay | Admitting: Obstetrics & Gynecology

## 2017-04-30 NOTE — Telephone Encounter (Signed)
Spoke with patient regarding benefit for surgery. Patient understood and agreeable. Patient has confirmed and is ready to proceed with scheduling. Patient aware this is professional benefit only. Patient aware will be contacted by hospital for separate benefits. Forwarding to Conservation officer, historic buildings for scheduling.   Forwarding to Lamont Snowball, RN

## 2017-05-02 ENCOUNTER — Other Ambulatory Visit: Payer: Self-pay | Admitting: Obstetrics & Gynecology

## 2017-05-03 ENCOUNTER — Inpatient Hospital Stay: Payer: BC Managed Care – PPO

## 2017-05-03 VITALS — BP 149/73 | HR 61 | Temp 97.7°F | Resp 18

## 2017-05-03 DIAGNOSIS — D509 Iron deficiency anemia, unspecified: Secondary | ICD-10-CM | POA: Diagnosis not present

## 2017-05-03 DIAGNOSIS — D508 Other iron deficiency anemias: Secondary | ICD-10-CM

## 2017-05-03 MED ORDER — DIPHENHYDRAMINE HCL 25 MG PO CAPS
ORAL_CAPSULE | ORAL | Status: AC
  Filled 2017-05-03: qty 1

## 2017-05-03 MED ORDER — FAMOTIDINE 20 MG PO TABS
ORAL_TABLET | ORAL | Status: AC
  Filled 2017-05-03: qty 1

## 2017-05-03 MED ORDER — FAMOTIDINE 20 MG PO TABS
40.0000 mg | ORAL_TABLET | Freq: Once | ORAL | Status: AC
  Administered 2017-05-03: 40 mg via ORAL

## 2017-05-03 MED ORDER — ACETAMINOPHEN 325 MG PO TABS
ORAL_TABLET | ORAL | Status: AC
  Filled 2017-05-03: qty 2

## 2017-05-03 MED ORDER — ACETAMINOPHEN 325 MG PO TABS
650.0000 mg | ORAL_TABLET | Freq: Once | ORAL | Status: AC
  Administered 2017-05-03: 650 mg via ORAL

## 2017-05-03 MED ORDER — SODIUM CHLORIDE 0.9 % IV SOLN
750.0000 mg | Freq: Once | INTRAVENOUS | Status: AC
  Administered 2017-05-03: 750 mg via INTRAVENOUS
  Filled 2017-05-03: qty 15

## 2017-05-03 MED ORDER — DIPHENHYDRAMINE HCL 25 MG PO CAPS
50.0000 mg | ORAL_CAPSULE | Freq: Once | ORAL | Status: AC
  Administered 2017-05-03: 50 mg via ORAL

## 2017-05-03 NOTE — Patient Instructions (Signed)
Ferric carboxymaltose injection What is this medicine? FERRIC CARBOXYMALTOSE (ferr-ik car-box-ee-mol-toes) is an iron complex. Iron is used to make healthy red blood cells, which carry oxygen and nutrients throughout the body. This medicine is used to treat anemia in people with chronic kidney disease or people who cannot take iron by mouth. This medicine may be used for other purposes; ask your health care provider or pharmacist if you have questions. COMMON BRAND NAME(S): Injectafer What should I tell my health care provider before I take this medicine? They need to know if you have any of these conditions: -anemia not caused by low iron levels -high levels of iron in the blood -liver disease -an unusual or allergic reaction to iron, other medicines, foods, dyes, or preservatives -pregnant or trying to get pregnant -breast-feeding How should I use this medicine? This medicine is for infusion into a vein. It is given by a health care professional in a hospital or clinic setting. Talk to your pediatrician regarding the use of this medicine in children. Special care may be needed. Overdosage: If you think you have taken too much of this medicine contact a poison control center or emergency room at once. NOTE: This medicine is only for you. Do not share this medicine with others. What if I miss a dose? It is important not to miss your dose. Call your doctor or health care professional if you are unable to keep an appointment. What may interact with this medicine? Do not take this medicine with any of the following medications: -deferoxamine -dimercaprol -other iron products This medicine may also interact with the following medications: -chloramphenicol -deferasirox This list may not describe all possible interactions. Give your health care provider a list of all the medicines, herbs, non-prescription drugs, or dietary supplements you use. Also tell them if you smoke, drink alcohol, or use  illegal drugs. Some items may interact with your medicine. What should I watch for while using this medicine? Visit your doctor or health care professional regularly. Tell your doctor if your symptoms do not start to get better or if they get worse. You may need blood work done while you are taking this medicine. You may need to follow a special diet. Talk to your doctor. Foods that contain iron include: whole grains/cereals, dried fruits, beans, or peas, leafy green vegetables, and organ meats (liver, kidney). What side effects may I notice from receiving this medicine? Side effects that you should report to your doctor or health care professional as soon as possible: -allergic reactions like skin rash, itching or hives, swelling of the face, lips, or tongue -breathing problems -changes in blood pressure -feeling faint or lightheaded, falls -flushing, sweating, or hot feelings Side effects that usually do not require medical attention (report to your doctor or health care professional if they continue or are bothersome): -changes in taste -constipation -dizziness -headache -nausea -pain, redness, or irritation at site where injected -vomiting This list may not describe all possible side effects. Call your doctor for medical advice about side effects. You may report side effects to FDA at 1-800-FDA-1088. Where should I keep my medicine? This drug is given in a hospital or clinic and will not be stored at home. NOTE: This sheet is a summary. It may not cover all possible information. If you have questions about this medicine, talk to your doctor, pharmacist, or health care provider.  2018 Elsevier/Gold Standard (2015-02-03 11:20:47)  

## 2017-05-09 ENCOUNTER — Encounter: Payer: Self-pay | Admitting: Obstetrics & Gynecology

## 2017-05-09 ENCOUNTER — Telehealth: Payer: Self-pay | Admitting: Obstetrics & Gynecology

## 2017-05-09 NOTE — Telephone Encounter (Signed)
Patient sent the following correspondence through [place]. Routing to triage to assist patient with request.  ----- Message from [place], Generic sent at 05/09/2017 8:45 AM EDT -----    Hi! I am going on day 6 of this cycle and it is not slowing down. Is there anyway you can prescribe something for cramps (that won't knock me out)? Thanks!  [name]   Last seen: 04/25/17

## 2017-05-09 NOTE — Telephone Encounter (Signed)
Office visit tomorrow early am.  To ER tonight if becomes symptomatic with dizziness or lightheadedness.  Driving precautions.

## 2017-05-09 NOTE — Telephone Encounter (Addendum)
Spoke with patient. Patient states she is scheduled for [facility] 06/2017 with Dr. Sabra Heck. Currently on day 6 of menses, changing super plus tampon q1 hr. Reports menstrual like cramping 8/10, tylenol prn provides some relief.   Reports fatigue, had iron transfusion on 05/03/17.   Requesting something stronger for cramps.  Advised OV recommended for further evaluation. Patient declined OV for today, states she is traveling to [place]. Patient available for OV 4/26. Advised will review with Dr. Quincy Simmonds, covering provider, and return call. Patient agreeable.   Dr. Quincy Simmonds -please review and advise.

## 2017-05-09 NOTE — Telephone Encounter (Signed)
Spoke with patient. Advised as seen below per Dr. Quincy Simmonds. Patient denies dizziness or lightheadedness currently. Patient will arrive at office on 4/26 at 8am, is aware will be worked into schedule. Advised may take 800 mg motrin q8h prn for pain.   Routing to provider for final review. Patient is agreeable to disposition. Will close encounter.   Cc: Dr. Sabra Heck
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
## 2017-05-10 ENCOUNTER — Ambulatory Visit: Payer: BC Managed Care – PPO | Admitting: Obstetrics and Gynecology

## 2017-05-10 ENCOUNTER — Other Ambulatory Visit: Payer: Self-pay

## 2017-05-10 ENCOUNTER — Other Ambulatory Visit: Payer: Self-pay | Admitting: Certified Nurse Midwife

## 2017-05-10 ENCOUNTER — Encounter: Payer: Self-pay | Admitting: Obstetrics and Gynecology

## 2017-05-10 VITALS — BP 134/84 | HR 72 | Resp 16 | Ht 64.0 in | Wt 258.2 lb

## 2017-05-10 DIAGNOSIS — N920 Excessive and frequent menstruation with regular cycle: Secondary | ICD-10-CM

## 2017-05-10 DIAGNOSIS — D219 Benign neoplasm of connective and other soft tissue, unspecified: Secondary | ICD-10-CM | POA: Diagnosis not present

## 2017-05-10 DIAGNOSIS — N946 Dysmenorrhea, unspecified: Secondary | ICD-10-CM

## 2017-05-10 DIAGNOSIS — Z1231 Encounter for screening mammogram for malignant neoplasm of breast: Secondary | ICD-10-CM

## 2017-05-10 DIAGNOSIS — N393 Stress incontinence (female) (male): Secondary | ICD-10-CM | POA: Diagnosis not present

## 2017-05-10 LAB — CBC WITH DIFFERENTIAL/PLATELET
BASOS: 0 %
Basophils Absolute: 0 10*3/uL (ref 0.0–0.2)
EOS (ABSOLUTE): 0.2 10*3/uL (ref 0.0–0.4)
Eos: 3 %
Hematocrit: 40.1 % (ref 34.0–46.6)
Hemoglobin: 13.6 g/dL (ref 11.1–15.9)
Lymphocytes Absolute: 2 10*3/uL (ref 0.7–3.1)
Lymphs: 28 %
MCH: 28.6 pg (ref 26.6–33.0)
MCHC: 33.9 g/dL (ref 31.5–35.7)
MCV: 84 fL (ref 79–97)
MONOS ABS: 0.5 10*3/uL (ref 0.1–0.9)
Monocytes: 7 %
NEUTROS ABS: 4.5 10*3/uL (ref 1.4–7.0)
Neutrophils: 62 %
Platelets: 263 10*3/uL (ref 150–379)
RBC: 4.76 x10E6/uL (ref 3.77–5.28)
RDW: 15 % (ref 12.3–15.4)
WBC: 7.4 10*3/uL (ref 3.4–10.8)

## 2017-05-10 NOTE — Patient Instructions (Signed)
Endometriosis Endometriosis is a condition in which the tissue that lines the uterus (endometrium) grows outside of its normal location. The tissue may grow in many locations close to the uterus, but it commonly grows on the ovaries, fallopian tubes, vagina, or bowel. When the uterus sheds the endometrium every menstrual cycle, there is bleeding wherever the endometrial tissue is located. This can cause pain because blood is irritating to tissues that are not normally exposed to it. What are the causes? The cause of endometriosis is not known. What increases the risk? You may be more likely to develop endometriosis if you:  Have a family history of endometriosis.  Have never given birth.  Started your period at age 64 or younger.  Have high levels of estrogen in your body.  Were exposed to a certain medicine (diethylstilbestrol) before you were born (in utero).  Had low birth weight.  Were born as a twin, triplet, or other multiple.  Have a BMI of less than 25. BMI is an estimate of body fat and is calculated from height and weight.  What are the signs or symptoms? Often, there are no symptoms of this condition. If you do have symptoms, they may:  Vary depending on where your endometrial tissue is growing.  Occur during your menstrual period (most common) or midcycle.  Come and go, or you may go months with no symptoms at all.  Stop with menopause.  Symptoms may include:  Pain in the back or abdomen.  Heavier bleeding during periods.  Pain during sex.  Painful bowel movements.  Infertility.  Pelvic pain.  Bleeding more than once a month.  How is this diagnosed? This condition is diagnosed based on your symptoms and a physical exam. You may have tests, such as:  Blood tests and urine tests. These may be done to help rule out other possible causes of your symptoms.  Ultrasound, to look for abnormal tissues.  An X-ray of the lower bowel (barium enema).  An  ultrasound that is done through the vagina (transvaginally).  CT scan.  MRI.  Laparoscopy. In this procedure, a lighted, pencil-sized instrument called a laparoscope is inserted into your abdomen through an incision. The laparoscope allows your health care provider to look at the organs inside your body and check for abnormal tissue to confirm the diagnosis. If abnormal tissue is found, your health care provider may remove a small piece of tissue (biopsy) to be examined under a microscope.  How is this treated? Treatment for this condition may include:  Medicines to relieve pain, such as NSAIDs.  Hormone therapy. This involves using artificial (synthetic) hormones to reduce endometrial tissue growth. Your health care provider may recommend using a hormonal form of birth control, or other medicines.  Surgery. This may be done to remove abnormal endometrial tissue. ? In some cases, tissue may be removed using a laparoscope and a laser (laparoscopic laser treatment). ? In severe cases, surgery may be done to remove the fallopian tubes, uterus, and ovaries (hysterectomy).  Follow these instructions at home:  Take over-the-counter and prescription medicines only as told by your health care provider.  Do not drive or use heavy machinery while taking prescription pain medicine.  Try to avoid activities that cause pain, including sexual activity.  Keep all follow-up visits as told by your health care provider. This is important. Contact a health care provider if:  You have pain in the area between your hip bones (pelvic area) that occurs: ? Before, during, or  after your period. ? In between your period and gets worse during your period. ? During or after sex. ? With bowel movements or urination, especially during your period.  You have problems getting pregnant.  You have a fever. Get help right away if:  You have severe pain that does not get better with medicine.  You have severe  nausea and vomiting, or you cannot eat without vomiting.  You have pain that affects only the lower, right side of your abdomen.  You have abdominal pain that gets worse.  You have abdominal swelling.  You have blood in your stool. This information is not intended to replace advice given to you by your health care provider. Make sure you discuss any questions you have with your health care provider. Document Released: 12/30/1999 Document Revised: 10/07/2015 Document Reviewed: 06/04/2015 Elsevier Interactive Patient Education  [employer].

## 2017-05-13 ENCOUNTER — Telehealth: Payer: Self-pay

## 2017-05-13 ENCOUNTER — Telehealth: Payer: Self-pay | Admitting: Obstetrics & Gynecology

## 2017-05-13 NOTE — Telephone Encounter (Signed)
Dr. Sabra Heck -patient seen in office on 05/10/17 by Dr. Quincy Simmonds. Patient is scheduled for TLH on 07/01/17. Surgical consult on 5/24. See patient message below and advise.

## 2017-05-13 NOTE — Telephone Encounter (Signed)
Spoke with patient and reviewed pre-op instructions and pre/post op appointments. Patient voiced understanding. Mailed patient a copy of her instructions.

## 2017-05-13 NOTE — Telephone Encounter (Signed)
Patient was in to see Dr Quincy Simmonds and she mentioned endometriosis. Patient is wondering if there is a test she can take to determine if she has it.

## 2017-05-13 NOTE — Telephone Encounter (Signed)
Patient was notified she needed surgical clearance from PCP. She will have them mail clearance note to our office.

## 2017-05-14 ENCOUNTER — Encounter: Payer: Self-pay | Admitting: Obstetrics & Gynecology

## 2017-05-14 NOTE — Telephone Encounter (Signed)
Spoke to patient on 05-01-17 and surgery scheduled for 07-01-17. See next encounter for surgery instructions.  Routing to provider for final review. Patient agreeable to disposition. Will close encounter.

## 2017-05-14 NOTE — Telephone Encounter (Signed)
Responded to pt via Mychart.  Ok to close encounter.

## 2017-05-15 NOTE — Telephone Encounter (Signed)
Encounter closed

## 2017-05-27 ENCOUNTER — Emergency Department (HOSPITAL_BASED_OUTPATIENT_CLINIC_OR_DEPARTMENT_OTHER)
Admission: EM | Admit: 2017-05-27 | Discharge: 2017-05-27 | Disposition: A | Discharge status: Home / Self Care | Payer: BC Managed Care – PPO | Attending: Emergency Medicine | Admitting: Emergency Medicine

## 2017-05-27 ENCOUNTER — Encounter (HOSPITAL_BASED_OUTPATIENT_CLINIC_OR_DEPARTMENT_OTHER): Payer: Self-pay

## 2017-05-27 ENCOUNTER — Other Ambulatory Visit: Payer: Self-pay

## 2017-05-27 ENCOUNTER — Emergency Department (HOSPITAL_BASED_OUTPATIENT_CLINIC_OR_DEPARTMENT_OTHER): Payer: BC Managed Care – PPO

## 2017-05-27 DIAGNOSIS — R079 Chest pain, unspecified: Secondary | ICD-10-CM

## 2017-05-27 DIAGNOSIS — Z8673 Personal history of transient ischemic attack (TIA), and cerebral infarction without residual deficits: Secondary | ICD-10-CM | POA: Diagnosis not present

## 2017-05-27 DIAGNOSIS — Z79899 Other long term (current) drug therapy: Secondary | ICD-10-CM | POA: Insufficient documentation

## 2017-05-27 DIAGNOSIS — R0789 Other chest pain: Secondary | ICD-10-CM | POA: Insufficient documentation

## 2017-05-27 DIAGNOSIS — I10 Essential (primary) hypertension: Secondary | ICD-10-CM | POA: Insufficient documentation

## 2017-05-27 LAB — CBC
HCT: 42.7 % (ref 36.0–46.0)
Hemoglobin: 15.4 g/dL — ABNORMAL HIGH (ref 12.0–15.0)
MCH: 29.8 pg (ref 26.0–34.0)
MCHC: 36.1 g/dL — ABNORMAL HIGH (ref 30.0–36.0)
MCV: 82.8 fL (ref 78.0–100.0)
Platelets: 266 10*3/uL (ref 150–400)
RBC: 5.16 MIL/uL — ABNORMAL HIGH (ref 3.87–5.11)
RDW: 14.4 % (ref 11.5–15.5)
WBC: 9.6 10*3/uL (ref 4.0–10.5)

## 2017-05-27 LAB — BASIC METABOLIC PANEL
ANION GAP: 5 (ref 5–15)
BUN: 16 mg/dL (ref 6–20)
CALCIUM: 8.8 mg/dL — AB (ref 8.9–10.3)
CO2: 17 mmol/L — ABNORMAL LOW (ref 22–32)
CREATININE: 0.54 mg/dL (ref 0.44–1.00)
Chloride: 110 mmol/L (ref 101–111)
GFR calc Af Amer: >60 mL/min (ref >60)
GLUCOSE: 83 mg/dL (ref 65–99)
Potassium: 3.5 mmol/L (ref 3.5–5.1)
Sodium: 132 mmol/L — ABNORMAL LOW (ref 135–145)

## 2017-05-27 LAB — TROPONIN I
Troponin I: <0.03 ng/mL (ref <0.03)
Troponin I: <0.03 ng/mL (ref <0.03)

## 2017-05-27 LAB — PREGNANCY, URINE: Preg Test, Ur: NEGATIVE

## 2017-05-27 MED ORDER — KETOROLAC TROMETHAMINE 30 MG/ML IJ SOLN
30.0000 mg | Freq: Once | INTRAMUSCULAR | Status: AC
  Administered 2017-05-27: 30 mg via INTRAVENOUS
  Filled 2017-05-27: qty 1

## 2017-05-27 MED ORDER — FENTANYL CITRATE (PF) 100 MCG/2ML IJ SOLN
50.0000 ug | Freq: Once | INTRAMUSCULAR | Status: AC
  Administered 2017-05-27: 50 ug via INTRAVENOUS
  Filled 2017-05-27: qty 2

## 2017-05-27 MED ORDER — METHOCARBAMOL 500 MG PO TABS: 500.0000 mg | ORAL_TABLET | Freq: Two times a day (BID) | ORAL | 0 refills | Status: DC

## 2017-05-27 NOTE — ED Notes (Signed)
Lab notified add on to last green tube
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
## 2017-05-27 NOTE — ED Triage Notes (Signed)
Pt c/o pain across entire chest-pain started after a fall at work-states she has been seen x 2 for c/o and was advised to f/u if CP continues-NAD-steady gait

## 2017-05-27 NOTE — Discharge Instructions (Signed)
You can take Tylenol or Ibuprofen as directed for pain. You can alternate Tylenol and Ibuprofen every 4 hours. If you take Tylenol at 1pm, then you can take Ibuprofen at 5pm. Then you can take Tylenol again at 9pm.   Take Robaxin as prescribed. This medication will make you drowsy so do not drive or drink alcohol when taking it.  Follow-up with your primary care doctor in the next 2 to 4 days for further evaluation.  I have provided a referral to cardiology if your symptoms continue persist, follow-up with them.  Return to the Emergency Department immediately if you experiencing worsening chest pain, difficulty breathing, nausea/vomiting, get very sweaty, headache or any other worsening or concerning symptoms.

## 2017-05-28 ENCOUNTER — Other Ambulatory Visit: Payer: Self-pay | Admitting: Family Medicine

## 2017-05-28 ENCOUNTER — Telehealth: Payer: Self-pay | Admitting: Obstetrics & Gynecology

## 2017-05-28 ENCOUNTER — Ambulatory Visit
Admission: RE | Admit: 2017-05-28 | Discharge: 2017-05-28 | Disposition: A | Discharge status: Home / Self Care | Payer: BC Managed Care – PPO | Source: Ambulatory Visit | Attending: Family Medicine | Admitting: Family Medicine

## 2017-05-28 DIAGNOSIS — R0789 Other chest pain: Secondary | ICD-10-CM

## 2017-05-28 MED ORDER — IOPAMIDOL (ISOVUE-300) INJECTION 61%
60.0000 mL | Freq: Once | INTRAVENOUS | Status: AC | PRN
  Administered 2017-05-28: 60 mL via INTRAVENOUS

## 2017-05-28 NOTE — Telephone Encounter (Signed)
Spoke with patient. Fall at work on 05/16/17, reports chest pain since fall. Symptoms worsened on 5/13, seen in ER. Patient reports no "broken bones or abnormal labs", f/u with cardiologist and PCP for further evaluation. Patient requesting to schedule [place] at [facility] for further evaluation of gallbladder.  Patient denies any GYN complaints or concerns.   Advised patient to f/u with PCP and cardiologist for further evaluation. Patient is scheduled for TLH on 6/17 with Dr. Sabra Heck. Advised cardio evaluation would need to be completed prior to surgery. Patient states she will call PCP and cardio for appointments now, advised to return call to office with any additional questions/concerns. Patient has contact information for [place]. Will review with Dr. Sabra Heck and return call with any additional recommendations.   Routing to provider for final review. Patient is agreeable to disposition. Will close encounter.

## 2017-05-28 NOTE — Telephone Encounter (Signed)
Patient's husband calling, patient was seen in ER last night for chest pain, patient requesting an appointment for ultrasound.

## 2017-05-29 ENCOUNTER — Ambulatory Visit
Admission: RE | Admit: 2017-05-29 | Discharge: 2017-05-29 | Disposition: A | Discharge status: Home / Self Care | Payer: BC Managed Care – PPO | Source: Ambulatory Visit | Attending: Certified Nurse Midwife | Admitting: Certified Nurse Midwife

## 2017-05-29 DIAGNOSIS — Z1231 Encounter for screening mammogram for malignant neoplasm of breast: Secondary | ICD-10-CM

## 2017-05-29 LAB — HEMOGLOBIN: HEMOGLOBIN: 13.7

## 2017-06-04 NOTE — Progress Notes (Deleted)
Since the patient's H&P were written, there have been no changes to her history or physical exam.  She is here and ready to proceed.  All questions have been answered.

## 2017-06-06 ENCOUNTER — Encounter: Payer: Self-pay | Admitting: Obstetrics & Gynecology

## 2017-06-07 ENCOUNTER — Encounter: Payer: Self-pay | Admitting: Obstetrics & Gynecology

## 2017-06-07 ENCOUNTER — Other Ambulatory Visit (HOSPITAL_COMMUNITY)
Admission: RE | Admit: 2017-06-07 | Discharge: 2017-06-07 | Disposition: A | Discharge status: Home / Self Care | Payer: BC Managed Care – PPO | Source: Ambulatory Visit | Attending: Obstetrics & Gynecology | Admitting: Obstetrics & Gynecology

## 2017-06-07 ENCOUNTER — Ambulatory Visit: Payer: BC Managed Care – PPO | Admitting: Obstetrics & Gynecology

## 2017-06-07 ENCOUNTER — Other Ambulatory Visit: Payer: Self-pay

## 2017-06-07 VITALS — BP 134/80 | HR 76 | Resp 16 | Ht 64.0 in | Wt 251.0 lb

## 2017-06-07 DIAGNOSIS — G43709 Chronic migraine without aura, not intractable, without status migrainosus: Secondary | ICD-10-CM

## 2017-06-07 DIAGNOSIS — D500 Iron deficiency anemia secondary to blood loss (chronic): Secondary | ICD-10-CM

## 2017-06-07 DIAGNOSIS — Z124 Encounter for screening for malignant neoplasm of cervix: Secondary | ICD-10-CM | POA: Diagnosis not present

## 2017-06-07 DIAGNOSIS — IMO0002 Reserved for concepts with insufficient information to code with codable children: Secondary | ICD-10-CM

## 2017-06-07 DIAGNOSIS — D219 Benign neoplasm of connective and other soft tissue, unspecified: Secondary | ICD-10-CM

## 2017-06-07 DIAGNOSIS — N920 Excessive and frequent menstruation with regular cycle: Secondary | ICD-10-CM

## 2017-06-07 MED ORDER — ALPRAZOLAM 0.5 MG PO TABS: 0.5000 mg | ORAL_TABLET | Freq: Every evening | ORAL | 0 refills | Status: DC | PRN
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
## 2017-06-10 ENCOUNTER — Encounter: Payer: Self-pay | Admitting: Obstetrics & Gynecology

## 2017-06-12 LAB — CYTOLOGY - PAP
Diagnosis: NEGATIVE
HPV: NOT DETECTED

## 2017-06-17 ENCOUNTER — Ambulatory Visit: Payer: BC Managed Care – PPO | Admitting: Cardiovascular Disease

## 2017-06-17 ENCOUNTER — Encounter: Payer: Self-pay | Admitting: Cardiology

## 2017-06-17 ENCOUNTER — Ambulatory Visit: Payer: BC Managed Care – PPO | Admitting: Cardiology

## 2017-06-17 VITALS — BP 120/72 | HR 80 | Ht 64.0 in | Wt 257.1 lb

## 2017-06-17 DIAGNOSIS — I10 Essential (primary) hypertension: Secondary | ICD-10-CM | POA: Diagnosis not present

## 2017-06-17 DIAGNOSIS — G4733 Obstructive sleep apnea (adult) (pediatric): Secondary | ICD-10-CM

## 2017-06-17 DIAGNOSIS — R0683 Snoring: Secondary | ICD-10-CM | POA: Diagnosis not present

## 2017-06-17 DIAGNOSIS — Z8673 Personal history of transient ischemic attack (TIA), and cerebral infarction without residual deficits: Secondary | ICD-10-CM | POA: Diagnosis not present
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
## 2017-06-17 NOTE — Patient Instructions (Signed)
Medication Instructions:  Your physician recommends that you continue on your current medications as directed. Please refer to the Current Medication list given to you today.   Labwork: None  Testing/Procedures: You had an EKG today.  Your physician has requested that you have an echocardiogram. Echocardiography is a painless test that uses sound waves to create images of your heart. It provides your doctor with information about the size and shape of your heart and how well your heart's chambers and valves are working. This procedure takes approximately one hour. There are no restrictions for this procedure.  Follow-Up: Your physician recommends that you schedule a follow-up appointment in: 2 months.  If you need a refill on your cardiac medications before your next appointment, please call your pharmacy.   Thank you for choosing [facility]! Robyne Peers, RN [phone]

## 2017-06-18 ENCOUNTER — Encounter: Payer: Self-pay | Admitting: Cardiology

## 2017-06-18 ENCOUNTER — Ambulatory Visit (HOSPITAL_COMMUNITY): Payer: BC Managed Care – PPO | Attending: Internal Medicine

## 2017-06-18 ENCOUNTER — Other Ambulatory Visit: Payer: Self-pay

## 2017-06-18 DIAGNOSIS — I10 Essential (primary) hypertension: Secondary | ICD-10-CM | POA: Diagnosis not present

## 2017-06-18 DIAGNOSIS — Z8673 Personal history of transient ischemic attack (TIA), and cerebral infarction without residual deficits: Secondary | ICD-10-CM

## 2017-06-21 NOTE — Patient Instructions (Addendum)
[name]  06/21/2017     [name]  06/25/2017      Your procedure is scheduled on 07-01-17   Report to [place]  at  5:30A.M.    Call this number if you have problems the morning of surgery:[phone]    OUR ADDRESS IS [address], WE ARE LOCATED IN THE [facility] WITH [facility].   Remember:  Do not eat food or drink liquids after midnight.  Take these medicines the morning of surgery with A SIP OF WATER: SERTRALINE , ZONISAMIDE, XANAX IF NEEDED   Do not wear jewelry, make-up or nail polish.  Do not wear lotions, powders, or perfumes, or deoderant.  Do not shave 48 hours prior to surgery.  Men may shave face and neck.  Do not bring valuables to the hospital.  [facility] is not responsible for any belongings or valuables.  Contacts, dentures or bridgework may not be worn into surgery.  Leave your suitcase in the car.  After surgery it may be brought to your room.  For patients admitted to the hospital, discharge time will be determined by your treatment team.    Special instructions:  N/A  Please read over the following fact sheets that you were given:        [facility] - Preparing for Surgery Before surgery, you can play an important role.  Because skin is not sterile, your skin needs to be as free of germs as possible.  You can reduce the number of germs on your skin by washing with CHG (chlorahexidine gluconate) soap before surgery.  CHG is an antiseptic cleaner which kills germs and bonds with the skin to continue killing germs even after washing. Please DO NOT use if you have an allergy to CHG or antibacterial soaps.  If your skin becomes reddened/irritated stop using the CHG and inform your nurse when you arrive at Short Stay. Do not shave (including legs and underarms) for at least 48 hours prior to the first CHG shower.  You may shave your face/neck. Please follow these instructions carefully:  1.   Shower with CHG Soap the night before surgery and the  morning of Surgery.  2.  If you choose to wash your hair, wash your hair first as usual with your  normal  shampoo.  3.  After you shampoo, rinse your hair and body thoroughly to remove the  shampoo.                           4.  Use CHG as you would any other liquid soap.  You can apply chg directly  to the skin and wash                       Gently with a scrungie or clean washcloth.  5.  Apply the CHG Soap to your body ONLY FROM THE NECK DOWN.   Do not use on face/ open                           Wound or open sores. Avoid contact with eyes, ears mouth and genitals (private parts).                       Wash face,  Genitals (private parts) with your normal soap.             6.  Wash thoroughly, paying special attention to the area where your surgery  will be performed.  7.  Thoroughly rinse your body with warm water from the neck down.  8.  DO NOT shower/wash with your normal soap after using and rinsing off  the CHG Soap.                9.  Pat yourself dry with a clean towel.            10.  Wear clean pajamas.            11.  Place clean sheets on your bed the night of your first shower and do not  sleep with pets. Day of Surgery : Do not apply any lotions/deodorants the morning of surgery.  Please wear clean clothes to the hospital/surgery center.  FAILURE TO FOLLOW THESE INSTRUCTIONS MAY RESULT IN THE CANCELLATION OF YOUR SURGERY PATIENT SIGNATURE_________________________________  NURSE SIGNATURE__________________________________  ________________________________________________________________________

## 2017-06-21 NOTE — Progress Notes (Signed)
LOV/CARDIAC CLEARANCE DR. KRASOWSKI 06-17-17 Epic   LOV/MEDICAL CLEARANCE DR Kaleen Mask 05-15-17 Epic   EKG 06-17-17 Epic   ECHO 06-18-17 Epic   CT CHEST 05-28-17 Epic

## 2017-06-25 ENCOUNTER — Other Ambulatory Visit: Payer: Self-pay

## 2017-06-25 ENCOUNTER — Encounter (HOSPITAL_COMMUNITY): Payer: Self-pay

## 2017-06-25 ENCOUNTER — Encounter (HOSPITAL_COMMUNITY)
Admission: RE | Admit: 2017-06-25 | Discharge: 2017-06-25 | Disposition: A | Discharge status: Home / Self Care | Payer: BC Managed Care – PPO | Source: Ambulatory Visit | Attending: Obstetrics & Gynecology | Admitting: Obstetrics & Gynecology

## 2017-06-25 DIAGNOSIS — Z01812 Encounter for preprocedural laboratory examination: Secondary | ICD-10-CM | POA: Insufficient documentation

## 2017-06-25 HISTORY — DX: Sleep apnea, unspecified: G47.30

## 2017-06-25 LAB — COMPREHENSIVE METABOLIC PANEL
ALBUMIN: 4 g/dL (ref 3.5–5.0)
ALK PHOS: 69 U/L (ref 38–126)
ALT: 21 U/L (ref 14–54)
AST: 19 U/L (ref 15–41)
Anion gap: 7 (ref 5–15)
BUN: 14 mg/dL (ref 6–20)
CALCIUM: 8.9 mg/dL (ref 8.9–10.3)
CHLORIDE: 109 mmol/L (ref 101–111)
CO2: 22 mmol/L (ref 22–32)
CREATININE: 0.63 mg/dL (ref 0.44–1.00)
GFR calc non Af Amer: >60 mL/min (ref >60)
GLUCOSE: 87 mg/dL (ref 65–99)
Potassium: 4.2 mmol/L (ref 3.5–5.1)
SODIUM: 138 mmol/L (ref 135–145)
Total Bilirubin: 0.3 mg/dL (ref 0.3–1.2)
Total Protein: 7 g/dL (ref 6.5–8.1)

## 2017-06-25 LAB — CBC
HCT: 42.2 % (ref 36.0–46.0)
HEMOGLOBIN: 14.5 g/dL (ref 12.0–15.0)
MCH: 30 pg (ref 26.0–34.0)
MCHC: 34.4 g/dL (ref 30.0–36.0)
MCV: 87.2 fL (ref 78.0–100.0)
PLATELETS: 278 10*3/uL (ref 150–400)
RBC: 4.84 MIL/uL (ref 3.87–5.11)
RDW: 14 % (ref 11.5–15.5)
WBC: 9 10*3/uL (ref 4.0–10.5)

## 2017-06-25 LAB — ABO/RH: ABO/RH(D): A POS

## 2017-06-29 ENCOUNTER — Encounter: Payer: Self-pay | Admitting: Obstetrics & Gynecology

## 2017-06-30 ENCOUNTER — Encounter: Payer: Self-pay | Admitting: Obstetrics & Gynecology

## 2017-07-01 ENCOUNTER — Encounter (HOSPITAL_BASED_OUTPATIENT_CLINIC_OR_DEPARTMENT_OTHER)
Admission: RE | Disposition: A | Discharge status: Home / Self Care | Payer: Self-pay | Source: Ambulatory Visit | Attending: Obstetrics & Gynecology

## 2017-07-01 ENCOUNTER — Ambulatory Visit (HOSPITAL_BASED_OUTPATIENT_CLINIC_OR_DEPARTMENT_OTHER): Payer: BC Managed Care – PPO | Admitting: Anesthesiology

## 2017-07-01 ENCOUNTER — Ambulatory Visit (HOSPITAL_BASED_OUTPATIENT_CLINIC_OR_DEPARTMENT_OTHER)
Admission: RE | Admit: 2017-07-01 | Discharge: 2017-07-02 | Disposition: A | Discharge status: Home / Self Care | Payer: BC Managed Care – PPO | Source: Ambulatory Visit | Attending: Obstetrics & Gynecology | Admitting: Obstetrics & Gynecology

## 2017-07-01 ENCOUNTER — Encounter (HOSPITAL_BASED_OUTPATIENT_CLINIC_OR_DEPARTMENT_OTHER): Payer: Self-pay

## 2017-07-01 DIAGNOSIS — N920 Excessive and frequent menstruation with regular cycle: Secondary | ICD-10-CM | POA: Insufficient documentation

## 2017-07-01 DIAGNOSIS — Z79899 Other long term (current) drug therapy: Secondary | ICD-10-CM | POA: Insufficient documentation

## 2017-07-01 DIAGNOSIS — N800 Endometriosis of uterus: Secondary | ICD-10-CM | POA: Insufficient documentation

## 2017-07-01 DIAGNOSIS — F419 Anxiety disorder, unspecified: Secondary | ICD-10-CM | POA: Diagnosis not present

## 2017-07-01 DIAGNOSIS — D259 Leiomyoma of uterus, unspecified: Secondary | ICD-10-CM | POA: Diagnosis present

## 2017-07-01 DIAGNOSIS — N921 Excessive and frequent menstruation with irregular cycle: Secondary | ICD-10-CM | POA: Diagnosis not present

## 2017-07-01 DIAGNOSIS — Z885 Allergy status to narcotic agent status: Secondary | ICD-10-CM | POA: Insufficient documentation

## 2017-07-01 DIAGNOSIS — D251 Intramural leiomyoma of uterus: Secondary | ICD-10-CM | POA: Insufficient documentation

## 2017-07-01 DIAGNOSIS — N809 Endometriosis, unspecified: Secondary | ICD-10-CM | POA: Diagnosis not present

## 2017-07-01 DIAGNOSIS — Z8673 Personal history of transient ischemic attack (TIA), and cerebral infarction without residual deficits: Secondary | ICD-10-CM | POA: Diagnosis not present

## 2017-07-01 DIAGNOSIS — D500 Iron deficiency anemia secondary to blood loss (chronic): Secondary | ICD-10-CM | POA: Diagnosis not present

## 2017-07-01 DIAGNOSIS — D509 Iron deficiency anemia, unspecified: Secondary | ICD-10-CM | POA: Insufficient documentation

## 2017-07-01 HISTORY — PX: LAPAROSCOPIC HYSTERECTOMY: SHX1926

## 2017-07-01 HISTORY — PX: LAPAROSCOPIC BILATERAL SALPINGECTOMY: SHX5889

## 2017-07-01 HISTORY — PX: CYSTOSCOPY: SHX5120

## 2017-07-01 LAB — TYPE AND SCREEN
ABO/RH(D): A POS
ANTIBODY SCREEN: NEGATIVE

## 2017-07-01 LAB — POCT PREGNANCY, URINE: PREG TEST UR: NEGATIVE

## 2017-07-01 SURGERY — HYSTERECTOMY, TOTAL, LAPAROSCOPIC
Anesthesia: General

## 2017-07-01 MED ORDER — SUGAMMADEX SODIUM 200 MG/2ML IV SOLN
INTRAVENOUS | Status: DC | PRN
  Administered 2017-07-01: 200 mg via INTRAVENOUS

## 2017-07-01 MED ORDER — PROPOFOL 10 MG/ML IV BOLUS
INTRAVENOUS | Status: AC
  Filled 2017-07-01: qty 40

## 2017-07-01 MED ORDER — KETOROLAC TROMETHAMINE 30 MG/ML IJ SOLN
INTRAMUSCULAR | Status: AC
  Filled 2017-07-01: qty 1

## 2017-07-01 MED ORDER — ENOXAPARIN SODIUM 40 MG/0.4ML ~~LOC~~ SOLN
SUBCUTANEOUS | Status: AC
  Filled 2017-07-01: qty 0.4

## 2017-07-01 MED ORDER — ENOXAPARIN SODIUM 30 MG/0.3ML ~~LOC~~ SOLN
30.0000 mg | SUBCUTANEOUS | Status: DC
  Filled 2017-07-01: qty 0.3

## 2017-07-01 MED ORDER — HYDROMORPHONE HCL 1 MG/ML IJ SOLN
INTRAMUSCULAR | Status: AC
  Filled 2017-07-01: qty 1

## 2017-07-01 MED ORDER — FAMOTIDINE IN NACL 20-0.9 MG/50ML-% IV SOLN
20.0000 mg | Freq: Two times a day (BID) | INTRAVENOUS | Status: DC
  Administered 2017-07-01 (×2): 20 mg via INTRAVENOUS
  Filled 2017-07-01 (×3): qty 50

## 2017-07-01 MED ORDER — EPINEPHRINE PF 1 MG/10ML IJ SOSY
PREFILLED_SYRINGE | INTRAMUSCULAR | Status: AC
  Filled 2017-07-01: qty 10

## 2017-07-01 MED ORDER — ROCURONIUM BROMIDE 100 MG/10ML IV SOLN
INTRAVENOUS | Status: AC
  Filled 2017-07-01: qty 1

## 2017-07-01 MED ORDER — EPINEPHRINE PF 1 MG/10ML IJ SOSY
PREFILLED_SYRINGE | INTRAMUSCULAR | Status: DC | PRN
  Administered 2017-07-01: 20 ug via INTRAVENOUS

## 2017-07-01 MED ORDER — SODIUM CHLORIDE 0.9 % IV SOLN
INTRAVENOUS | Status: AC
  Filled 2017-07-01: qty 2

## 2017-07-01 MED ORDER — METOCLOPRAMIDE HCL 5 MG/ML IJ SOLN
INTRAMUSCULAR | Status: AC
  Filled 2017-07-01: qty 2

## 2017-07-01 MED ORDER — OXYCODONE HCL 5 MG PO TABS
5.0000 mg | ORAL_TABLET | Freq: Once | ORAL | Status: DC | PRN
  Filled 2017-07-01: qty 1

## 2017-07-01 MED ORDER — SODIUM CHLORIDE 0.9 % IV SOLN
2.0000 g | INTRAVENOUS | Status: AC
  Administered 2017-07-01: 2 g via INTRAVENOUS
  Filled 2017-07-01: qty 2

## 2017-07-01 MED ORDER — FENTANYL CITRATE (PF) 100 MCG/2ML IJ SOLN
INTRAMUSCULAR | Status: DC | PRN
  Administered 2017-07-01: 50 ug via INTRAVENOUS
  Administered 2017-07-01 (×2): 25 ug via INTRAVENOUS
  Administered 2017-07-01 (×5): 50 ug via INTRAVENOUS

## 2017-07-01 MED ORDER — HYDROMORPHONE HCL 1 MG/ML IJ SOLN
0.2500 mg | INTRAMUSCULAR | Status: DC | PRN
  Administered 2017-07-01 (×2): 0.5 mg via INTRAVENOUS
  Filled 2017-07-01: qty 0.5

## 2017-07-01 MED ORDER — MEPERIDINE HCL 25 MG/ML IJ SOLN
6.2500 mg | INTRAMUSCULAR | Status: DC | PRN
  Filled 2017-07-01: qty 1

## 2017-07-01 MED ORDER — SIMETHICONE 80 MG PO CHEW
80.0000 mg | CHEWABLE_TABLET | Freq: Four times a day (QID) | ORAL | Status: DC | PRN
  Filled 2017-07-01: qty 1

## 2017-07-01 MED ORDER — ALUM & MAG HYDROXIDE-SIMETH 200-200-20 MG/5ML PO SUSP
30.0000 mL | ORAL | Status: DC | PRN
  Filled 2017-07-01: qty 30

## 2017-07-01 MED ORDER — ALBUTEROL SULFATE HFA 108 (90 BASE) MCG/ACT IN AERS
INHALATION_SPRAY | RESPIRATORY_TRACT | Status: AC
  Filled 2017-07-01: qty 6.7

## 2017-07-01 MED ORDER — KETOROLAC TROMETHAMINE 30 MG/ML IJ SOLN
INTRAMUSCULAR | Status: DC | PRN
  Administered 2017-07-01: 30 mg via INTRAVENOUS

## 2017-07-01 MED ORDER — ROCURONIUM BROMIDE 100 MG/10ML IV SOLN
INTRAVENOUS | Status: DC | PRN
  Administered 2017-07-01 (×3): 10 mg via INTRAVENOUS
  Administered 2017-07-01: 50 mg via INTRAVENOUS

## 2017-07-01 MED ORDER — METOCLOPRAMIDE HCL 5 MG/ML IJ SOLN
INTRAMUSCULAR | Status: DC | PRN
  Administered 2017-07-01: 10 mg via INTRAVENOUS

## 2017-07-01 MED ORDER — KETOROLAC TROMETHAMINE 30 MG/ML IJ SOLN
INTRAMUSCULAR | Status: AC
Start: 2017-07-01 — End: ?
  Filled 2017-07-01: qty 1

## 2017-07-01 MED ORDER — HYDROMORPHONE HCL 1 MG/ML IJ SOLN
0.5000 mg | INTRAMUSCULAR | Status: DC | PRN
  Administered 2017-07-01 (×2): 0.5 mg via INTRAVENOUS
  Filled 2017-07-01: qty 0.5

## 2017-07-01 MED ORDER — PROMETHAZINE HCL 25 MG/ML IJ SOLN
6.2500 mg | INTRAMUSCULAR | Status: DC | PRN
  Filled 2017-07-01: qty 1

## 2017-07-01 MED ORDER — ONDANSETRON HCL 4 MG/2ML IJ SOLN
INTRAMUSCULAR | Status: DC | PRN
  Administered 2017-07-01: 4 mg via INTRAVENOUS

## 2017-07-01 MED ORDER — OXYCODONE HCL 5 MG/5ML PO SOLN
5.0000 mg | Freq: Once | ORAL | Status: DC | PRN
  Filled 2017-07-01: qty 5

## 2017-07-01 MED ORDER — KETOROLAC TROMETHAMINE 30 MG/ML IJ SOLN
30.0000 mg | Freq: Four times a day (QID) | INTRAMUSCULAR | Status: DC
  Administered 2017-07-01 – 2017-07-02 (×3): 30 mg via INTRAVENOUS
  Filled 2017-07-01: qty 1

## 2017-07-01 MED ORDER — SODIUM CHLORIDE 0.9 % IV SOLN
INTRAVENOUS | Status: DC | PRN
  Administered 2017-07-01: 60 mL

## 2017-07-01 MED ORDER — LIDOCAINE HCL (CARDIAC) PF 100 MG/5ML IV SOSY
PREFILLED_SYRINGE | INTRAVENOUS | Status: DC | PRN
  Administered 2017-07-01: 100 mg via INTRAVENOUS

## 2017-07-01 MED ORDER — GLYCOPYRROLATE 0.2 MG/ML IJ SOLN
INTRAMUSCULAR | Status: DC | PRN
  Administered 2017-07-01: 0.2 mg via INTRAVENOUS

## 2017-07-01 MED ORDER — MIDAZOLAM HCL 2 MG/2ML IJ SOLN
INTRAMUSCULAR | Status: AC
  Filled 2017-07-01: qty 2

## 2017-07-01 MED ORDER — HYDROMORPHONE HCL 2 MG PO TABS
2.0000 mg | ORAL_TABLET | ORAL | Status: DC | PRN
  Administered 2017-07-01 – 2017-07-02 (×3): 2 mg via ORAL
  Filled 2017-07-01: qty 1

## 2017-07-01 MED ORDER — ALBUTEROL SULFATE HFA 108 (90 BASE) MCG/ACT IN AERS
INHALATION_SPRAY | RESPIRATORY_TRACT | Status: DC | PRN
  Administered 2017-07-01: 4 via RESPIRATORY_TRACT
  Administered 2017-07-01: 2 via RESPIRATORY_TRACT
  Administered 2017-07-01: 4 via RESPIRATORY_TRACT

## 2017-07-01 MED ORDER — DEXTROSE-NACL 5-0.45 % IV SOLN
INTRAVENOUS | Status: DC
  Filled 2017-07-01: qty 1000

## 2017-07-01 MED ORDER — LACTATED RINGERS IV SOLN
INTRAVENOUS | Status: DC
  Administered 2017-07-01 (×3): via INTRAVENOUS
  Filled 2017-07-01: qty 1000

## 2017-07-01 MED ORDER — MENTHOL 3 MG MT LOZG
1.0000 | LOZENGE | OROMUCOSAL | Status: DC | PRN
  Filled 2017-07-01: qty 9

## 2017-07-01 MED ORDER — PROPOFOL 10 MG/ML IV BOLUS
INTRAVENOUS | Status: DC | PRN
  Administered 2017-07-01: 50 mg via INTRAVENOUS
  Administered 2017-07-01: 200 mg via INTRAVENOUS

## 2017-07-01 MED ORDER — FENTANYL CITRATE (PF) 100 MCG/2ML IJ SOLN
INTRAMUSCULAR | Status: AC
  Filled 2017-07-01: qty 2

## 2017-07-01 MED ORDER — GLYCOPYRROLATE PF 0.2 MG/ML IJ SOSY
PREFILLED_SYRINGE | INTRAMUSCULAR | Status: AC
  Filled 2017-07-01: qty 1

## 2017-07-01 MED ORDER — MIDAZOLAM HCL 5 MG/5ML IJ SOLN
INTRAMUSCULAR | Status: DC | PRN
  Administered 2017-07-01: 2 mg via INTRAVENOUS

## 2017-07-01 MED ORDER — ENOXAPARIN SODIUM 40 MG/0.4ML ~~LOC~~ SOLN
40.0000 mg | SUBCUTANEOUS | Status: AC
  Administered 2017-07-01: 40 mg via SUBCUTANEOUS
  Filled 2017-07-01: qty 0.4

## 2017-07-01 MED ORDER — ACETAMINOPHEN 325 MG PO TABS
650.0000 mg | ORAL_TABLET | ORAL | Status: DC | PRN
  Filled 2017-07-01: qty 2

## 2017-07-01 MED ORDER — DEXAMETHASONE SODIUM PHOSPHATE 4 MG/ML IJ SOLN
INTRAMUSCULAR | Status: DC | PRN
  Administered 2017-07-01: 10 mg via INTRAVENOUS

## 2017-07-01 MED ORDER — HYDROMORPHONE HCL 2 MG PO TABS
ORAL_TABLET | ORAL | Status: AC
  Filled 2017-07-01: qty 1

## 2017-07-01 MED ORDER — OXYCODONE HCL 5 MG PO TABS
ORAL_TABLET | ORAL | Status: AC
  Filled 2017-07-01: qty 1

## 2017-07-01 MED ORDER — ALBUTEROL SULFATE (2.5 MG/3ML) 0.083% IN NEBU
2.5000 mg | INHALATION_SOLUTION | RESPIRATORY_TRACT | Status: DC | PRN
  Filled 2017-07-01: qty 3

## 2017-07-01 MED ORDER — LIDOCAINE 2% (20 MG/ML) 5 ML SYRINGE
INTRAMUSCULAR | Status: AC
  Filled 2017-07-01: qty 5

## 2017-07-01 MED ORDER — BUPIVACAINE HCL (PF) 0.25 % IJ SOLN
INTRAMUSCULAR | Status: DC | PRN
  Administered 2017-07-01: 13 mL

## 2017-07-01 MED ORDER — KETOROLAC TROMETHAMINE 30 MG/ML IJ SOLN
30.0000 mg | Freq: Four times a day (QID) | INTRAMUSCULAR | Status: DC
  Filled 2017-07-01 (×3): qty 1

## 2017-07-01 MED ORDER — FENTANYL CITRATE (PF) 250 MCG/5ML IJ SOLN
INTRAMUSCULAR | Status: AC
  Filled 2017-07-01: qty 5

## 2017-07-01 SURGICAL SUPPLY — 83 items
APPLICATOR ARISTA FLEXITIP XL (MISCELLANEOUS) ×4 IMPLANT
BAG RETRIEVAL 10MM (BASKET)
BENZOIN TINCTURE PRP APPL 2/3 (GAUZE/BANDAGES/DRESSINGS) IMPLANT
BLADE CLIPPER SURG (BLADE) IMPLANT
CABLE HIGH FREQUENCY MONO STRZ (ELECTRODE) ×4 IMPLANT
CANISTER SUCT 3000ML PPV (MISCELLANEOUS) ×4 IMPLANT
CANISTER SUCTION 1200CC (MISCELLANEOUS) IMPLANT
CATH ROBINSON RED A/P 16FR (CATHETERS) ×4 IMPLANT
CLOSURE WOUND 1/4X4 (GAUZE/BANDAGES/DRESSINGS)
CONT SPECI 4OZ STER CLIK (MISCELLANEOUS) ×4 IMPLANT
COVER BACK TABLE 80X110 HD (DRAPES) ×4 IMPLANT
COVER MAYO STAND STRL (DRAPES) ×4 IMPLANT
COVER SURGICAL LIGHT HANDLE (MISCELLANEOUS) IMPLANT
DECANTER SPIKE VIAL GLASS SM (MISCELLANEOUS) IMPLANT
DERMABOND ADVANCED (GAUZE/BANDAGES/DRESSINGS) ×2
DERMABOND ADVANCED .7 DNX12 (GAUZE/BANDAGES/DRESSINGS) ×2 IMPLANT
DILATOR CANAL MILEX (MISCELLANEOUS) IMPLANT
DRSG COVADERM PLUS 2X2 (GAUZE/BANDAGES/DRESSINGS) IMPLANT
DRSG OPSITE POSTOP 3X4 (GAUZE/BANDAGES/DRESSINGS) IMPLANT
DRSG TELFA 3X8 NADH (GAUZE/BANDAGES/DRESSINGS) IMPLANT
DURAPREP 26ML APPLICATOR (WOUND CARE) ×4 IMPLANT
GLOVE BIOGEL PI IND STRL 7.0 (GLOVE) ×8 IMPLANT
GLOVE BIOGEL PI INDICATOR 7.0 (GLOVE) ×8
GLOVE ECLIPSE 6.5 STRL STRAW (GLOVE) ×16 IMPLANT
GLOVE INDICATOR 7.0 STRL GRN (GLOVE) ×12 IMPLANT
GOWN STRL REUS W/TWL LRG LVL3 (GOWN DISPOSABLE) ×8 IMPLANT
GOWN STRL REUS W/TWL XL LVL3 (GOWN DISPOSABLE) ×16 IMPLANT
HEMOSTAT ARISTA ABSORB 3G PWDR (MISCELLANEOUS) ×4 IMPLANT
KIT TURNOVER CYSTO (KITS) ×4 IMPLANT
LIGASURE VESSEL 5MM BLUNT TIP (ELECTROSURGICAL) ×4 IMPLANT
NEEDLE HYPO 25X1 1.5 SAFETY (NEEDLE) IMPLANT
NEEDLE INSUFFLATION 120MM (ENDOMECHANICALS) ×4 IMPLANT
NEEDLE INSUFFLATION 14GA 150MM (NEEDLE) IMPLANT
NS IRRIG 1000ML POUR BTL (IV SOLUTION) ×4 IMPLANT
NS IRRIG 500ML POUR BTL (IV SOLUTION) ×4 IMPLANT
OCCLUDER COLPOPNEUMO (BALLOONS) ×4 IMPLANT
PACK LAPAROSCOPY BASIN (CUSTOM PROCEDURE TRAY) ×4 IMPLANT
PACK TRENDGUARD 450 HYBRID PRO (MISCELLANEOUS) ×2 IMPLANT
PAD OB MATERNITY 4.3X12.25 (PERSONAL CARE ITEMS) ×4 IMPLANT
POUCH LAPAROSCOPIC INSTRUMENT (MISCELLANEOUS) ×4 IMPLANT
PROTECTOR NERVE ULNAR (MISCELLANEOUS) ×8 IMPLANT
SCISSORS LAP 5X35 DISP (ENDOMECHANICALS) IMPLANT
SEALER TISSUE G2 CVD JAW 35 (ENDOMECHANICALS) IMPLANT
SEALER TISSUE G2 CVD JAW 45CM (ENDOMECHANICALS)
SET IRRIG TUBING LAPAROSCOPIC (IRRIGATION / IRRIGATOR) ×4 IMPLANT
SET IRRIG Y TYPE TUR BLADDER L (SET/KITS/TRAYS/PACK) ×4 IMPLANT
SET TRI-LUMEN FLTR TB AIRSEAL (TUBING) ×4 IMPLANT
SHEARS HARMONIC ACE PLUS 36CM (ENDOMECHANICALS) ×4 IMPLANT
SLEEVE ADV FIXATION 5X100MM (TROCAR) ×4 IMPLANT
SOLUTION ELECTROLUBE (MISCELLANEOUS) IMPLANT
STRIP CLOSURE SKIN 1/4X4 (GAUZE/BANDAGES/DRESSINGS) IMPLANT
SUT VIC AB 0 CT1 27 (SUTURE) ×4
SUT VIC AB 0 CT1 27XBRD ANBCTR (SUTURE) ×4 IMPLANT
SUT VIC AB 4-0 SH 27 (SUTURE)
SUT VIC AB 4-0 SH 27XANBCTRL (SUTURE) IMPLANT
SUT VICRYL 0 UR6 27IN ABS (SUTURE) IMPLANT
SUT VICRYL 4-0 PS2 18IN ABS (SUTURE) ×4 IMPLANT
SUT VLOC 180 0 9IN  GS21 (SUTURE) ×4
SUT VLOC 180 0 9IN GS21 (SUTURE) ×4 IMPLANT
SYR 10ML LL (SYRINGE) ×4 IMPLANT
SYR 30ML LL (SYRINGE) IMPLANT
SYR 3ML 23GX1 SAFETY (SYRINGE) IMPLANT
SYR 50ML LL SCALE MARK (SYRINGE) ×8 IMPLANT
SYS BAG RETRIEVAL 10MM (BASKET)
SYSTEM BAG RETRIEVAL 10MM (BASKET) IMPLANT
SYSTEM CARTER THOMASON II (TROCAR) IMPLANT
TIP UTERINE 5.1X6CM LAV DISP (MISCELLANEOUS) IMPLANT
TIP UTERINE 6.7X10CM GRN DISP (MISCELLANEOUS) IMPLANT
TIP UTERINE 6.7X6CM WHT DISP (MISCELLANEOUS) IMPLANT
TIP UTERINE 6.7X8CM BLUE DISP (MISCELLANEOUS) ×4 IMPLANT
TOWEL OR 17X24 6PK STRL BLUE (TOWEL DISPOSABLE) ×8 IMPLANT
TRAY FOLEY CATH SILVER 14FR (SET/KITS/TRAYS/PACK) ×4 IMPLANT
TRENDGUARD 450 HYBRID PRO PACK (MISCELLANEOUS) ×4
TROCAR ADV FIXATION 5X100MM (TROCAR) ×4 IMPLANT
TROCAR PORT AIRSEAL 5X120 (TROCAR) ×4 IMPLANT
TROCAR XCEL NON BLADE 8MM B8LT (ENDOMECHANICALS) ×4 IMPLANT
TROCAR XCEL NON-BLD 11X100MML (ENDOMECHANICALS) IMPLANT
TROCAR XCEL NON-BLD 5MMX100MML (ENDOMECHANICALS) ×4 IMPLANT
TUBE CONNECTING 12'X1/4 (SUCTIONS)
TUBE CONNECTING 12X1/4 (SUCTIONS) IMPLANT
TUBING INSUF HEATED (TUBING) IMPLANT
WARMER LAPAROSCOPE (MISCELLANEOUS) ×8 IMPLANT
WATER STERILE IRR 500ML POUR (IV SOLUTION) ×4 IMPLANT

## 2017-07-01 NOTE — Progress Notes (Signed)
Day of Surgery Procedure(s) (LRB): HYSTERECTOMY TOTAL LAPAROSCOPIC (N/A) LAPAROSCOPIC BILATERAL SALPINGECTOMY (Bilateral) CYSTOSCOPY (N/A)  Subjective: Patient reports good pain control.  No nausea.  Eating and drinking.  Has walked.  Voided 250cc.  Issues with reactive airway during intubation discussed as well as pulmonology follow-up.  Questions answered.  Pictures from surgery reviewed.  Objective: I have reviewed patient's vital signs, intake and output, medications and labs. Vitals:   07/01/17 1415 07/01/17 1528  BP: 113/61 127/83  Pulse:    Resp: 16 16  Temp: (!) 97.1 F (36.2 C) 97.7 F (36.5 C)  SpO2: 100% 98%    General: alert and cooperative Resp: clear to auscultation bilaterally Cardio: regular rate and rhythm, S1, S2 normal, no murmur, click, rub or gallop GI: soft, non-tender; bowel sounds normal; no masses,  no organomegaly and incision: clean, dry and intact Extremities: extremities normal, atraumatic, no cyanosis or edema Vaginal Bleeding: minimal   Assessment: s/p Procedure(s) with comments: HYSTERECTOMY TOTAL LAPAROSCOPIC (N/A) LAPAROSCOPIC BILATERAL SALPINGECTOMY (Bilateral) CYSTOSCOPY (N/A) - possible cysto: stable and progressing well  Plan: Advance diet Encourage ambulation Advance to PO medication CBC and CMP in AM  LOS: 0 days    Megan Salon 07/01/2017, 6:57 PM

## 2017-07-01 NOTE — Anesthesia Procedure Notes (Signed)
Procedure Name: Intubation Date/Time: 07/01/2017 7:40 AM Performed by: Justice Rocher, CRNA Pre-anesthesia Checklist: Patient identified, Emergency Drugs available, Suction available and Patient being monitored Patient Re-evaluated:Patient Re-evaluated prior to induction Oxygen Delivery Method: Circle system utilized Preoxygenation: Pre-oxygenation with 100% oxygen Induction Type: IV induction Ventilation: Mask ventilation without difficulty Laryngoscope Size: Mac and 4 Grade View: Grade II Tube type: Oral Tube size: 7.0 mm Number of attempts: 1 Airway Equipment and Method: Stylet and Oral airway Placement Confirmation: ETT inserted through vocal cords under direct vision,  positive ETCO2 and breath sounds checked- equal and bilateral Secured at: 23 cm Tube secured with: Tape Dental Injury: Teeth and Oropharynx as per pre-operative assessment  Difficulty Due To: Difficulty was unanticipated Comments: Required Albuterol via ETT and Epi IV post intubation and suctioning ETT for better TV and ventilations. Dr Candida Peeling at bedside assisting  As well as Dr Sabra Heck ( surgeon )

## 2017-07-01 NOTE — Anesthesia Preprocedure Evaluation (Addendum)
Anesthesia Evaluation  Patient identified by MRN, date of birth, ID band Patient awake    Reviewed: Allergy & Precautions, H&P , NPO status , Patient's Chart, lab work & pertinent test results, reviewed documented beta blocker date and time   Airway Mallampati: II  TM Distance: >3 FB Neck ROM: full    Dental no notable dental hx.    Pulmonary neg pulmonary ROS, sleep apnea ,    Pulmonary exam normal breath sounds clear to auscultation       Cardiovascular hypertension, Pt. on medications negative cardio ROS Normal cardiovascular exam Rhythm:Regular Rate:Normal     Neuro/Psych  Headaches, Anxiety CVA, No Residual Symptoms negative neurological ROS  negative psych ROS   GI/Hepatic Neg liver ROS, GERD  Medicated and Controlled,  Endo/Other  Morbid obesity  Renal/GU negative Renal ROS  negative genitourinary   Musculoskeletal   Abdominal (+) + obese,   Peds  Hematology negative hematology ROS (+)   Anesthesia Other Findings See surgeon's H&P   Reproductive/Obstetrics negative OB ROS                             Anesthesia Physical  Anesthesia Plan  ASA: III  Anesthesia Plan: General   Post-op Pain Management:    Induction: Intravenous  PONV Risk Score and Plan: 3 and Ondansetron, Dexamethasone, Midazolam and Scopolamine patch - Pre-op  Airway Management Planned: Oral ETT  Additional Equipment:   Intra-op Plan:   Post-operative Plan: Extubation in OR  Informed Consent: I have reviewed the patients History and Physical, chart, labs and discussed the procedure including the risks, benefits and alternatives for the proposed anesthesia with the patient or authorized representative who has indicated his/her understanding and acceptance.   Dental Advisory Given  Plan Discussed with: CRNA and Surgeon  Anesthesia Plan Comments:        Anesthesia Quick Evaluation

## 2017-07-01 NOTE — Transfer of Care (Signed)
Immediate Anesthesia Transfer of Care Note  Patient: [name]  Procedure(s) Performed: Procedure(s) (LRB): HYSTERECTOMY TOTAL LAPAROSCOPIC (N/A) LAPAROSCOPIC BILATERAL SALPINGECTOMY (Bilateral) CYSTOSCOPY (N/A)  Patient Location: PACU  Anesthesia Type: General  Level of Consciousness: awake, sedated, patient cooperative and responds to stimulation  Airway & Oxygen Therapy: Patient Spontanous Breathing and Patient connected to [place] O2  Post-op Assessment: Report given to PACU RN, Post -op Vital signs reviewed and stable and Patient moving all extremities  Post vital signs: Reviewed and stable  Complications: No apparent anesthesia complications

## 2017-07-01 NOTE — Anesthesia Postprocedure Evaluation (Signed)
Anesthesia Post Note  Patient: [name]  Procedure(s) Performed: HYSTERECTOMY TOTAL LAPAROSCOPIC (N/A ) LAPAROSCOPIC BILATERAL SALPINGECTOMY (Bilateral ) CYSTOSCOPY (N/A )     Patient location during evaluation: PACU Anesthesia Type: General Level of consciousness: awake and alert Pain management: pain level controlled Vital Signs Assessment: post-procedure vital signs reviewed and stable Respiratory status: spontaneous breathing, nonlabored ventilation and respiratory function stable Cardiovascular status: blood pressure returned to baseline and stable Postop Assessment: no apparent nausea or vomiting Anesthetic complications: no    Last Vitals:  Vitals:   07/01/17 1200 07/01/17 1215  BP: 136/76   Pulse:    Resp: 20   Temp: (!) 36.2 C   SpO2: 91% 98%    Last Pain:  Vitals:   07/01/17 1200  TempSrc:   PainSc: [place]
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
## 2017-07-01 NOTE — Progress Notes (Signed)
RT called to assess patient post-op. Patient resting comfortably at this time with no distress noted. Patient BBS clear and O2 sats 96% on RA. Patient ordered Albuterol Q4 PRN for SOB/wheezing. RT available as needed.

## 2017-07-01 NOTE — Op Note (Signed)
07/01/2017  11:29 AM  PATIENT:  [name]  42 y.o. female  PRE-OPERATIVE DIAGNOSIS:  menorrhagia, fibroid, anemia, h/o iron deficiency  POST-OPERATIVE DIAGNOSIS:  menorrhagia, fibroid, anemia, h/o iron deficiency, endometriosis  PROCEDURE:  Procedure(s): HYSTERECTOMY TOTAL LAPAROSCOPIC LAPAROSCOPIC BILATERAL SALPINGECTOMY CYSTOSCOPY  SURGEON:  Megan Salon  ASSISTANTS: Sumner Boast, MD   ANESTHESIA:   general  ESTIMATED BLOOD LOSS: 100 mL  BLOOD ADMINISTERED:none   FLUIDS: 2000ccLR  UOP: 200cc clear UOP  SPECIMEN:  Uterus, cervix and bilateral fallopian tubes  DISPOSITION OF SPECIMEN:  PATHOLOGY  FINDINGS: endometriosis on the anterior side of the uterus and the left side of the uterus, normal ovaries, normal fallopian tubes, normal upper abdomen, anterior lower uterine segment fibroid  DESCRIPTION OF OPERATION: Patient is taken to the operating room. She is placed in the supine position. She is a running IV in place. Informed consent was present on the chart. SCDs on her lower extremities and functioning properly. Patient was positioned while she was awake.  Her legs were placed in the low lithotomy position in [place]. Her arms were tucked by the side.  General endotracheal anesthesia was administered by the anesthesia staff without difficulty. Dr. Sabra Heck, anesthesia, oversaw case.  Right after intubation, pt began to have what appeared to be a reactive airway change with wheezing and low tidal volumes.  She was given albuterol inhaler through the ET tube as well as epinephrine.  She stabilized quickly and was stable throughout the procedure.  Anesthesia felt it was safe to proceed.  Time out performed.  Chlora prep was then used to prep the abdomen and Betadine was used to prep the inner thighs, perineum and vagina. Once 3 minutes had past the patient was draped in a normal standard fashion. The legs were lifted to the high lithotomy position. The cervix was  visualized by placing a heavy weighted speculum in the posterior aspect of the vagina and using a curved Deaver retractor to the retract anteriorly. The anterior lip of the cervix was grasped with single-tooth tenaculum.  The cervix sounded to 8.5 cm. Pratt dilators were used to dilate the cervix up to a #21. A RUMI uterine manipulator was obtained. A #8 disposable tip was placed on the RUMI manipulator as well as a 3.0, silver KOH ring. This was passed through the cervix and the bulb of the disposable tip was inflated with 10 cc of normal saline. There was a good fit of the KOH ring around the cervix. The tenaculum was removed. There is also good manipulation of the uterus. The speculum and retractor were removed as well. A Foley catheter was placed to straight drain.  Clear urine was noted. Legs were lowered to the low lithotomy position and attention was turned the abdomen.  The umbilicus was everted.  A Veress needle was obtained. Syringe of sterile saline was placed on a open Veress needle.  This was passed into the umbilicus until just when the fluid started to drip.  Then low flow CO2 gas was attached the needle and the pneumoperitoneum was achieved without difficulty. Once four liters of gas was in the abdomen the Veress needle was removed and a 5 millimeter non-bladed Optiview trocar and port were passed directly to the abdomen. The laparoscope was then used to confirm intraperitoneal placement. A large uterus with fundal fibroid was noted.  There was some endometriosis appearing tissue on the back of the cervix.  Ovaries were normal.  Are where prior tubal reanastomosis had been  performed was seen and looks really nice, without any adhesions.  Locations for RLQ, LLQ, and suprapubic ports were noted by transillumination of the abdominal wall.  0.25% marcaine was used to anesthetize the skin.  63mm skin incision was made in the RLQ and an AirSeal port was placed underdirect visualization of the laparoscope.   Then a 53mm skin incision was made and a 98mm nonbladed trochar and port was placed in the LLQ.  Finally, and 23mm skin incision was made about 4cm above the pubic symphasis and an 39mm non-bladed port was placed with direct visualization of the laparoscope.  All trochars were removed.    Endometriosis was noted on the anterior surface of the uterus with peritoneum pulled into that direction.  As well there was some endometriosis on the left side of the uterus.  The ovaries were completely normal.  The tubes were normal.  The upper abdomen was normal.  There was an anterior fibroid that was located in the lower uterus segment of the uterus.  There were no other abnormal findings.  Attention was turned to the right side. With uterus on stretch the right tube was excised off the ovary and mesosalpinx was dissected to free the tube. Then the right utero-ovarian pedicle was serially clamped cauterized and incised using the ligasure device. Right round ligament was serially clamped cauterized and incised. The anterior and posterior peritoneum of the inferior leaf of the broad ligament were opened.  As this was above the level of the fibroid, this was difficult as we were likely not in the correct plane.  So, attention was turned to the left side.  Attention was turned the left side.  The uterus was placed on stretch to the opposite side.  The tube was excised off the ovary using sharp dissection a bipolar cautery.  The mesosalpinx was incised freeing the tube. Then the left uterine ovarian pedicle was serially clamped cauterized and incised. Next the left round ligament was serially clamped cauterized and incised. The peritoneum that was on the fibroid was incised and the filmy tissue between the fibroid and bladder was dissected without difficulty.  The posterior peritoneum was opened without difficulty.  Attention was then turned back to the right side and the correct location of the plan between the bladder and KOH  ring was identified.  Posterior peritoneum was now opened on this side.  The bladder flap was begun on the right and then carried across to the dissection on the left side. The remainder of the bladder flap was created using sharp and blunt dissection. The bladder was well below the level of the KOH ring. Now the right uterine artery was clearly visible and then clamped, cauterized and incised to the level of the KOH ring.  Finally, the left uterine artery was skeletonized. Then the left uterine artery, above the level of the KOH ring, was serially clamped cauterized and incised. The uterus was devascularized at this point.  The colpotomy was performed a starting in the midline and using a harmonic scalpel with the inferior edge of the open blade  This was carried around a circumferential fashion until the vaginal mucosa was completely incised in the specimen was freed.  The specimen was then delivered to the vagina.  A vaginal occlusive device was used to maintain the pneumoperitoneum  Instruments were changed with a needle driver and Kobra graspers.  Using a 9 inch V. lock suture, the cuff was closed by incorporating the anterior and posterior vaginal mucosa  in each stitch. This was carried across all the way to the left corner and a running fashion. Two stitches were brought back towards the midline and the suture was cut flush with the vagina. The needle was brought out the pelvis. The pelvis was irrigated. All pedicles were inspected. No bleeding was noted. Pressures were decreased.  No bleeding was noted.  Then Arista was placed along the pelvis and pedicles.  At this point the hysterectomy was completed.  All instruments were removed.  The suprapubic port and umbilical ports were removed.  The patient was taken out of Trendelenburg positioning.  Several deep breaths were given to the patient's trying to any gas the abdomen and finally the RLQ and LLQ ports were removed.  The skin was then closed with  subcuticular stitches of 3-0 Vicryl. The skin was cleansed Dermabond was applied.   Attention was then turned the vagina and the cuff was inspected. No bleeding was noted. The anterior posterior vaginal mucosa was incorporated in each stitch. The Foley catheter was removed.  Cystoscopy was performed.  No sutures or bladder injuries were noted.  Ureters were noted with normal urine jets from each one was seen.  Entire bladder was visualized and there was no evidence of injury or stitch present.  Foley was left out after the cystoscopic fluid was drained and cystoscope removed.  Sponge, lap, needle, initially counts were correct x2. Patient tolerated the procedure very well. She was awakened from anesthesia, extubated and taken to recovery in stable condition.   COUNTS:  YES  PLAN OF CARE: Transfer to PACU

## 2017-07-02 ENCOUNTER — Other Ambulatory Visit: Payer: Self-pay | Admitting: Obstetrics & Gynecology

## 2017-07-02 ENCOUNTER — Encounter (HOSPITAL_BASED_OUTPATIENT_CLINIC_OR_DEPARTMENT_OTHER): Payer: Self-pay | Admitting: Obstetrics & Gynecology

## 2017-07-02 DIAGNOSIS — N800 Endometriosis of uterus: Secondary | ICD-10-CM | POA: Diagnosis not present

## 2017-07-02 LAB — CBC
HCT: 38 % (ref 36.0–46.0)
Hemoglobin: 12.9 g/dL (ref 12.0–15.0)
MCH: 29.5 pg (ref 26.0–34.0)
MCHC: 33.9 g/dL (ref 30.0–36.0)
MCV: 87 fL (ref 78.0–100.0)
PLATELETS: 263 10*3/uL (ref 150–400)
RBC: 4.37 MIL/uL (ref 3.87–5.11)
RDW: 14 % (ref 11.5–15.5)
WBC: 12 10*3/uL — AB (ref 4.0–10.5)

## 2017-07-02 LAB — BASIC METABOLIC PANEL
Anion gap: 6 (ref 5–15)
BUN: 13 mg/dL (ref 6–20)
CALCIUM: 8.4 mg/dL — AB (ref 8.9–10.3)
CO2: 20 mmol/L — ABNORMAL LOW (ref 22–32)
CREATININE: 0.69 mg/dL (ref 0.44–1.00)
Chloride: 112 mmol/L — ABNORMAL HIGH (ref 101–111)
GFR calc Af Amer: >60 mL/min (ref >60)
GLUCOSE: 112 mg/dL — AB (ref 65–99)
POTASSIUM: 4.2 mmol/L (ref 3.5–5.1)
SODIUM: 138 mmol/L (ref 135–145)

## 2017-07-02 MED ORDER — HYDROMORPHONE HCL 2 MG PO TABS
ORAL_TABLET | ORAL | Status: AC
  Filled 2017-07-02: qty 1

## 2017-07-02 MED ORDER — HYDROMORPHONE HCL 2 MG PO TABS: 2.0000 mg | ORAL_TABLET | ORAL | 0 refills | Status: AC | PRN

## 2017-07-02 MED ORDER — HYDROMORPHONE HCL 2 MG PO TABS: 2.0000 mg | ORAL_TABLET | ORAL | 0 refills | Status: DC | PRN

## 2017-07-02 MED ORDER — IBUPROFEN 800 MG PO TABS: 800.0000 mg | ORAL_TABLET | Freq: Three times a day (TID) | ORAL | 0 refills | Status: DC | PRN

## 2017-07-02 NOTE — Discharge Instructions (Addendum)
Post Op Hysterectomy Instructions Please read the instructions below. Refer to these instructions for the next few weeks. These instructions provide you with general information on caring for yourself after surgery. Your caregiver may also give you specific instructions. While your treatment has been planned according to the most current medical practices available, unavoidable problems sometimes happen. If you have any problems or questions after you leave, please call your caregiver.  HOME CARE INSTRUCTIONS Healing will take time. You will have discomfort, tenderness, swelling and bruising at the operative site for a couple of weeks. This is normal and will get better as time goes on.   Only take over-the-counter or prescription medicines for pain, discomfort or fever as directed by your caregiver.   Do not take aspirin. It can cause bleeding.   Do not drive when taking pain medication.   Follow your caregivers advice regarding diet, exercise, lifting, driving and general activities.   Resume your usual diet as directed and allowed.   Get plenty of rest and sleep.   Do not douche, use tampons, or have sexual intercourse until your caregiver gives you permission. .   Take your temperature if you feel hot or flushed.   You may shower today when you get home.  No tub bath for one week.    Do not drink alcohol until you are not taking any narcotic pain medications.   Try to have someone home with you for a week or two to help with the household activities.   Be careful over the next two to three weeks with any activities at home that involve lifting, pushing, or pulling.  Listen to your body--if something feels uncomfortable to do, then don't do it.  Make sure you and your family understands everything about your operation and recovery.   Walking up stairs is fine.  Do not sign any legal documents until you feel normal again.   Keep all your follow-up appointments as recommended by  your caregiver.   PLEASE CALL THE OFFICE IF:  There is swelling, redness or increasing pain in the wound area.   Pus is coming from the wound.   You notice a bad smell from the wound or surgical dressing.   You have pain, redness and swelling from the intravenous site.   The wound is breaking open (the edges are not staying together).    You develop pain or bleeding when you urinate.   You develop abnormal vaginal discharge.   You have any type of abnormal reaction or develop an allergy to your medication.   You need stronger pain medication for your pain   SEEK IMMEDIATE MEDICAL CARE:  You develop a temperature of 100.5 or higher.   You develop abdominal pain.   You develop chest pain.   You develop shortness of breath.   You pass out.   You develop pain, swelling or redness of your leg.   You develop heavy vaginal bleeding with or without blood clots.   MEDICATIONS:  Restart your regular medications BUT wait one week before restarting all vitamins and mineral supplements  Use Motrin 800mg  every 8 hours for the next several days.  You can alternate taking this with extra strength tylenol (1gm) every six hours as well.  This will help you use less Dilaudid.  Use the Dilaudid 2mg  every 4 hours as needed for pain.  You may use an over the counter stool softener like Colace or Dulcolax to help with starting a bowel movement.  Start the day after you go home.  Warm liquids, fluids, and ambulation help too.  If you have not had a bowel movement in four days, you need to call the office.

## 2017-07-02 NOTE — Progress Notes (Signed)
1 Day Post-Op Procedure(s) (LRB): HYSTERECTOMY TOTAL LAPAROSCOPIC (N/A) LAPAROSCOPIC BILATERAL SALPINGECTOMY (Bilateral) CYSTOSCOPY (N/A)  Subjective: Patient reports she is hurting this morning but hasn't taken any pain medication since before midnight.  Denies VB.  No nausea.  Eating, walking, voiding without difficulty.   Objective: I have reviewed patient's vital signs, intake and output, medications and labs. UOP:  >1200 cc post op  General: alert and cooperative Resp: clear to auscultation bilaterally Cardio: regular rate and rhythm, S1, S2 normal, no murmur, click, rub or gallop GI: soft, non-tender; bowel sounds normal; no masses,  no organomegaly and incision: clean, dry and intact Extremities: extremities normal, atraumatic, no cyanosis or edema Vaginal Bleeding: none  Assessment: s/p Procedure(s) with comments: HYSTERECTOMY TOTAL LAPAROSCOPIC (N/A) LAPAROSCOPIC BILATERAL SALPINGECTOMY (Bilateral) CYSTOSCOPY (N/A) - possible cysto: stable and progressing well  Plan: Discharge home  LOS: 0 days    Megan Salon 07/02/2017, 9:45 AM

## 2017-07-04 ENCOUNTER — Telehealth: Payer: Self-pay | Admitting: Obstetrics & Gynecology

## 2017-07-04 NOTE — Telephone Encounter (Signed)
Spoke with patient and she has only been taking Dilaudid every 4 hours, no Ibuprofen. Advised patient to take Ibuprofen 800mg  every 8 hours and take Dilaudid every 4 hours as needed. Hopefully she will be able to come off Dilaudid soon. Also discussed narcotics and constipation. She states hasn't had great BMs but has had some movement since surgery. She is taking Dulcolax as suggested by Dr.Miller. I advised to really increase her water intake and take small walks through house as she could tolerate. Her mom was with patient and she will help her with this. Patient to call back with any further questions/problems.

## 2017-07-04 NOTE — Telephone Encounter (Signed)
Patient had hysterectomy 07/01/17. Was wondering about taking tylenol in between prescribed medication. States she could not find the information in her paperwork.

## 2017-07-05 ENCOUNTER — Encounter: Payer: Self-pay | Admitting: Obstetrics & Gynecology

## 2017-07-05 ENCOUNTER — Ambulatory Visit (INDEPENDENT_AMBULATORY_CARE_PROVIDER_SITE_OTHER): Payer: BC Managed Care – PPO | Admitting: Obstetrics & Gynecology

## 2017-07-05 VITALS — BP 122/72 | HR 76 | Resp 18 | Ht 64.0 in | Wt 261.0 lb

## 2017-07-05 DIAGNOSIS — Z9889 Other specified postprocedural states: Secondary | ICD-10-CM

## 2017-07-05 NOTE — Discharge Summary (Signed)
Physician Discharge Summary  Patient ID: [name] MRN: [id number] DOB/AGE: 1975/05/20 42 y.o.  Admit date: 07/01/2017 Discharge date: 07/05/2017  Admission Diagnoses: menorrhagia, h/o iron deficiency anemia, h/o iron transfusions, pelvic pain, fibroid  Discharge Diagnoses:  Endometriosis  Discharged Condition: good  Hospital Course: Patient admitted through same day surgery.  She was taken to OR where TLH/Bilateral salpingectomy/cystoscopy were performed.  Surgical findings included endometriosis and fibroids.  Surgery was uneventful however she did experience what appear to be reactive airway event with intubation.  Pt was monitored carefully and treated with albuterol inhaler and epinephrine.  She was stable before procedure was actually started.  EBL 100cc.  Foley catheter was removed before leaving OR.  Patient transferred to PACU where she was stable and then to post-op observation unit for the remainder of her hospitalization.  During her post-op recovery, her vitals and stable and she was AF.  In evening of POD#0, she was able to transition to oral pain medications and regular diet.  She was able to ambulate and she had good pain control.  She was also able to void on her own.  Patient seen both in the evening of POD#0 and AM of POD#1.  In the AM of POD#1, she was without complaint.  Post op hb was 12.9, decreased from 14.5, pre-operatively.  At this point, patient was voiding, walking, having excellent pain control, had no nausea, and minimal vaginal bleeding.  She was ready for D/C.  Consults: Respiratory therapy  Significant Diagnostic Studies: labs: post op hb 12.9   Treatments: surgery: TLH/bilateral salpingectomy/cystoscopy  Discharge Exam: Blood pressure (!) 120/59, pulse 68, temperature 97.8 F (36.6 C), resp. rate 18, height 5\' 4"  (1.626 m), weight 258 lb 9.6 oz (117.3 kg), last menstrual period 06/29/2017, SpO2 98 %. General appearance: alert, cooperative and no  distress Resp: clear to auscultation bilaterally Cardio: regular rate and rhythm, S1, S2 normal, no murmur, click, rub or gallop GI: soft, non-tender; bowel sounds normal; no masses,  no organomegaly Extremities: extremities normal, atraumatic, no cyanosis or edema Incision/Wound:C/D/I  Disposition:  D/C home   Allergies as of 07/02/2017      Reactions   Gluten Meal    Hydrocodone    Unsure if true allergy      Medication List    TAKE these medications   ALPRAZolam 0.5 MG tablet Commonly known as:  XANAX Take 1 tablet (0.5 mg total) by mouth at bedtime as needed for anxiety.   Diclofenac Potassium 50 MG Pack Commonly known as:  CAMBIA Take 50 mg daily as needed by mouth.   flurbiprofen 100 MG tablet Commonly known as:  ANSAID Take 100 mg by mouth 2 (two) times daily as needed.   fluticasone 50 MCG/ACT nasal spray Commonly known as:  FLONASE Place 1 spray into both nostrils as needed.   hydrOXYzine 10 MG tablet Commonly known as:  ATARAX/VISTARIL Take 10 mg by mouth as needed. Take 3 tablets twice a day   Lorcaserin HCl 10 MG Tabs Take 10 mg 2 (two) times daily by mouth.   MELATONIN PO Take 2.5 mg by mouth.   ondansetron 4 MG disintegrating tablet Commonly known as:  ZOFRAN ODT Take 1 tablet (4 mg total) every 8 (eight) hours as needed by mouth.   Pediatric Mouthpiece Misc Mouth guard/oral device for OSA.   sertraline 50 MG tablet Commonly known as:  ZOLOFT Take 50 mg by mouth daily.   tiZANidine 4 MG tablet Commonly known as:  ZANAFLEX  Take 1 tablet (4 mg total) every 6 (six) hours as needed by mouth for muscle spasms.   zonisamide 100 MG capsule Commonly known as:  ZONEGRAN Take 1 capsule (100 mg total) 2 (two) times daily by mouth. What changed:  when to take this     ASK your doctor about these medications   HYDROmorphone 2 MG tablet Commonly known as:  DILAUDID Take 1 tablet (2 mg total) by mouth every 4 (four) hours as needed for up to 5  days.   ibuprofen 800 MG tablet Commonly known as:  ADVIL,MOTRIN Take 1 tablet (800 mg total) by mouth every 8 (eight) hours as needed.        Signed: Megan Salon 07/05/2017, 3:22 AM

## 2017-07-05 NOTE — Progress Notes (Signed)
Post Operative Visit  Procedure: Hysterectomy total laparoscopic, Bilateral Salpingectomy, Cystoscopy. Days Post-op: 4  Subjective: Pt has concerns about an incision.  No drainage or bleeding.  Moved and felt a pop and she wanted to make sure this is ok.  No vaginal bleeding.  No fevers.  Has had a BM.  Reviewed colace with pt.  Pain management reviewed as well.   Objective: BP 122/72 (BP Location: Right Arm, Patient Position: Sitting, Cuff Size: Large)   Pulse 76   Resp 18   Ht 5\' 4"  (1.626 m)   Wt 261 lb (118.4 kg)   LMP 06/29/2017 (Exact Date)   BMI 44.80 kg/m   EXAM General: alert, cooperative and no distress GI: soft, non-tender; bowel sounds normal; no masses,  no organomegaly and incision: clean, dry and intact Extremities: extremities normal, atraumatic, no cyanosis or edema Vaginal Bleeding: none  Assessment: s/p TLH/bilateral salpingectomy/cystoscopy  Plan: Recheck 4  Days.  Pt reassured as incisions look good.

## 2017-07-05 NOTE — Patient Instructions (Signed)
Motrin 800mg  every 8 hours Tylenol 1gm (two extra strenth Tylenol) every 6 hours Dilaudid 2mg  every 4 hours -- you do want to try and stretch this out as much as you can

## 2017-07-08 ENCOUNTER — Ambulatory Visit: Payer: BC Managed Care – PPO | Admitting: Obstetrics & Gynecology

## 2017-07-09 ENCOUNTER — Encounter: Payer: Self-pay | Admitting: Obstetrics & Gynecology

## 2017-07-09 ENCOUNTER — Other Ambulatory Visit: Payer: Self-pay

## 2017-07-09 ENCOUNTER — Ambulatory Visit (INDEPENDENT_AMBULATORY_CARE_PROVIDER_SITE_OTHER): Payer: BC Managed Care – PPO | Admitting: Obstetrics & Gynecology

## 2017-07-09 VITALS — BP 110/62 | HR 72 | Resp 14 | Ht 64.0 in | Wt 259.0 lb

## 2017-07-09 DIAGNOSIS — J9801 Acute bronchospasm: Secondary | ICD-10-CM

## 2017-07-09 DIAGNOSIS — Z9889 Other specified postprocedural states: Secondary | ICD-10-CM

## 2017-07-09 MED ORDER — HYDROMORPHONE HCL 2 MG PO TABS: 2.0000 mg | ORAL_TABLET | ORAL | 0 refills | Status: DC | PRN

## 2017-07-09 MED ORDER — ALPRAZOLAM 0.5 MG PO TABS: 0.5000 mg | ORAL_TABLET | Freq: Every evening | ORAL | 0 refills | Status: DC | PRN

## 2017-07-09 NOTE — Progress Notes (Addendum)
Post Operative Visit  Procedure: Hysterectomy Total Laparoscopic, Laparoscopic Bilateral Salpingectomy, cystoscopy.  Days Post-op: 8  Subjective: Doing well.  Still having some urinary urgency.  Not having any dysuria.  Denies vaginal bleeding.  Having more regular bowel movements.   Pt aware she had a severe bronchospasm at the time of intubation.  She needed ephinephrine for stabilization.  I was, of course, able to complete the surgery.  Dr. Candida Peeling was the anesthesiologist for the procedure.  Pulmonology referral was recommended.  Will plan this once she has gotten a little further with her recovery.  Denies any breathing issues.  Documentation is under the intraoperative portion of the anesthesia record.  Objective: BP 110/62   Pulse 72   Resp 14   Ht 5\' 4"  (1.626 m)   Wt 259 lb (117.5 kg)   LMP 06/29/2017 (Exact Date)   BMI 44.46 kg/m   EXAM General: alert and no distress Resp: clear to auscultation bilaterally Cardio: regular rate and rhythm, S1, S2 normal, no murmur, click, rub or gallop GI: soft, non-tender; bowel sounds normal; no masses,  no organomegaly and incision: clean, dry and intact Extremities: extremities normal, atraumatic, no cyanosis or edema Vaginal Bleeding: none  Assessment: s/p TLH/bilateral salpingectomy/cystoscopy Severe bronchospasm at time of intubation.    Plan: Recheck 3 weeks RF for Dilaudid 2mg  every 8 hours.  #15/0RF RF for Xanax 0.5mg  every 8 hours as needed.  #30/0RF.

## 2017-07-12 ENCOUNTER — Encounter: Payer: Self-pay | Admitting: Obstetrics & Gynecology

## 2017-07-19 ENCOUNTER — Ambulatory Visit (INDEPENDENT_AMBULATORY_CARE_PROVIDER_SITE_OTHER): Payer: BC Managed Care – PPO | Admitting: Obstetrics and Gynecology

## 2017-07-19 ENCOUNTER — Telehealth: Payer: Self-pay | Admitting: Obstetrics & Gynecology

## 2017-07-19 VITALS — BP 120/68 | HR 64 | Resp 14 | Ht 64.0 in | Wt 260.0 lb

## 2017-07-19 DIAGNOSIS — K5903 Drug induced constipation: Secondary | ICD-10-CM | POA: Diagnosis not present

## 2017-07-19 DIAGNOSIS — L7682 Other postprocedural complications of skin and subcutaneous tissue: Secondary | ICD-10-CM

## 2017-07-19 NOTE — Telephone Encounter (Signed)
Call to patient. States she has had constant discomfort in right abdominal incision for 4 days.  Unresponsive to Motrin or Dilaudid.  Denies fever, redness or drainage. Feels like a pressure. 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 Has been to zoo earlier this week. Discussed likely doing too much activity.  Offered office visit for assessment before weekend which patient prefers.  Come in now for work-in.  Routing to provider for final review. Will close encounter.    [facility] with Dr Quincy Simmonds as Dr Sabra Heck out of office.

## 2017-07-19 NOTE — Telephone Encounter (Signed)
Patient left message on answering machine over lunch that she is having pain at her incision site.
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
## 2017-07-22 ENCOUNTER — Encounter: Payer: Self-pay | Admitting: Obstetrics and Gynecology

## 2017-07-25 ENCOUNTER — Ambulatory Visit: Payer: BC Managed Care – PPO | Admitting: Certified Nurse Midwife

## 2017-07-25 ENCOUNTER — Encounter: Payer: Self-pay | Admitting: Obstetrics & Gynecology

## 2017-07-25 ENCOUNTER — Telehealth: Payer: Self-pay | Admitting: *Deleted

## 2017-07-25 NOTE — Telephone Encounter (Signed)
See phone encounter.

## 2017-07-25 NOTE — Telephone Encounter (Signed)
Call to patient regarding My Chart message. Left message to call back to triage nurse.

## 2017-07-25 NOTE — Telephone Encounter (Signed)
Patient returning Sally's call. I informed her that Gay Filler will return her call once she has an answer to her My Chart message.

## 2017-07-25 NOTE — Telephone Encounter (Signed)
Reviewed with Dr Sabra Heck. Advised pool activity not recommended.   Patient notified. Encounter closed.

## 2017-07-25 NOTE — Telephone Encounter (Signed)
Patient 3 1/2 weeks post TLH.  Office visit last week for incision pain- encouraged to decrease activity.  Reports pain is improved. Feeling tired from concert Tuesday night. Would like to be in pool today with friends/kids for 2 hours. Has walk in stairs.

## 2017-07-25 NOTE — Telephone Encounter (Signed)
My Chart message from patient:  ----- Message from [place], Generic sent at 07/25/2017 9:21 AM EDT -----    [name]! Am I allowed to get in a pool yet? I just want to make sure before I do it!

## 2017-07-29 ENCOUNTER — Encounter: Payer: Self-pay | Admitting: Neurology

## 2017-07-29 ENCOUNTER — Ambulatory Visit: Payer: BC Managed Care – PPO | Admitting: Neurology

## 2017-07-29 DIAGNOSIS — I639 Cerebral infarction, unspecified: Secondary | ICD-10-CM | POA: Diagnosis not present

## 2017-07-29 DIAGNOSIS — G43109 Migraine with aura, not intractable, without status migrainosus: Secondary | ICD-10-CM | POA: Diagnosis not present

## 2017-07-29 DIAGNOSIS — F419 Anxiety disorder, unspecified: Secondary | ICD-10-CM

## 2017-07-29 DIAGNOSIS — G4733 Obstructive sleep apnea (adult) (pediatric): Secondary | ICD-10-CM

## 2017-07-29 DIAGNOSIS — Z6837 Body mass index (BMI) 37.0-37.9, adult: Secondary | ICD-10-CM

## 2017-07-29 MED ORDER — BUPROPION HCL ER (SR) 150 MG PO TB12: 150.0000 mg | ORAL_TABLET | Freq: Two times a day (BID) | ORAL | 11 refills | Status: DC

## 2017-07-29 MED ORDER — FLUOXETINE HCL 20 MG PO CAPS: 20.0000 mg | ORAL_CAPSULE | Freq: Every day | ORAL | 6 refills | Status: DC

## 2017-07-29 NOTE — Patient Instructions (Signed)
-   Continue preventive medication to zonogram 200 at night, also suggested magnesium oxide 400 mg twice a day, riboflavin 100 mg twice a day.   - Add prozac and wellbutrin may also help with obesity and migraines and anxiety  - Cambia as needed, with zofran 49m, tizanidine 442mas needed for acute management  Evidence of small right cerebellum stroke: Needs to treat cpap, ASA 815maily  Needs follow up with cpap, encouraged. Increase rick of stroke, obesity, HTN - referred to our sleep center. She had a sleep study done May 11. The sleep study shows sleep apnea. She had a monitoring time of 5 hour 41 minutes. With an AHI 14.1. Never had a cpap titration, Dr. DohBrett Fairyrees to see.   I had a long d/w patient about her previous stroke, risk for recurrent stroke/TIAs, personally independently reviewed imaging studies and stroke evaluation results and answered questions.Continue ASA for secondary stroke prevention and maintain strict control of hypertension with blood pressure goal below 130/90, diabetes with hemoglobin A1c goal below 6.5% and lipids with LDL cholesterol goal below 70 mg/dL. I also advised the patient to eat a healthy diet with plenty of whole grains, cereals, fruits and vegetables, exercise regularly and maintain ideal body weight .  Discussed:  There is increased risk for stroke in women with migraine with aura and a  Contraindication for the combined contraceptive pill for use by women who have migraine with aura, which is in line with World Health Organisation recommendations. The risk for women with migraine without aura is lower and other risk factors like smoking are far more likely to increase stroke risk than migraine. There is a recommendation for no smoking and for the use of low estrogen or progestogen only pills particularly for women with migraine with aura. It is important however that women with migraine who are taking the pill do not dec
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
## 2017-07-30 ENCOUNTER — Encounter: Payer: Self-pay | Admitting: Obstetrics & Gynecology

## 2017-07-30 ENCOUNTER — Encounter: Payer: Self-pay | Admitting: Neurology

## 2017-07-30 ENCOUNTER — Other Ambulatory Visit: Payer: Self-pay | Admitting: Neurology

## 2017-07-30 ENCOUNTER — Telehealth: Payer: Self-pay | Admitting: Obstetrics & Gynecology

## 2017-07-30 LAB — LIPID PANEL
CHOL/HDL RATIO: 3.9 ratio (ref 0.0–4.4)
Cholesterol, Total: 166 mg/dL (ref 100–199)
HDL: 43 mg/dL (ref >39)
LDL Calculated: 92 mg/dL (ref 0–99)
TRIGLYCERIDES: 156 mg/dL — AB (ref 0–149)
VLDL CHOLESTEROL CAL: 31 mg/dL (ref 5–40)

## 2017-07-30 LAB — HEMOGLOBIN A1C
Est. average glucose Bld gHb Est-mCnc: 97 mg/dL
HEMOGLOBIN A1C: 5 % (ref 4.8–5.6)

## 2017-07-30 MED ORDER — ATORVASTATIN CALCIUM 10 MG PO TABS: 10.0000 mg | ORAL_TABLET | Freq: Every day | ORAL | 4 refills | Status: DC

## 2017-07-30 NOTE — Telephone Encounter (Signed)
Patient called and left a message during lunch. She asked whether her results from her recent surgery are back yet.

## 2017-07-30 NOTE — Telephone Encounter (Signed)
She also sent a mychart message and I answered via Mychart.  I felt there was endometriosis present at the time of surgery.  Pathology has done additional specimen sampling but continues to only see adenomyosis.  Pt was informed of this result via mychart.  Ok to close encounter.

## 2017-08-13 ENCOUNTER — Ambulatory Visit (INDEPENDENT_AMBULATORY_CARE_PROVIDER_SITE_OTHER): Payer: BC Managed Care – PPO | Admitting: Obstetrics & Gynecology

## 2017-08-13 ENCOUNTER — Encounter: Payer: Self-pay | Admitting: Obstetrics & Gynecology

## 2017-08-13 ENCOUNTER — Encounter: Payer: Self-pay | Admitting: Neurology

## 2017-08-13 VITALS — BP 110/80 | HR 84 | Resp 16 | Ht 64.0 in | Wt 254.0 lb

## 2017-08-13 DIAGNOSIS — J45909 Unspecified asthma, uncomplicated: Secondary | ICD-10-CM

## 2017-08-13 DIAGNOSIS — Z9889 Other specified postprocedural states: Secondary | ICD-10-CM

## 2017-08-13 NOTE — Progress Notes (Addendum)
Post Operative Visit  Procedure: Hysterectomy Total Laparoscopic, Laparoscopic Bilateral Salpingectomy, Cystoscopy.  Days Post-op: 43  Subjective: Doing well.  Reports she feels better than she thought she would but does still feel like she needs to nap some.  Off pain medication.  Daughter jumped on her abdomen yesterday.  Is feeling sore today.    Pt had a severe bronchospam reaction at the time of intubation requiring epinephrine to proceed.  This was previously communicated to pt and now referral to pulmonology is made as recommended by anesthesia.    Dr. Candida Peeling was the anesthesiologist for the surgery.  This event is documented in the intraoperative portion of the anesthesia record for review with referral.    Objective: BP 110/80 (BP Location: Right Arm, Patient Position: Sitting, Cuff Size: Large)   Pulse 84   Resp 16   Ht 5\' 4"  (1.626 m)   Wt 254 lb (115.2 kg)   LMP 06/29/2017 (Exact Date)   BMI 43.60 kg/m   EXAM General: alert and no distress Resp: clear to auscultation bilaterally Cardio: regular rate and rhythm, S1, S2 normal, no murmur, click, rub or gallop GI: soft, non-tender; bowel sounds normal; no masses,  no organomegaly and incision: clean, dry and intact Extremities: extremities normal, atraumatic, no cyanosis or edema Vaginal Bleeding: none  Gyn:    Assessment: s/p TLH/bilateral salpingectomy/cystoscopy 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 Pulmonology referral made today.

## 2017-08-13 NOTE — Addendum Note (Signed)
Addended by: Megan Salon on: 08/13/2017 03:40 PM   Modules accepted: Orders

## 2017-08-23 ENCOUNTER — Other Ambulatory Visit: Payer: Self-pay

## 2017-08-23 ENCOUNTER — Encounter: Payer: Self-pay | Admitting: Obstetrics & Gynecology

## 2017-08-23 DIAGNOSIS — D509 Iron deficiency anemia, unspecified: Secondary | ICD-10-CM

## 2017-08-23 DIAGNOSIS — J9801 Acute bronchospasm: Secondary | ICD-10-CM | POA: Insufficient documentation
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
## 2017-08-26 ENCOUNTER — Inpatient Hospital Stay: Payer: BC Managed Care – PPO | Attending: Hematology | Admitting: Hematology

## 2017-08-26 ENCOUNTER — Inpatient Hospital Stay: Payer: BC Managed Care – PPO

## 2017-08-26 ENCOUNTER — Telehealth: Payer: Self-pay | Admitting: Hematology

## 2017-08-26 VITALS — BP 115/72 | HR 86 | Temp 98.5°F | Resp 18 | Ht 64.0 in | Wt 254.6 lb

## 2017-08-26 DIAGNOSIS — Z8673 Personal history of transient ischemic attack (TIA), and cerebral infarction without residual deficits: Secondary | ICD-10-CM

## 2017-08-26 DIAGNOSIS — D509 Iron deficiency anemia, unspecified: Secondary | ICD-10-CM | POA: Insufficient documentation

## 2017-08-26 DIAGNOSIS — K900 Celiac disease: Secondary | ICD-10-CM | POA: Diagnosis not present

## 2017-08-26 DIAGNOSIS — E538 Deficiency of other specified B group vitamins: Secondary | ICD-10-CM

## 2017-08-26 LAB — CMP (CANCER CENTER ONLY)
ALT: 19 U/L (ref 0–44)
ANION GAP: 8 (ref 5–15)
AST: 14 U/L — ABNORMAL LOW (ref 15–41)
Albumin: 3.9 g/dL (ref 3.5–5.0)
Alkaline Phosphatase: 82 U/L (ref 38–126)
BUN: 15 mg/dL (ref 6–20)
CALCIUM: 9.2 mg/dL (ref 8.9–10.3)
CHLORIDE: 109 mmol/L (ref 98–111)
CO2: 19 mmol/L — AB (ref 22–32)
Creatinine: 0.74 mg/dL (ref 0.44–1.00)
GFR, Estimated: >60 mL/min (ref >60)
Glucose, Bld: 96 mg/dL (ref 70–99)
Potassium: 3.8 mmol/L (ref 3.5–5.1)
SODIUM: 136 mmol/L (ref 135–145)
Total Bilirubin: 0.5 mg/dL (ref 0.3–1.2)
Total Protein: 6.8 g/dL (ref 6.5–8.1)

## 2017-08-26 LAB — IRON AND TIBC
Iron: 60 ug/dL (ref 41–142)
SATURATION RATIOS: 20 % — AB (ref 21–57)
TIBC: 298 ug/dL (ref 236–444)
UIBC: 238 ug/dL

## 2017-08-26 LAB — CBC WITH DIFFERENTIAL (CANCER CENTER ONLY)
BASOS PCT: 0 %
Basophils Absolute: 0 10*3/uL (ref 0.0–0.1)
Eosinophils Absolute: 0.1 10*3/uL (ref 0.0–0.5)
Eosinophils Relative: 2 %
HEMATOCRIT: 43.7 % (ref 34.8–46.6)
HEMOGLOBIN: 14.9 g/dL (ref 11.6–15.9)
Lymphocytes Relative: 19 %
Lymphs Abs: 1.3 10*3/uL (ref 0.9–3.3)
MCH: 29.4 pg (ref 25.1–34.0)
MCHC: 34.1 g/dL (ref 31.5–36.0)
MCV: 86.4 fL (ref 79.5–101.0)
MONOS PCT: 4 %
Monocytes Absolute: 0.3 10*3/uL (ref 0.1–0.9)
NEUTROS ABS: 5.2 10*3/uL (ref 1.5–6.5)
NEUTROS PCT: 75 %
Platelet Count: 254 10*3/uL (ref 145–400)
RBC: 5.06 MIL/uL (ref 3.70–5.45)
RDW: 13.2 % (ref 11.2–14.5)
WBC Count: 6.9 10*3/uL (ref 3.9–10.3)

## 2017-08-26 LAB — RETICULOCYTES
RBC.: 5.06 MIL/uL (ref 3.70–5.45)
RETIC COUNT ABSOLUTE: 65.8 10*3/uL (ref 33.7–90.7)
Retic Ct Pct: 1.3 % (ref 0.7–2.1)

## 2017-08-26 LAB — FERRITIN: FERRITIN: 121 ng/mL (ref 11–307)

## 2017-08-26 NOTE — Telephone Encounter (Signed)
Scheduled apt per 8/12 los - gave patient AVS and calender per los.- per patient no print out wanted.

## 2017-09-23 ENCOUNTER — Ambulatory Visit: Payer: BC Managed Care – PPO | Admitting: Neurology

## 2017-09-23 ENCOUNTER — Encounter: Payer: Self-pay | Admitting: Neurology

## 2017-09-23 DIAGNOSIS — G43109 Migraine with aura, not intractable, without status migrainosus: Secondary | ICD-10-CM | POA: Diagnosis not present

## 2017-09-23 DIAGNOSIS — I639 Cerebral infarction, unspecified: Secondary | ICD-10-CM

## 2017-09-23 DIAGNOSIS — G43709 Chronic migraine without aura, not intractable, without status migrainosus: Secondary | ICD-10-CM

## 2017-09-23 DIAGNOSIS — G4733 Obstructive sleep apnea (adult) (pediatric): Secondary | ICD-10-CM | POA: Diagnosis not present
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
## 2017-09-27 ENCOUNTER — Ambulatory Visit (INDEPENDENT_AMBULATORY_CARE_PROVIDER_SITE_OTHER): Payer: BC Managed Care – PPO | Admitting: Neurology

## 2017-09-27 DIAGNOSIS — G43109 Migraine with aura, not intractable, without status migrainosus: Secondary | ICD-10-CM

## 2017-09-27 DIAGNOSIS — G4733 Obstructive sleep apnea (adult) (pediatric): Secondary | ICD-10-CM

## 2017-09-27 DIAGNOSIS — Z6841 Body Mass Index (BMI) 40.0 and over, adult: Principal | ICD-10-CM

## 2017-09-27 DIAGNOSIS — G43709 Chronic migraine without aura, not intractable, without status migrainosus: Secondary | ICD-10-CM

## 2017-09-27 DIAGNOSIS — I639 Cerebral infarction, unspecified: Secondary | ICD-10-CM

## 2017-10-03 ENCOUNTER — Encounter (INDEPENDENT_AMBULATORY_CARE_PROVIDER_SITE_OTHER): Payer: BC Managed Care – PPO

## 2017-10-06 NOTE — Procedures (Signed)
PATIENT'S NAME:  [name], [name] DOB:      02/11/1975      MR#:    [id number]     DATE OF RECORDING: 09/27/2017 REFERRING M.D.:  Sarina Ill, M.D. Study Performed:  CPAP Titration Study with one hour baseline observation.  HISTORY:  [name] is a 42 year old female patient with Migraines, morbid Obesity, iron deficiency anemia, GERD and untreated sleep apnea who would like to discuss OSA treatment options.  Her last sleep study was an attended overnight PSG study at [facility] on 25 May 2016. Total sleep efficiency was very high at 92% of the recorded night.  Sleep architecture showed 115 arousals with respiratory events, none from periodic limb movements, 35 were spontaneous.  The total AHI was 14.1/h and was strongly REM accentuated. In non-REM sleep her AHI was very low at 1.8/h. she found that a dental device was not insurance covered, struggles with claustrophobia and therefor with CPAP therapy. She reports RLS symptoms. The patient endorsed the Epworth Sleepiness Scale at 2/ 24 points, the FSS at 59/63 points - very high!   The patient's weight 258 pounds with a height of 64 (inches), resulting in a BMI of 44.29 kg/m2. The patient's neck circumference measured 18 inches.  CURRENT MEDICATIONS: Xanax, Lipitor, Wellbutrin, Cambia, Prozac, Flonase, Vistaril, Melatonin, Zofran  PROCEDURE:  This is a multichannel digital polysomnogram utilizing the Somnostar 11.2 system.  Electrodes and sensors were applied and monitored per AASM Specifications.   EEG, EOG, Chin and Limb EMG, were sampled at 200 Hz.  ECG, Snore and Nasal Pressure, Thermal Airflow, Respiratory Effort, CPAP Flow and Pressure, Oximetry was sampled at 50 Hz. Digital video and audio were recorded.      BASELINE STUDY WITHOUT CPAP RESULTS: Lights Out was at 21:12 and Lights On at 05:13.  Total recording time (TRT) was 80 minutes, with a total sleep time (TST) of 68 minutes. The patient's sleep latency was 5  minutes.  REM latency was 0 minutes. The sleep efficiency was 85.0 %.    SLEEP ARCHITECTURE: WASO (Wake after sleep onset) was 2 minutes, Stage N1 was 2.5 minutes, Stage N2 was 50 minutes, Stage N3 was 15.5 minutes and Stage R (REM sleep) was 0 minutes.  The percentages were Stage N1 3.7%, Stage N2 73.5%, Stage N3 22.8% and Stage R (REM sleep) 0%.   RESPIRATORY ANALYSIS:  There were a total of 1 respiratory event:  0 apneas and 1 hypopnea. Snoring was noted.   The total APNEA/HYPOPNEA INDEX (AHI) was 0.9 /hour.  0 events occurred in REM sleep and 2 events in NREM. The REM AHI was 0, /hour versus a non-REM AHI of 0.9 /hour. The patient spent 405 minutes sleep time in the supine position 40 minutes in non-supine. The supine AHI was 0.9 /hour versus a non-supine AHI of 0.0 /hour. There was mild to moderate snoring. OXYGEN SATURATION & C02:  The wake baseline 02 saturation was 97%, with the lowest being 92%. Time spent below 89% saturation equaled 0 minutes. Overall, the average End Tidal CO2 during sleep was 31.5 torr.  Peak End Tidal CO2 during NREM sleep was  34 torr. PERIODIC LIMB MOVEMENTS: The patient had a total of 0 Periodic Limb Movements The arousals were noted as: 16 were spontaneous, 0 were associated with PLMs, and only 1 was associated with respiratory events.   Audio and video analysis did not show any abnormal or unusual movements, behaviors, phonations or vocalizations. Snoring  was noted. EKG was in keeping with normal sinus rhythm (NSR)  TITRATION STUDY WITH CPAP RESULTS: CPAP was initiated at 5 cmH20 with heated humidity per AASM split night standards and pressure was advanced to 7 cmH20 because of hypopneas, apneas and desaturations.  At a PAP pressure of 6 cmH20, there was a reduction of the AHI to 0.9 /hour. Total recording time (TRT) was 401.5 minutes, with a total sleep time (TST) of 376.5 minutes. The patient's sleep latency was 5 minutes. REM latency was 64.5 minutes.  The sleep  efficiency was 93.8 %.    SLEEP ARCHITECTURE: Wake after sleep was 20.5 minutes, Stage N1 18 minutes, Stage N2 243 minutes, Stage N3 27.5 minutes and Stage R (REM sleep) 88 minutes. The percentages were: Stage N1 4.8%, Stage N2 64.5%, Stage N3 7.3% and Stage R (REM sleep) 23.4%.  RESPIRATORY ANALYSIS:  There were a total of 2 respiratory events: 2 hypopneas.     The total APNEA/HYPOPNEA INDEX (AHI) was 0.3 /hour and the total RESPIRATORY DISTURBANCE INDEX was 0.3 /hour.  2 events occurred in REM sleep and 0 events in NREM. The REM AHI was 1.4 /hour versus a non-REM AHI of 0.0 /hour. The patient spent 90% of total sleep time in the supine position. The supine AHI was 0.4 /hour, versus a non-supine AHI of 0.0/hour.  OXYGEN SATURATION & C02:  The wake baseline 02 saturation was 98%, with the lowest being 88%. Time spent below 89% saturation equaled 0.1 minutes. PERIODIC LIMB MOVEMENTS:   The patient had a total of 0 Periodic Limb Movements. The arousals were noted as: 72 were spontaneous, 0 were associated with PLMs, and only 1 associated with respiratory events.   POLYSOMNOGRAPHY IMPRESSION :   1. REM dependent Obstructive Sleep Apnea (OSA) as diagnosed in 05-2016 at [facility] was sufficiently treated with CPAP at only 7 cm water.   2. Primary Snoring was alleviated under CPAP. 3. No evidence of Periodic Limb Movement Disorder (PLMD) 4. Post-study, the patient indicated that sleep was longer, deeper than usual.  RECOMMENDATIONS: Medical weight management is recommended. I wrote an Order for treatment with an auto titration capable CPAP from a pressure of 4-8 cm water with 2 cm EPR, and use of an Eson small nasal mask. Follow up for compliance will be scheduled.   A follow up appointment will be scheduled in the Sleep Clinic at [facility].    I certify that I have reviewed the entire raw data recording prior to the issuance of this report in accordance with the Standards of  Accreditation of the American Academy of Sleep Medicine (AASM)  Larey Seat, M.D.  10-06-2017  Diplomat, American Board of Psychiatry and Neurology  Diplomat, [place] of Sleep Medicine Medical Director, [place] [facility]

## 2017-10-06 NOTE — Addendum Note (Signed)
Addended by: Larey Seat on: 10/06/2017 03:59 PM   Modules accepted: Orders

## 2017-10-07 ENCOUNTER — Telehealth: Payer: Self-pay | Admitting: Neurology

## 2017-10-07 NOTE — Telephone Encounter (Signed)
I called pt. I advised pt that Dr. Brett Fairy reviewed their sleep study results and found that pt had sleep apnea. Dr. Brett Fairy recommends that pt starts auto CPAP 4-8 cm water pressure. I reviewed PAP compliance expectations with the pt. Pt is agreeable to starting a CPAP. I advised pt that an order will be sent to a DME, [facility], and [facility] will call the pt within about one week after they file with the pt's insurance. [facility] will show the pt how to use the machine, fit for masks, and troubleshoot the CPAP if needed. A follow up appt was made for insurance purposes with Ward Givens NP  on Jan 2,2019 at 3:00 pm. Pt verbalized understanding to arrive 15 minutes early and bring their CPAP. A letter with all of this information in it will be mailed to the pt as a reminder. I verified with the pt that the address we have on file is correct. Pt verbalized understanding of results. Pt had no questions at this time but was encouraged to call back if questions arise.

## 2017-10-07 NOTE — Telephone Encounter (Signed)
-----   Message from Larey Seat, MD sent at 10/06/2017  3:59 PM EDT ----- POLYSOMNOGRAPHY IMPRESSION :   1. REM dependent Obstructive Sleep Apnea (OSA) as diagnosed in  05-2016 at [facility] was sufficiently treated with CPAP at only  7 cm water.  2. Primary Snoring was alleviated under CPAP. 3. No evidence of Periodic Limb Movement Disorder (PLMD) 4. Post-study, the patient indicated that sleep was longer,  deeper than usual.  RECOMMENDATIONS: Medical weight management is recommended. I  wrote an Order for treatment with an auto titration capable CPAP  from a pressure of 4-8 cm water with 2 cm EPR, and use of an Eson  small nasal mask. Follow up for compliance will be scheduled.

## 2017-10-08 ENCOUNTER — Ambulatory Visit: Payer: BC Managed Care – PPO | Admitting: Cardiology

## 2017-10-10 ENCOUNTER — Ambulatory Visit (INDEPENDENT_AMBULATORY_CARE_PROVIDER_SITE_OTHER): Payer: BC Managed Care – PPO | Admitting: Cardiology

## 2017-10-10 ENCOUNTER — Encounter (INDEPENDENT_AMBULATORY_CARE_PROVIDER_SITE_OTHER): Payer: Self-pay | Admitting: Family Medicine

## 2017-10-10 ENCOUNTER — Ambulatory Visit (INDEPENDENT_AMBULATORY_CARE_PROVIDER_SITE_OTHER): Payer: BC Managed Care – PPO | Admitting: Family Medicine

## 2017-10-10 ENCOUNTER — Encounter: Payer: Self-pay | Admitting: Cardiology

## 2017-10-10 VITALS — BP 108/72 | HR 90 | Ht 64.0 in | Wt 254.8 lb

## 2017-10-10 VITALS — BP 125/89 | HR 67 | Temp 97.6°F | Ht 64.0 in | Wt 250.0 lb

## 2017-10-10 DIAGNOSIS — I10 Essential (primary) hypertension: Secondary | ICD-10-CM | POA: Diagnosis not present

## 2017-10-10 DIAGNOSIS — F419 Anxiety disorder, unspecified: Secondary | ICD-10-CM | POA: Diagnosis not present

## 2017-10-10 DIAGNOSIS — R5383 Other fatigue: Secondary | ICD-10-CM | POA: Insufficient documentation

## 2017-10-10 DIAGNOSIS — F418 Other specified anxiety disorders: Secondary | ICD-10-CM

## 2017-10-10 DIAGNOSIS — Z6841 Body Mass Index (BMI) 40.0 and over, adult: Secondary | ICD-10-CM

## 2017-10-10 DIAGNOSIS — R7303 Prediabetes: Secondary | ICD-10-CM | POA: Diagnosis not present

## 2017-10-10 DIAGNOSIS — I6930 Unspecified sequelae of cerebral infarction: Secondary | ICD-10-CM

## 2017-10-10 DIAGNOSIS — R0602 Shortness of breath: Secondary | ICD-10-CM | POA: Diagnosis not present

## 2017-10-10 DIAGNOSIS — Z0289 Encounter for other administrative examinations: Secondary | ICD-10-CM

## 2017-10-10 DIAGNOSIS — Z9189 Other specified personal risk factors, not elsewhere classified: Secondary | ICD-10-CM | POA: Diagnosis not present

## 2017-10-10 DIAGNOSIS — I480 Paroxysmal atrial fibrillation: Secondary | ICD-10-CM
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
## 2017-10-10 NOTE — Patient Instructions (Signed)
Medication Instructions:  Your physician recommends that you continue on your current medications as directed. Please refer to the Current Medication list given to you today.  Labwork: None  Testing/Procedures: Your physician has recommended that you wear an event monitor. Event monitors are medical devices that record the heart's electrical activity. Doctors most often [place] these monitors to diagnose arrhythmias. Arrhythmias are problems with the speed or rhythm of the heartbeat. The monitor is a small, portable device. You can wear one while you do your normal daily activities. This is usually used to diagnose what is causing palpitations/syncope (passing out).  Follow-Up: Your physician recommends that you schedule a follow-up appointment in: 6 months  Any Other Special Instructions Will Be Listed Below (If Applicable).     If you need a refill on your cardiac medications before your next appointment, please call your pharmacy.   [place], RN, BSN

## 2017-10-11 LAB — T4, FREE: FREE T4: 1.1 ng/dL (ref 0.82–1.77)

## 2017-10-11 LAB — VITAMIN B12: VITAMIN B 12: 461 pg/mL (ref 232–1245)

## 2017-10-11 LAB — TSH: TSH: 2.28 u[IU]/mL (ref 0.450–4.500)

## 2017-10-11 LAB — INSULIN, RANDOM: INSULIN: 5.5 u[IU]/mL (ref 2.6–24.9)

## 2017-10-11 LAB — VITAMIN D 25 HYDROXY (VIT D DEFICIENCY, FRACTURES): Vit D, 25-Hydroxy: 23.6 ng/mL — ABNORMAL LOW (ref 30.0–100.0)

## 2017-10-11 LAB — T3: T3 TOTAL: 128 ng/dL (ref 71–180)

## 2017-10-11 LAB — FOLATE: FOLATE: 5.3 ng/mL (ref >3.0)
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
## 2017-10-20 ENCOUNTER — Other Ambulatory Visit: Payer: Self-pay | Admitting: Neurology

## 2017-10-21 ENCOUNTER — Ambulatory Visit: Payer: BC Managed Care – PPO

## 2017-10-22 ENCOUNTER — Other Ambulatory Visit (INDEPENDENT_AMBULATORY_CARE_PROVIDER_SITE_OTHER): Payer: BC Managed Care – PPO

## 2017-10-22 ENCOUNTER — Encounter: Payer: Self-pay | Admitting: Internal Medicine

## 2017-10-22 ENCOUNTER — Ambulatory Visit (INDEPENDENT_AMBULATORY_CARE_PROVIDER_SITE_OTHER)
Admission: RE | Admit: 2017-10-22 | Discharge: 2017-10-22 | Disposition: A | Discharge status: Home / Self Care | Payer: BC Managed Care – PPO | Source: Ambulatory Visit | Attending: Internal Medicine | Admitting: Internal Medicine

## 2017-10-22 ENCOUNTER — Ambulatory Visit: Payer: BC Managed Care – PPO | Admitting: Internal Medicine

## 2017-10-22 ENCOUNTER — Ambulatory Visit (INDEPENDENT_AMBULATORY_CARE_PROVIDER_SITE_OTHER): Payer: BC Managed Care – PPO | Admitting: Family Medicine

## 2017-10-22 VITALS — BP 123/84 | HR 84 | Temp 97.9°F | Ht 64.0 in | Wt 248.0 lb

## 2017-10-22 VITALS — BP 128/82 | HR 79 | Ht 64.0 in | Wt 251.8 lb

## 2017-10-22 DIAGNOSIS — R0602 Shortness of breath: Secondary | ICD-10-CM | POA: Diagnosis not present

## 2017-10-22 DIAGNOSIS — Z8669 Personal history of other diseases of the nervous system and sense organs: Secondary | ICD-10-CM

## 2017-10-22 DIAGNOSIS — E559 Vitamin D deficiency, unspecified: Secondary | ICD-10-CM

## 2017-10-22 DIAGNOSIS — Z8673 Personal history of transient ischemic attack (TIA), and cerebral infarction without residual deficits: Secondary | ICD-10-CM

## 2017-10-22 DIAGNOSIS — Z6841 Body Mass Index (BMI) 40.0 and over, adult: Secondary | ICD-10-CM

## 2017-10-22 DIAGNOSIS — Z9189 Other specified personal risk factors, not elsewhere classified: Secondary | ICD-10-CM

## 2017-10-22 DIAGNOSIS — Z87898 Personal history of other specified conditions: Secondary | ICD-10-CM

## 2017-10-22 LAB — CBC WITH DIFFERENTIAL/PLATELET
BASOS PCT: 0.4 % (ref 0.0–3.0)
Basophils Absolute: 0 10*3/uL (ref 0.0–0.1)
Eosinophils Absolute: 0.2 10*3/uL (ref 0.0–0.7)
Eosinophils Relative: 1.9 % (ref 0.0–5.0)
HCT: 42.5 % (ref 36.0–46.0)
Hemoglobin: 14.5 g/dL (ref 12.0–15.0)
LYMPHS ABS: 1.6 10*3/uL (ref 0.7–4.0)
Lymphocytes Relative: 19.9 % (ref 12.0–46.0)
MCHC: 34.2 g/dL (ref 30.0–36.0)
MCV: 85.3 fl (ref 78.0–100.0)
MONOS PCT: 6.5 % (ref 3.0–12.0)
Monocytes Absolute: 0.5 10*3/uL (ref 0.1–1.0)
Neutro Abs: 5.9 10*3/uL (ref 1.4–7.7)
Neutrophils Relative %: 71.3 % (ref 43.0–77.0)
Platelets: 273 10*3/uL (ref 150.0–400.0)
RBC: 4.98 Mil/uL (ref 3.87–5.11)
RDW: 13.7 % (ref 11.5–15.5)
WBC: 8.3 10*3/uL (ref 4.0–10.5)

## 2017-10-22 MED ORDER — VITAMIN D (ERGOCALCIFEROL) 1.25 MG (50000 UNIT) PO CAPS: 50000.0000 [IU] | ORAL_CAPSULE | ORAL | 0 refills | Status: DC
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
## 2017-10-22 NOTE — Patient Instructions (Signed)
ICD-10-CM   1. Shortness of breath on exertion R06.02   2. History of wheezing Z87.898   3. History of obstructive sleep apnea Z86.69     Do CBC with differential count, IgE and blood allergy profile -today in our office Do chest x-ray two-view today in our office Do pulmonary stress test with exercise-induced bronchospasm challenge  -This is done at [facility] by [place] -Return to see nurse practitioner in few to several weeks but after completing all of the above

## 2017-10-23 ENCOUNTER — Encounter (INDEPENDENT_AMBULATORY_CARE_PROVIDER_SITE_OTHER): Payer: Self-pay | Admitting: Family Medicine

## 2017-10-23 LAB — RESPIRATORY ALLERGY PROFILE REGION II ~~LOC~~
Allergen, A. alternata, m6: <0.1 kU/L
Allergen, Comm Silver Birch, t9: <0.1 kU/L
Allergen, Cottonwood, t14: <0.1 kU/L
Allergen, Oak,t7: <0.1 kU/L
Allergen, P. notatum, m1: <0.1 kU/L
Bermuda Grass: <0.1 kU/L
CLADOSPORIUM HERBARUM (M2) IGE: <0.1 kU/L
CLASS: 0
CLASS: 0
CLASS: 0
CLASS: 0
CLASS: 0
CLASS: 0
CLASS: 0
CLASS: 0
COMMON RAGWEED (SHORT) (W1) IGE: <0.1 kU/L
Class: 0
Class: 0
Class: 0
Class: 0
Class: 0
Class: 0
Class: 0
Class: 0
Class: 0
Class: 0
Class: 0
Class: 0
Class: 0
Class: 0
Class: 0
Class: 0
Cockroach: <0.1 kU/L
Dog Dander: <0.1 kU/L
IgE (Immunoglobulin E), Serum: 23 kU/L (ref <=114)
Johnson Grass: <0.1 kU/L
Timothy Grass: <0.1 kU/L

## 2017-10-23 LAB — INTERPRETATION:
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
## 2017-10-27 ENCOUNTER — Other Ambulatory Visit: Payer: Self-pay | Admitting: Neurology

## 2017-10-28 DIAGNOSIS — K219 Gastro-esophageal reflux disease without esophagitis: Secondary | ICD-10-CM | POA: Insufficient documentation

## 2017-10-29 ENCOUNTER — Encounter (HOSPITAL_COMMUNITY): Payer: BC Managed Care – PPO

## 2017-10-29 ENCOUNTER — Ambulatory Visit: Payer: BC Managed Care – PPO | Admitting: Neurology

## 2017-10-29 ENCOUNTER — Telehealth: Payer: Self-pay | Admitting: Neurology

## 2017-10-29 ENCOUNTER — Other Ambulatory Visit: Payer: Self-pay | Admitting: Neurology

## 2017-10-29 MED ORDER — ZONISAMIDE 100 MG PO CAPS: 200.0000 mg | ORAL_CAPSULE | Freq: Every day | ORAL | 4 refills | Status: DC

## 2017-10-29 MED ORDER — ATORVASTATIN CALCIUM 10 MG PO TABS: 10.0000 mg | ORAL_TABLET | Freq: Every day | ORAL | 4 refills | Status: DC

## 2017-10-29 MED ORDER — DICLOFENAC POTASSIUM(MIGRAINE) 50 MG PO PACK: 50.0000 mg | PACK | Freq: Every day | ORAL | 11 refills | Status: DC | PRN

## 2017-10-29 MED ORDER — FLUOXETINE HCL 40 MG PO CAPS: 40.0000 mg | ORAL_CAPSULE | Freq: Every day | ORAL | 4 refills | Status: DC

## 2017-10-29 MED ORDER — ONDANSETRON 4 MG PO TBDP: 4.0000 mg | ORAL_TABLET | Freq: Three times a day (TID) | ORAL | 11 refills | Status: DC | PRN

## 2017-10-29 MED ORDER — BUPROPION HCL ER (SR) 150 MG PO TB12: 150.0000 mg | ORAL_TABLET | Freq: Two times a day (BID) | ORAL | 4 refills | Status: DC

## 2017-10-29 NOTE — Telephone Encounter (Signed)
Dr. Melvyn Novas please advise on patients email below thank you.

## 2017-10-29 NOTE — Telephone Encounter (Signed)
Sent to wrong provider by accident,   MR please advise on patients e-mail below

## 2017-10-29 NOTE — Telephone Encounter (Addendum)
[facility] Dr. Jaynee Eagles. Put in notes that is was MD's request and to not charge cx fee.

## 2017-10-29 NOTE — Telephone Encounter (Signed)
Patient was just started on cpap, will cancel her appointment today and give time for the cpap to help. She is feeling better already. Refilled her meds for a year. She will call back for follow up with me. Please cancel her appt today, doctor's decision (do not charge her a no show)

## 2017-10-29 NOTE — Telephone Encounter (Signed)
Noted  

## 2017-10-30 ENCOUNTER — Other Ambulatory Visit: Payer: Self-pay | Admitting: *Deleted

## 2017-10-30 MED ORDER — DICLOFENAC POTASSIUM(MIGRAINE) 50 MG PO PACK: 50.0000 mg | PACK | Freq: Every day | ORAL | 11 refills | Status: DC | PRN

## 2017-10-30 NOTE — Telephone Encounter (Signed)
Cambia sent to [facility] where they have a program in place for discounted to free cambia each month. [facility] and LVM on provider line asking for the Cambia 50 mg pack prescription to be canceled since we are sending to another pharmacy.

## 2017-10-30 NOTE — Telephone Encounter (Signed)
Blood allergy panel - negative Blod IgE - negative CBC with diff for eos - normal CCTR 10/22/17 - clear  Plan So al normal -> do CPST and return for followup     SIGNATURE    Dr. Brand Males, M.D., F.C.C.P,  Pulmonary and Critical Care Medicine Staff Physician, [place] Director - Interstitial Lung Disease  Program  [facility] [place] [facility] [place], [place], [address]  Pager: [phone], If no answer or between  15:00h - 7:00h: call [phone] Telephone: [phone]  9:14 AM 10/30/2017     Results for [name], [name] (MRN [id number]) as of 10/30/2017 09:12  Ref. Range 03/25/2014 15:49 10/22/2017 17:21  Sheep Sorrel IgE Latest Units: kU/L  <0.10  Pecan/Hickory Tree IgE Latest Units: kU/L  <0.10  Alpha Gal IgE* Latest Ref Range: <0.35 kU/L <0.10   IgE (Immunoglobulin E), Serum Latest Ref Range: <OR=114 kU/L  23  Allergen, D pternoyssinus,d7 Latest Units: kU/L  <0.10  Cat Dander Latest Units: kU/L  <0.10  Dog Dander Latest Units: kU/L  <0.10  [place] Grass Latest Units: kU/L  <0.10  Johnson Grass Latest Units: kU/L  <0.10  Timothy Grass Latest Units: kU/L  <0.10  Cockroach Latest Units: kU/L  <0.10  Aspergillus fumigatus, m3 Latest Units: kU/L  <0.10  Allergen, Comm Silver Wendee Copp, t9 Latest Units: kU/L  <0.10  Allergen, Cottonwood, t14 Latest Units: kU/L  <0.10  Elm IgE Latest Units: kU/L  <0.10  Allergen, Mulberry, t76 Latest Units: kU/L  <0.10  Allergen, Oak,t7 Latest Units: kU/L  <0.10  COMMON RAGWEED (SHORT) (W1) IGE Latest Units: kU/L  <0.10  Allergen, Mouse Urine Protein, e78 Latest Units: kU/L  <0.10  D. farinae Latest Units: kU/L  <0.10  Allergen, Cedar tree, t12 Latest Units: kU/L  <0.10  Box Elder IgE Latest Units: kU/L  <0.10  Rough Pigweed  IgE Latest Units: kU/L  <0.10

## 2017-10-31 ENCOUNTER — Telehealth: Payer: Self-pay | Admitting: Neurology

## 2017-10-31 NOTE — Telephone Encounter (Signed)
PA submitted through cover [facility] meds[facility]. KEY: [id number] PHD Can take 24 hrs before hearing a response

## 2017-11-01 NOTE — Telephone Encounter (Signed)
[facility] responded stating that the medication was denied. Will have to work on a appeal for the patient.

## 2017-11-04 NOTE — Telephone Encounter (Signed)
The medication was sent to [facility] where the patient can still get the medication through a savings program they have in place. They complete the PAs for the medication. It should be $20 to have it filled since insurance denied. I would just call [facility] to make sure they are in contact with the patient to have it filled.

## 2017-11-04 NOTE — Telephone Encounter (Signed)
Called the pharmacy and they have attempted to work with the pt, they have contacted the patient on 3 different occasions and she has not returned their call. The patient would just need to contact the pharmacy and let them know when she is ready to fill it and they will fill it for her for 20$.

## 2017-11-05 ENCOUNTER — Encounter (INDEPENDENT_AMBULATORY_CARE_PROVIDER_SITE_OTHER): Payer: Self-pay

## 2017-11-05 ENCOUNTER — Ambulatory Visit (INDEPENDENT_AMBULATORY_CARE_PROVIDER_SITE_OTHER): Payer: BC Managed Care – PPO | Admitting: Bariatrics

## 2017-11-06 ENCOUNTER — Ambulatory Visit (INDEPENDENT_AMBULATORY_CARE_PROVIDER_SITE_OTHER): Payer: BC Managed Care – PPO | Admitting: Family Medicine

## 2017-11-06 ENCOUNTER — Ambulatory Visit (HOSPITAL_COMMUNITY): Payer: BC Managed Care – PPO | Attending: Internal Medicine

## 2017-11-06 ENCOUNTER — Other Ambulatory Visit (HOSPITAL_COMMUNITY): Payer: Self-pay | Admitting: *Deleted

## 2017-11-06 VITALS — BP 120/77 | HR 68 | Temp 97.7°F | Ht 64.0 in | Wt 241.0 lb

## 2017-11-06 DIAGNOSIS — R0600 Dyspnea, unspecified: Secondary | ICD-10-CM | POA: Diagnosis not present

## 2017-11-06 DIAGNOSIS — E559 Vitamin D deficiency, unspecified: Secondary | ICD-10-CM

## 2017-11-06 DIAGNOSIS — R0602 Shortness of breath: Secondary | ICD-10-CM | POA: Insufficient documentation

## 2017-11-06 DIAGNOSIS — Z6841 Body Mass Index (BMI) 40.0 and over, adult: Secondary | ICD-10-CM

## 2017-11-06 DIAGNOSIS — Z9189 Other specified personal risk factors, not elsewhere classified: Secondary | ICD-10-CM

## 2017-11-06 DIAGNOSIS — F3289 Other specified depressive episodes: Secondary | ICD-10-CM

## 2017-11-07 MED ORDER — VITAMIN D (ERGOCALCIFEROL) 1.25 MG (50000 UNIT) PO CAPS: 50000.0000 [IU] | ORAL_CAPSULE | ORAL | 0 refills | Status: DC
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
## 2017-11-25 ENCOUNTER — Other Ambulatory Visit: Payer: Self-pay | Admitting: Internal Medicine

## 2017-11-25 ENCOUNTER — Encounter (INDEPENDENT_AMBULATORY_CARE_PROVIDER_SITE_OTHER): Payer: Self-pay | Admitting: Bariatrics

## 2017-11-25 ENCOUNTER — Ambulatory Visit (INDEPENDENT_AMBULATORY_CARE_PROVIDER_SITE_OTHER): Payer: BC Managed Care – PPO | Admitting: Bariatrics

## 2017-11-25 VITALS — BP 119/74 | HR 80 | Temp 98.3°F | Ht 64.0 in | Wt 238.0 lb

## 2017-11-25 DIAGNOSIS — Z6841 Body Mass Index (BMI) 40.0 and over, adult: Secondary | ICD-10-CM

## 2017-11-25 DIAGNOSIS — F3289 Other specified depressive episodes: Secondary | ICD-10-CM | POA: Diagnosis not present

## 2017-11-25 DIAGNOSIS — E559 Vitamin D deficiency, unspecified: Secondary | ICD-10-CM

## 2017-11-25 DIAGNOSIS — I959 Hypotension, unspecified: Secondary | ICD-10-CM

## 2017-11-25 NOTE — Telephone Encounter (Signed)
MR, [name] had a stress test on 11/06/17 and is asking for the results if possible. Please advise. Thanks!

## 2017-11-25 NOTE — Telephone Encounter (Signed)
Apologies for delay but the original instruction was to give appt following test and there is none on epic. In any event results are below. Her weight is making her dyspneic but do not understand why BP fell after exercising and there is suggestion of potential  Heart issues  Plan  - please refer to Dr Jeffie Pollock or Aundra Dubin for right heart cath evaluation and cardiac evaluation  - please give her followup to see me some weeks after above - if she is not satisfied with above plan then please give appt to see either myself or Dr Vaughan Browner or an APP (Tammy orSarah) to go over the results  Thanks  .  SIGNATURE    Dr. Brand Males, M.D., F.C.C.P,  Pulmonary and Critical Care Medicine Staff Physician, [place] Director - Interstitial Lung Disease  Program  Pulmonary [place] [facility] [place], [place], [address]  Pager: [phone], If no answer or between  15:00h - 7:00h: call [phone] Telephone: [phone]  10:53 AM 11/25/2017      The interpretation of this test is limited due to submaximal effort during the exercise. Based on available data, exercise testing with gas exchange demonstrates mild functional impairment when compared to matched sedentary norms. Patient is clearly limited due to her body habitus and deconditioning, however elevated VE/VCO2 slope and reduced DeltaVO2/Delta WR indicate possible cardiovascular limitations. Patient became symptomatic post-exercise with a sudden BP drop immediately into passive recovery.   Test, report and preliminary impression by: Landis Martins, MS, ACSM-RCEP 11/06/2017 4:51 PM

## 2017-11-26 DIAGNOSIS — E66813 Obesity, class 3: Secondary | ICD-10-CM | POA: Insufficient documentation

## 2017-11-26 DIAGNOSIS — Z6841 Body Mass Index (BMI) 40.0 and over, adult: Secondary | ICD-10-CM
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
## 2017-12-11 ENCOUNTER — Ambulatory Visit (INDEPENDENT_AMBULATORY_CARE_PROVIDER_SITE_OTHER): Payer: BC Managed Care – PPO | Admitting: Family Medicine

## 2017-12-11 VITALS — BP 119/79 | HR 97 | Temp 97.9°F | Ht 64.0 in | Wt 237.0 lb

## 2017-12-11 DIAGNOSIS — Z9189 Other specified personal risk factors, not elsewhere classified: Secondary | ICD-10-CM | POA: Diagnosis not present

## 2017-12-11 DIAGNOSIS — E785 Hyperlipidemia, unspecified: Secondary | ICD-10-CM | POA: Diagnosis not present

## 2017-12-11 DIAGNOSIS — E559 Vitamin D deficiency, unspecified: Secondary | ICD-10-CM

## 2017-12-11 DIAGNOSIS — Z6841 Body Mass Index (BMI) 40.0 and over, adult: Secondary | ICD-10-CM

## 2017-12-11 MED ORDER — VITAMIN D (ERGOCALCIFEROL) 1.25 MG (50000 UNIT) PO CAPS: 50000.0000 [IU] | ORAL_CAPSULE | ORAL | 0 refills | Status: DC
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
## 2018-01-02 ENCOUNTER — Ambulatory Visit (INDEPENDENT_AMBULATORY_CARE_PROVIDER_SITE_OTHER): Payer: BC Managed Care – PPO | Admitting: Family Medicine

## 2018-01-02 ENCOUNTER — Encounter (INDEPENDENT_AMBULATORY_CARE_PROVIDER_SITE_OTHER): Payer: Self-pay | Admitting: Family Medicine

## 2018-01-02 VITALS — BP 120/81 | HR 75 | Temp 97.5°F | Ht 64.0 in | Wt 233.0 lb

## 2018-01-02 DIAGNOSIS — Z9189 Other specified personal risk factors, not elsewhere classified: Secondary | ICD-10-CM

## 2018-01-02 DIAGNOSIS — E559 Vitamin D deficiency, unspecified: Secondary | ICD-10-CM | POA: Diagnosis not present

## 2018-01-02 DIAGNOSIS — I10 Essential (primary) hypertension: Secondary | ICD-10-CM

## 2018-01-02 DIAGNOSIS — Z6841 Body Mass Index (BMI) 40.0 and over, adult: Secondary | ICD-10-CM

## 2018-01-02 MED ORDER — VITAMIN D (ERGOCALCIFEROL) 1.25 MG (50000 UNIT) PO CAPS: 50000.0000 [IU] | ORAL_CAPSULE | ORAL | 0 refills | Status: DC
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
## 2018-01-13 ENCOUNTER — Telehealth: Payer: Self-pay | Admitting: *Deleted

## 2018-01-13 NOTE — Telephone Encounter (Signed)
LVM requesting patient call back to discuss CPAP compliance prior to her FU on Thursday.

## 2018-01-13 NOTE — Telephone Encounter (Addendum)
Patient returned call and stated she was sick for three weeks. She is just now recovering. This RN advised her CPAP FU needs to be rescheduled for insurance compliance. Advised it be moved to mid Feb. The patient agreed, and it was rescheduled with Daun Peacock, NP for 03/03/17. Patient verbalized understanding, appreciation.

## 2018-01-16 ENCOUNTER — Ambulatory Visit: Payer: Self-pay | Admitting: Adult Health

## 2018-01-21 ENCOUNTER — Encounter (INDEPENDENT_AMBULATORY_CARE_PROVIDER_SITE_OTHER): Payer: Self-pay | Admitting: Bariatrics

## 2018-01-21 ENCOUNTER — Ambulatory Visit (INDEPENDENT_AMBULATORY_CARE_PROVIDER_SITE_OTHER): Payer: BC Managed Care – PPO | Admitting: Bariatrics

## 2018-01-21 VITALS — BP 136/82 | HR 75 | Temp 98.0°F | Ht 64.0 in | Wt 234.0 lb

## 2018-01-21 DIAGNOSIS — I10 Essential (primary) hypertension: Secondary | ICD-10-CM

## 2018-01-21 DIAGNOSIS — K5909 Other constipation: Secondary | ICD-10-CM | POA: Diagnosis not present

## 2018-01-21 DIAGNOSIS — Z9189 Other specified personal risk factors, not elsewhere classified: Secondary | ICD-10-CM

## 2018-01-21 DIAGNOSIS — E559 Vitamin D deficiency, unspecified: Secondary | ICD-10-CM | POA: Diagnosis not present

## 2018-01-21 DIAGNOSIS — Z6841 Body Mass Index (BMI) 40.0 and over, adult: Secondary | ICD-10-CM

## 2018-01-21 MED ORDER — VITAMIN D (ERGOCALCIFEROL) 1.25 MG (50000 UNIT) PO CAPS: 50000.0000 [IU] | ORAL_CAPSULE | ORAL | 0 refills | Status: DC

## 2018-01-22 ENCOUNTER — Encounter (INDEPENDENT_AMBULATORY_CARE_PROVIDER_SITE_OTHER): Payer: Self-pay | Admitting: Bariatrics
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
## 2018-01-28 ENCOUNTER — Ambulatory Visit: Payer: BC Managed Care – PPO | Admitting: Cardiology

## 2018-01-30 ENCOUNTER — Ambulatory Visit: Payer: BC Managed Care – PPO | Admitting: Cardiology

## 2018-02-03 ENCOUNTER — Telehealth: Payer: Self-pay | Admitting: Internal Medicine

## 2018-02-03 NOTE — Telephone Encounter (Signed)
She had CPST late oct 2019 and in early nov 2019 I did the below phone message but I am not sure I routed it correctly v our office called her. In any event here are results    " Her weight is making her dyspneic but do not understand why BP fell after exercising and there is suggestion of potential  Heart issues  Plan  - please refer to Dr Jeffie Pollock or Aundra Dubin for right heart cath evaluation and cardiac evaluation  - please give her followup to see me some weeks after above - if she is not satisfied with above plan then please give appt to see either myself or Dr Vaughan Browner or an APP (Tammy orSarah) to go over the results  Thanks

## 2018-02-04 ENCOUNTER — Encounter (INDEPENDENT_AMBULATORY_CARE_PROVIDER_SITE_OTHER): Payer: Self-pay | Admitting: Bariatrics

## 2018-02-04 ENCOUNTER — Ambulatory Visit (INDEPENDENT_AMBULATORY_CARE_PROVIDER_SITE_OTHER): Payer: BC Managed Care – PPO | Admitting: Bariatrics

## 2018-02-04 VITALS — BP 121/88 | HR 93 | Temp 97.8°F | Ht 64.0 in | Wt 228.0 lb

## 2018-02-04 DIAGNOSIS — I10 Essential (primary) hypertension: Secondary | ICD-10-CM | POA: Diagnosis not present

## 2018-02-04 DIAGNOSIS — E559 Vitamin D deficiency, unspecified: Secondary | ICD-10-CM

## 2018-02-04 DIAGNOSIS — Z9189 Other specified personal risk factors, not elsewhere classified: Secondary | ICD-10-CM

## 2018-02-04 DIAGNOSIS — Z6839 Body mass index (BMI) 39.0-39.9, adult: Secondary | ICD-10-CM

## 2018-02-04 DIAGNOSIS — E66812 Obesity, class 2: Secondary | ICD-10-CM

## 2018-02-04 MED ORDER — VITAMIN D (ERGOCALCIFEROL) 1.25 MG (50000 UNIT) PO CAPS: 50000.0000 [IU] | ORAL_CAPSULE | ORAL | 0 refills | Status: DC

## 2018-02-05 NOTE — Telephone Encounter (Signed)
Pt has an appt scheduled with cardiology 02/12/2018 with Dr. Jenne Campus.    Called and spoke with pt stating to her that MR wanted her to follow up after cards visit to see what results of that were. Pt expressed understanding. I have scheduled pt a f/u with MR Friday, 2/21 at 9:15am. Nothing further needed.
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
## 2018-02-12 ENCOUNTER — Encounter: Payer: Self-pay | Admitting: Cardiology

## 2018-02-12 ENCOUNTER — Ambulatory Visit: Payer: BC Managed Care – PPO | Admitting: Cardiology

## 2018-02-12 ENCOUNTER — Encounter

## 2018-02-12 VITALS — BP 100/70 | HR 87 | Ht 64.0 in | Wt 231.1 lb

## 2018-02-12 DIAGNOSIS — I6930 Unspecified sequelae of cerebral infarction: Secondary | ICD-10-CM

## 2018-02-12 DIAGNOSIS — I10 Essential (primary) hypertension: Secondary | ICD-10-CM | POA: Diagnosis not present

## 2018-02-12 DIAGNOSIS — G4733 Obstructive sleep apnea (adult) (pediatric): Secondary | ICD-10-CM

## 2018-02-12 DIAGNOSIS — I639 Cerebral infarction, unspecified: Secondary | ICD-10-CM
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
## 2018-02-12 NOTE — Patient Instructions (Signed)
Medication Instructions:  Your physician recommends that you continue on your current medications as directed. Please refer to the Current Medication list given to you today.  If you need a refill on your cardiac medications before your next appointment, please call your pharmacy.   Lab work: None  If you have labs (blood work) drawn today and your tests are completely normal, you will receive your results only by: Marland Kitchen MyChart Message (if you have MyChart) OR . A paper copy in the mail If you have any lab test that is abnormal or we need to change your treatment, we will call you to review the results.  Testing/Procedures: You will be contacted with additional information regarding implantable loop recorder placement.   Follow-Up: At [facility], you and your health needs are our priority.  As part of our continuing mission to provide you with exceptional heart care, we have created designated Provider Care Teams.  These Care Teams include your primary Cardiologist (physician) and Advanced Practice Providers (APPs -  Physician Assistants and Nurse Practitioners) who all work together to provide you with the care you need, when you need it. You will need a follow up appointment in 6 months.  Please call our office 2 months in advance to schedule this appointment.

## 2018-02-13 ENCOUNTER — Telehealth: Payer: Self-pay | Admitting: *Deleted

## 2018-02-13 NOTE — Telephone Encounter (Signed)
Left message on patient's cell phone per DPR advising that electrophysiology referral has been placed to further discuss placement of an implantable loop recorder. Informed her that Dr. Macky Lower office will be contacting her to schedule an appointment at the [facility] office. Advised her to contact our office with any further questions or concerns.

## 2018-02-13 NOTE — Addendum Note (Signed)
Addended by: Austin Miles on: 02/13/2018 09:09 AM   Modules accepted: Orders

## 2018-02-16 ENCOUNTER — Emergency Department (HOSPITAL_COMMUNITY): Payer: BC Managed Care – PPO

## 2018-02-16 ENCOUNTER — Encounter (HOSPITAL_COMMUNITY): Payer: Self-pay | Admitting: Emergency Medicine

## 2018-02-16 ENCOUNTER — Other Ambulatory Visit: Payer: Self-pay

## 2018-02-16 ENCOUNTER — Emergency Department (HOSPITAL_COMMUNITY)
Admission: EM | Admit: 2018-02-16 | Discharge: 2018-02-17 | Disposition: A | Discharge status: Home / Self Care | Payer: BC Managed Care – PPO | Attending: Emergency Medicine | Admitting: Emergency Medicine

## 2018-02-16 DIAGNOSIS — R531 Weakness: Secondary | ICD-10-CM | POA: Diagnosis present

## 2018-02-16 DIAGNOSIS — G43909 Migraine, unspecified, not intractable, without status migrainosus: Secondary | ICD-10-CM | POA: Diagnosis not present

## 2018-02-16 LAB — CBC
HCT: 41.4 % (ref 36.0–46.0)
Hemoglobin: 13.4 g/dL (ref 12.0–15.0)
MCH: 28.4 pg (ref 26.0–34.0)
MCHC: 32.4 g/dL (ref 30.0–36.0)
MCV: 87.7 fL (ref 80.0–100.0)
Platelets: 256 10*3/uL (ref 150–400)
RBC: 4.72 MIL/uL (ref 3.87–5.11)
RDW: 13.2 % (ref 11.5–15.5)
WBC: 6.7 10*3/uL (ref 4.0–10.5)
nRBC: 0 % (ref 0.0–0.2)

## 2018-02-16 LAB — COMPREHENSIVE METABOLIC PANEL
ALK PHOS: 56 U/L (ref 38–126)
ALT: 24 U/L (ref 0–44)
AST: 18 U/L (ref 15–41)
Albumin: 3.7 g/dL (ref 3.5–5.0)
Anion gap: 8 (ref 5–15)
BILIRUBIN TOTAL: 0.7 mg/dL (ref 0.3–1.2)
BUN: 12 mg/dL (ref 6–20)
CO2: 21 mmol/L — ABNORMAL LOW (ref 22–32)
Calcium: 9.3 mg/dL (ref 8.9–10.3)
Chloride: 108 mmol/L (ref 98–111)
Creatinine, Ser: 0.58 mg/dL (ref 0.44–1.00)
GFR calc Af Amer: >60 mL/min (ref >60)
GFR calc non Af Amer: >60 mL/min (ref >60)
Glucose, Bld: 84 mg/dL (ref 70–99)
Potassium: 3.9 mmol/L (ref 3.5–5.1)
Sodium: 137 mmol/L (ref 135–145)
Total Protein: 6.6 g/dL (ref 6.5–8.1)

## 2018-02-16 LAB — DIFFERENTIAL
Abs Immature Granulocytes: 0.03 10*3/uL (ref 0.00–0.07)
Basophils Absolute: 0 10*3/uL (ref 0.0–0.1)
Basophils Relative: 1 %
Eosinophils Absolute: 0.2 10*3/uL (ref 0.0–0.5)
Eosinophils Relative: 4 %
Immature Granulocytes: 0 %
Lymphocytes Relative: 30 %
Lymphs Abs: 2 10*3/uL (ref 0.7–4.0)
Monocytes Absolute: 0.4 10*3/uL (ref 0.1–1.0)
Monocytes Relative: 6 %
Neutro Abs: 3.9 10*3/uL (ref 1.7–7.7)
Neutrophils Relative %: 59 %

## 2018-02-16 LAB — I-STAT BETA HCG BLOOD, ED (MC, WL, AP ONLY): I-stat hCG, quantitative: <5 m[IU]/mL (ref <5)

## 2018-02-16 LAB — PROTIME-INR
INR: 1
Prothrombin Time: 13.1 seconds (ref 11.4–15.2)

## 2018-02-16 LAB — CBG MONITORING, ED: Glucose-Capillary: 76 mg/dL (ref 70–99)

## 2018-02-16 LAB — I-STAT TROPONIN, ED: TROPONIN I, POC: 0 ng/mL (ref 0.00–0.08)

## 2018-02-16 LAB — APTT: aPTT: 32 seconds (ref 24–36)

## 2018-02-16 MED ORDER — SODIUM CHLORIDE 0.9 % IV BOLUS
1000.0000 mL | Freq: Once | INTRAVENOUS | Status: AC
  Administered 2018-02-16: 1000 mL via INTRAVENOUS

## 2018-02-16 MED ORDER — SODIUM CHLORIDE 0.9% FLUSH
3.0000 mL | Freq: Once | INTRAVENOUS | Status: DC
Start: 2018-02-16 — End: 2018-02-17

## 2018-02-16 MED ORDER — PROCHLORPERAZINE EDISYLATE 10 MG/2ML IJ SOLN
10.0000 mg | Freq: Once | INTRAMUSCULAR | Status: AC
  Administered 2018-02-16: 10 mg via INTRAVENOUS
  Filled 2018-02-16: qty 2

## 2018-02-16 MED ORDER — DIPHENHYDRAMINE HCL 50 MG/ML IJ SOLN
25.0000 mg | Freq: Once | INTRAMUSCULAR | Status: AC
  Administered 2018-02-16: 25 mg via INTRAVENOUS
  Filled 2018-02-16: qty 1
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
## 2018-02-16 NOTE — ED Triage Notes (Signed)
Pt was at home today, at 1700 she started to have a sharp migraine and noticed weakness on her left side. NIH at arrival was 1 for drift in left arm. Pt stated she had a panic attack at home today when she had her migraine she took xanax at home at that time.
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
## 2018-02-17 NOTE — Discharge Instructions (Addendum)
You were evaluated in the Emergency Department and after careful evaluation, we did not find any emergent condition requiring admission or further testing in the hospital.  Your symptoms today seem to be due to a complex migraine.  Your MRI did not reveal any new strokes.  Please return to the Emergency Department if you experience any worsening of your condition.  We encourage you to follow up with a primary care provider.  Thank you for allowing [place] to be a part of your care.

## 2018-02-17 NOTE — ED Notes (Signed)
Patient verbalizes understanding of discharge instructions. Opportunity for questioning and answers were provided. Armband removed by staff, pt discharged from ED ambulatory.   

## 2018-02-18 ENCOUNTER — Encounter (INDEPENDENT_AMBULATORY_CARE_PROVIDER_SITE_OTHER): Payer: Self-pay | Admitting: Bariatrics

## 2018-02-18 ENCOUNTER — Ambulatory Visit (INDEPENDENT_AMBULATORY_CARE_PROVIDER_SITE_OTHER): Payer: BC Managed Care – PPO | Admitting: Bariatrics

## 2018-02-18 VITALS — BP 134/90 | HR 91 | Temp 97.7°F | Ht 64.0 in | Wt 225.0 lb

## 2018-02-18 DIAGNOSIS — E559 Vitamin D deficiency, unspecified: Secondary | ICD-10-CM

## 2018-02-18 DIAGNOSIS — Z6838 Body mass index (BMI) 38.0-38.9, adult: Secondary | ICD-10-CM

## 2018-02-18 DIAGNOSIS — E7800 Pure hypercholesterolemia, unspecified: Secondary | ICD-10-CM
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
## 2018-02-24 ENCOUNTER — Encounter: Payer: Self-pay | Admitting: Internal Medicine

## 2018-02-24 ENCOUNTER — Ambulatory Visit: Payer: BC Managed Care – PPO | Admitting: Internal Medicine

## 2018-02-24 VITALS — BP 108/76 | HR 82 | Ht 64.0 in | Wt 230.6 lb

## 2018-02-24 DIAGNOSIS — I639 Cerebral infarction, unspecified: Secondary | ICD-10-CM | POA: Diagnosis not present

## 2018-02-24 DIAGNOSIS — I480 Paroxysmal atrial fibrillation: Secondary | ICD-10-CM

## 2018-02-24 DIAGNOSIS — I10 Essential (primary) hypertension: Secondary | ICD-10-CM | POA: Diagnosis not present

## 2018-02-24 NOTE — Patient Instructions (Signed)
Medication Instructions:  Your physician recommends that you continue on your current medications as directed. Please refer to the Current Medication list given to you today.  Labwork: None ordered.  Testing/Procedures: None ordered.  Follow-Up: Your physician recommends that you schedule a follow-up appointment in:   As needed with Dr Caryl Comes  Any Other Special Instructions Will Be Listed Below (If Applicable).     If you need a refill on your cardiac medications before your next appointment, please call your pharmacy.
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
## 2018-02-25 ENCOUNTER — Encounter: Payer: Self-pay | Admitting: Family Medicine

## 2018-02-25 ENCOUNTER — Telehealth: Payer: Self-pay | Admitting: Neurology
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
## 2018-02-25 NOTE — Telephone Encounter (Signed)
[name] - can you pull her recent cpap compliance report and we may need to call her for a sooner appointment for her cpap.

## 2018-02-26 ENCOUNTER — Inpatient Hospital Stay: Payer: BC Managed Care – PPO

## 2018-02-26 ENCOUNTER — Inpatient Hospital Stay: Payer: BC Managed Care – PPO | Attending: Hematology | Admitting: Hematology

## 2018-02-26 ENCOUNTER — Encounter: Payer: Self-pay | Admitting: Family Medicine

## 2018-02-26 VITALS — BP 130/74 | HR 84 | Temp 97.6°F | Resp 18 | Ht 64.0 in | Wt 232.3 lb

## 2018-02-26 DIAGNOSIS — E538 Deficiency of other specified B group vitamins: Secondary | ICD-10-CM

## 2018-02-26 DIAGNOSIS — D509 Iron deficiency anemia, unspecified: Secondary | ICD-10-CM

## 2018-02-26 DIAGNOSIS — K900 Celiac disease: Secondary | ICD-10-CM

## 2018-02-26 LAB — CBC WITH DIFFERENTIAL/PLATELET
Abs Immature Granulocytes: 0.03 10*3/uL (ref 0.00–0.07)
BASOS PCT: 0 %
Basophils Absolute: 0 10*3/uL (ref 0.0–0.1)
Eosinophils Absolute: 0.2 10*3/uL (ref 0.0–0.5)
Eosinophils Relative: 2 %
HCT: 41.3 % (ref 36.0–46.0)
Hemoglobin: 13.7 g/dL (ref 12.0–15.0)
Immature Granulocytes: 0 %
Lymphocytes Relative: 21 %
Lymphs Abs: 1.7 10*3/uL (ref 0.7–4.0)
MCH: 29.5 pg (ref 26.0–34.0)
MCHC: 33.2 g/dL (ref 30.0–36.0)
MCV: 88.8 fL (ref 80.0–100.0)
MONO ABS: 0.4 10*3/uL (ref 0.1–1.0)
MONOS PCT: 5 %
Neutro Abs: 5.6 10*3/uL (ref 1.7–7.7)
Neutrophils Relative %: 72 %
Platelets: 269 10*3/uL (ref 150–400)
RBC: 4.65 MIL/uL (ref 3.87–5.11)
RDW: 13.7 % (ref 11.5–15.5)
WBC: 7.8 10*3/uL (ref 4.0–10.5)
nRBC: 0 % (ref 0.0–0.2)

## 2018-02-26 LAB — CMP (CANCER CENTER ONLY)
ALT: 19 U/L (ref 0–44)
AST: 17 U/L (ref 15–41)
Albumin: 4.1 g/dL (ref 3.5–5.0)
Alkaline Phosphatase: 66 U/L (ref 38–126)
Anion gap: 8 (ref 5–15)
BUN: 14 mg/dL (ref 6–20)
CO2: 23 mmol/L (ref 22–32)
Calcium: 9.5 mg/dL (ref 8.9–10.3)
Chloride: 110 mmol/L (ref 98–111)
Creatinine: 0.76 mg/dL (ref 0.44–1.00)
GFR, Est AFR Am: >60 mL/min (ref >60)
GFR, Estimated: >60 mL/min (ref >60)
GLUCOSE: 86 mg/dL (ref 70–99)
Potassium: 3.8 mmol/L (ref 3.5–5.1)
Sodium: 141 mmol/L (ref 135–145)
TOTAL PROTEIN: 7.1 g/dL (ref 6.5–8.1)
Total Bilirubin: 0.5 mg/dL (ref 0.3–1.2)

## 2018-02-26 LAB — VITAMIN B12: VITAMIN B 12: 543 pg/mL (ref 180–914)

## 2018-02-26 LAB — FERRITIN: Ferritin: 128 ng/mL (ref 11–307)

## 2018-02-26 NOTE — Telephone Encounter (Signed)
Printed and ready for review.

## 2018-02-27 ENCOUNTER — Telehealth: Payer: Self-pay | Admitting: Hematology

## 2018-02-27 ENCOUNTER — Encounter: Payer: Self-pay | Admitting: Family Medicine

## 2018-02-27 ENCOUNTER — Ambulatory Visit: Payer: BC Managed Care – PPO | Admitting: Family Medicine

## 2018-02-27 VITALS — BP 117/74 | HR 87 | Ht 64.0 in | Wt 231.8 lb

## 2018-02-27 DIAGNOSIS — G43109 Migraine with aura, not intractable, without status migrainosus: Secondary | ICD-10-CM

## 2018-02-27 MED ORDER — FREMANEZUMAB-VFRM 225 MG/1.5ML ~~LOC~~ SOSY: 225.0000 mg | PREFILLED_SYRINGE | SUBCUTANEOUS | 0 refills | Status: DC

## 2018-02-27 MED ORDER — KETOROLAC TROMETHAMINE 60 MG/2ML IM SOLN
60.0000 mg | Freq: Once | INTRAMUSCULAR | Status: AC
  Administered 2018-02-27: 60 mg via INTRAMUSCULAR

## 2018-02-27 MED ORDER — UBROGEPANT 100 MG PO TABS: 100.0000 mg | ORAL_TABLET | Freq: Every day | ORAL | 11 refills | Status: DC | PRN

## 2018-02-27 MED ORDER — FREMANEZUMAB-VFRM 225 MG/1.5ML ~~LOC~~ SOSY: 225.0000 mg | PREFILLED_SYRINGE | SUBCUTANEOUS | 11 refills | Status: DC

## 2018-02-27 NOTE — Patient Instructions (Signed)
Start Ajovy as prescribed. First dose given in office, next dose due on 03/28/2018.  [name] as prescribed for abortive therapy. Stop Ansaid. Avoid OTC analgesics. Continue nightly use of CPAP and for greater than 4 hours each night   Migraine Headache A migraine headache is an intense, throbbing pain on one side or both sides of the head. Migraines may also cause other symptoms, such as nausea, vomiting, and sensitivity to light and noise. What are the causes? Doing or taking certain things may also trigger migraines, such as:  Alcohol.  Smoking.  Medicines, such as: ? Medicine used to treat chest pain (nitroglycerine). ? Birth control pills. ? Estrogen pills. ? Certain blood pressure medicines.  Aged cheeses, chocolate, or caffeine.  Foods or drinks that contain nitrates, glutamate, aspartame, or tyramine.  Physical activity. Other things that may trigger a migraine include:  Menstruation.  Pregnancy.  Hunger.  Stress, lack of sleep, too much sleep, or fatigue.  Weather changes. What increases the risk? The following factors may make you more likely to experience migraine headaches:  Age. Risk increases with age.  Family history of migraine headaches.  Being Caucasian.  Depression and anxiety.  Obesity.  Being a woman.  Having a hole in the heart (patent foramen ovale) or other heart problems. What are the signs or symptoms? The main symptom of this condition is pulsating or throbbing pain. Pain may:  Happen in any area of the head, such as on one side or both sides.  Interfere with daily activities.  Get worse with physical activity.  Get worse with exposure to bright lights or loud noises. Other symptoms may include:  Nausea.  Vomiting.  Dizziness.  General sensitivity to bright lights, loud noises, or smells. Before you get a migraine, you may get warning signs that a migraine is developing (aura). An aura may include:  Seeing flashing  lights or having blind spots.  Seeing bright spots, halos, or zigzag lines.  Having tunnel vision or blurred vision.  Having numbness or a tingling feeling.  Having trouble talking.  Having muscle weakness. How is this diagnosed? A migraine headache can be diagnosed based on:  Your symptoms.  A physical exam.  Tests, such as CT scan or MRI of the head. These imaging tests can help rule out other causes of headaches.  Taking fluid from the spine (lumbar puncture) and analyzing it (cerebrospinal fluid analysis, or CSF analysis). How is this treated? A migraine headache is usually treated with medicines that:  Relieve pain.  Relieve nausea.  Prevent migraines from coming back. Treatment may also include:  Acupuncture.  Lifestyle changes like avoiding foods that trigger migraines. Follow these instructions at home: Medicines  Take over-the-counter and prescription medicines only as told by your health care provider.  Do not drive or use heavy machinery while taking prescription pain medicine.  To prevent or treat constipation while you are taking prescription pain medicine, your health care provider may recommend that you: ? Drink enough fluid to keep your urine clear or pale yellow. ? Take over-the-counter or prescription medicines. ? Eat foods that are high in fiber, such as fresh fruits and vegetables, whole grains, and beans. ? Limit foods that are high in fat and processed sugars, such as fried and sweet foods. Lifestyle  Avoid alcohol use.  Do not use any products that contain nicotine or tobacco, such as cigarettes and e-cigarettes. If you need help quitting, ask your health care provider.  Get at least 8 hours of  sleep every night.  Limit your stress. General instructions      Keep a journal to find out what may trigger your migraine headaches. For example, write down: ? What you eat and drink. ? How much sleep you get. ? Any change to your diet or  medicines.  If you have a migraine: ? Avoid things that make your symptoms worse, such as bright lights. ? It may help to lie down in a dark, quiet room. ? Do not drive or use heavy machinery. ? Ask your health care provider what activities are safe for you while you are experiencing symptoms.  Keep all follow-up visits as told by your health care provider. This is important. Contact a health care provider if:  You develop symptoms that are different or more severe than your usual migraine symptoms. Get help right away if:  Your migraine becomes severe.  You have a fever.  You have a stiff neck.  You have vision loss.  Your muscles feel weak or like you cannot control them.  You start to lose your balance often.  You develop trouble walking.  You faint. This information is not intended to replace advice given to you by your health care provider. Make sure you discuss any questions you have with your health care provider. Document Released: 01/01/2005 Document Revised: 07/22/2015 Document Reviewed: 06/20/2015 Elsevier Interactive Patient Education  2019 [employer].
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
## 2018-02-27 NOTE — Progress Notes (Signed)
Personally  participated in, made any corrections needed, and agree with history, physical, neuro exam,assessment and plan as stated.     [name], MD [facility]     

## 2018-02-27 NOTE — Telephone Encounter (Signed)
Per 2/12 los RTC with dr Irene Limbo as needed.

## 2018-03-03 ENCOUNTER — Ambulatory Visit: Payer: Self-pay | Admitting: Nurse Practitioner

## 2018-03-05 ENCOUNTER — Ambulatory Visit (INDEPENDENT_AMBULATORY_CARE_PROVIDER_SITE_OTHER): Payer: BC Managed Care – PPO | Admitting: Bariatrics

## 2018-03-05 ENCOUNTER — Encounter (INDEPENDENT_AMBULATORY_CARE_PROVIDER_SITE_OTHER): Payer: Self-pay | Admitting: Bariatrics

## 2018-03-05 VITALS — BP 130/80 | HR 80 | Temp 97.5°F | Ht 64.0 in | Wt 225.0 lb

## 2018-03-05 DIAGNOSIS — G43809 Other migraine, not intractable, without status migrainosus: Secondary | ICD-10-CM | POA: Diagnosis not present

## 2018-03-05 DIAGNOSIS — Z6838 Body mass index (BMI) 38.0-38.9, adult: Secondary | ICD-10-CM

## 2018-03-05 DIAGNOSIS — E559 Vitamin D deficiency, unspecified: Secondary | ICD-10-CM | POA: Diagnosis not present

## 2018-03-05 DIAGNOSIS — F3289 Other specified depressive episodes: Secondary | ICD-10-CM | POA: Diagnosis not present
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
## 2018-03-07 ENCOUNTER — Ambulatory Visit: Payer: BC Managed Care – PPO | Admitting: Internal Medicine

## 2018-03-07 ENCOUNTER — Encounter: Payer: Self-pay | Admitting: Internal Medicine

## 2018-03-07 VITALS — BP 130/72 | HR 78 | Ht 64.0 in | Wt 228.8 lb

## 2018-03-07 DIAGNOSIS — Z8669 Personal history of other diseases of the nervous system and sense organs: Secondary | ICD-10-CM

## 2018-03-07 DIAGNOSIS — R0602 Shortness of breath: Secondary | ICD-10-CM

## 2018-03-07 NOTE — Progress Notes (Signed)
Patient ID: [name], female    DOB: 14-Jul-1975, 43 y.o.   MRN: [id number]  HPI    IOV 10/22/2017  Chief Complaint  Patient presents with  . Consult    States she was a hysterectomy and had a respiratory episode what she thinks was an asthma exacerbation. States she has been SOB for year with chest tightness. Has a heart monitor currently, hx of stroke and severe anxiety.  Has been having severe heart burn since being intubated. Has tried zantac but it only helps temporary.    43 year old obese female with no prior history of allergies or asthma other than the past medical history stated below.  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  She underwent hysterectomy on July 01, 2017.  According to her history and review of the records from that day shortly after intubation she developed wheezing with low tidal volumes and given albuterol and epinephrine through the tube and then responded.  Surgery and postoperative.  Subsequently was uneventful.  She has now been asked to follow-up here.  In the interim she has had a new diagnosis of obstructive sleep apnea in 1 week ago started CPAP therapy at night.  She also states that postoperatively she developed new acid reflux/heartburn for which ranitidine helps but after starting CPAP 1 week ago there is venous further significant improvement.  Nevertheless all this is new for her especially the acid reflux.  Since the surgery she has not had any episodes of wheezing or cough or orthopnea proximal nocturnal dyspnea or hemoptysis or edema.  She does report chronic stable dyspnea for many years when she climbs 1 or 2 flights of stairs or runs behind children.  She believes this is due to deconditioning and obesity.  This is unchanged pre-and postoperatively.  She is very perplexed by the preoperative but post intubation wheezing   Exam nitric oxide test here is normal  Walking desaturation test 185 feet x 3 laps on room  air: Resting heart rate was 75.  Final heart rate was 112/min.  Resting pulse ox 98% and final pulse ox was 100%.  OV 03/07/2018  Subjective:  Patient ID: [name], female , DOB: 1975-09-01 , age 53 y.o. , MRN: [id number] , ADDRESS: [place] [address]   03/07/2018 -   Chief Complaint  Patient presents with  . other    Was told that during surgical procedure it looked like she was having a breathing episode that needed to be checked by pulmonary.     HPI [name] 43 y.o. - Presents for unexplained dyspnea that happened acutely during surgery postoperatively in summer 2019.  Since then she is not had any problems.  When she saw me we did a chest x-ray blood work and this was normal.  I sent her for pulmonary stress test which revealed that obesity and deconditioning is reasons for dyspnea.  However she did drop blood pressure.  Referred her to cardiology.  She says she saw cardiologist in [facility].  She he did a Holter monitor on her for 1 month and it is normal.  She was referred to Dr. Caryl Comes in February 2020 electrophysiologist but it was decided not to do a loop recorder.  Her neurologist agreed with her.  At this point in time she has lost weight.  She is started CPAP for sleep apnea.  Of note prior to the surgery she was  not using CPAP.  And she is feeling better.  She is dealing with significant migraines and social stressors.  Her eldest adopted child is giving her significant problems.  He is stealing etc.  Mental health and social services are involved in this.  Therefore she is having significant migraines.     ROS - per HPI     has a past medical history of Anemia (2019), Anxiety, Back pain, Bronchospasm, Celiac disease, Chronic tonsillitis, Depression, Fibroid, GERD (gastroesophageal reflux disease), Headache(784.0), High cholesterol, Hives, Hypertension, Migraine, Migraines, Pre-diabetes, Rape, Sleep apnea, and Stroke (Zinc).   reports  that she has never smoked. She has never used smokeless tobacco.  Past Surgical History:  Procedure Laterality Date  . CYSTOSCOPY N/A 07/01/2017   Procedure: CYSTOSCOPY;  Surgeon: Megan Salon, MD;  Location: [facility];  Service: Gynecology;  Laterality: N/A;  possible cysto  . LAPAROSCOPIC BILATERAL SALPINGECTOMY Bilateral 07/01/2017   Procedure: LAPAROSCOPIC BILATERAL SALPINGECTOMY;  Surgeon: Megan Salon, MD;  Location: [facility];  Service: Gynecology;  Laterality: Bilateral;  . LAPAROSCOPIC HYSTERECTOMY N/A 07/01/2017   Procedure: HYSTERECTOMY TOTAL LAPAROSCOPIC;  Surgeon: Megan Salon, MD;  Location: [facility];  Service: Gynecology;  Laterality: N/A;  . PILONIDAL CYST EXCISION     x2  . TEE WITHOUT CARDIOVERSION N/A 03/26/2016   Procedure: TRANSESOPHAGEAL ECHOCARDIOGRAM (TEE);  Surgeon: Lelon Perla, MD;  Location: [facility] END[facility]COPY;  Service: Cardiovascular;  Laterality: N/A;  . TONSILLECTOMY  07/16/2011   Procedure: TONSILLECTOMY;  Surgeon: Izora Gala, MD;  Location: [place];  Service: ENT;  Laterality: N/A;  . WISDOM TOOTH EXTRACTION      Allergies  Allergen Reactions  . Gluten Meal     Other  . Hydrocodone     Unsure if true allergy    Immunization History  Administered Date(s) Administered  . Influenza,inj,Quad PF,6+ Mos 10/15/2013  . Influenza-Unspecified 10/21/2017  . Tdap 04/18/2007, 01/16/2011  . Tetanus 01/15/2002    Family History  Problem Relation Age of Onset  . Thyroid disease Mother   . Heart attack Mother   . Stroke Mother   . Kidney cancer Mother   . Endometrial cancer Mother        diag 11/2013  . Diabetes Mother   . Obesity Mother   . High blood pressure Mother   . Thyroid disease Father   . Stroke Father   . Cancer Father        prostate  . Diabetes Father   . Heart disease Father   . Obesity Father   . Thyroid disease Brother   . Hypertension Brother   . Diabetes  Brother   . Stroke Maternal Grandmother   . Diabetes Maternal Grandmother   . Cancer Maternal Grandmother        kidney & blood cancer  . Stroke Maternal Grandfather   . Diabetes Paternal Grandmother   . Dementia Paternal Grandmother   . Diabetes Paternal Grandfather   . Heart attack Paternal Grandfather   . Cancer Maternal Aunt        Sarcoidosis     Current Outpatient Medications:  .  ALPRAZolam (XANAX) 0.5 MG tablet, Take 1 tablet (0.5 mg total) by mouth at bedtime as needed for anxiety., Disp: 30 tablet, Rfl: 0 .  atorvastatin (LIPITOR) 10 MG tablet, Take 1 tablet (10 mg total) by mouth at bedtime., Disp: 90 tablet, Rfl: 4 .  buPROPion (WELLBUTRIN SR) 150 MG 12 hr tablet, Take  1 tablet (150 mg total) by mouth 2 (two) times daily., Disp: 180 tablet, Rfl: 4 .  Diclofenac Potassium (CAMBIA) 50 MG PACK, Take 50 mg by mouth daily as needed., Disp: 15 each, Rfl: 11 .  FLUoxetine (PROZAC) 40 MG capsule, Take 1 capsule (40 mg total) by mouth daily., Disp: 90 capsule, Rfl: 4 .  flurbiprofen (ANSAID) 100 MG tablet, Take 100 mg by mouth 2 (two) times daily as needed (headaches). , Disp: , Rfl:  .  Fremanezumab-vfrm (AJOVY) 225 MG/1.5ML SOSY, Inject 225 mg into the skin every 30 (thirty) days., Disp: 1 Syringe, Rfl: 11 .  Fremanezumab-vfrm (AJOVY) 225 MG/1.5ML SOSY, Inject 225 mg into the skin every 30 (thirty) days., Disp: 3 Syringe, Rfl: 0 .  hydrOXYzine (ATARAX/VISTARIL) 10 MG tablet, Take 10 mg by mouth as needed for anxiety. , Disp: , Rfl:  .  MELATONIN GUMMIES PO, Take 1 tablet by mouth as needed (sleep). , Disp: , Rfl:  .  ondansetron (ZOFRAN ODT) 4 MG disintegrating tablet, Take 1 tablet (4 mg total) by mouth every 8 (eight) hours as needed., Disp: 20 tablet, Rfl: 11 .  Ubrogepant (UBRELVY) 100 MG TABS, Take 100 mg by mouth daily as needed. Take one tablet at onset of headache, may repeat 1 tablet in 2 hours, no more than 2 tablets in 24 hours., Disp: 10 tablet, Rfl: 11 .  Vitamin D,  Ergocalciferol, (DRISDOL) 1.25 MG (50000 UT) CAPS capsule, Take 1 capsule (50,000 Units total) by mouth every 7 (seven) days., Disp: 4 capsule, Rfl: 0 .  zonisamide (ZONEGRAN) 100 MG capsule, Take 2 capsules (200 mg total) by mouth at bedtime., Disp: 180 capsule, Rfl: 4      Objective:   Vitals:   03/07/18 1055  BP: 130/72  Pulse: 78  SpO2: 98%  Weight: 228 lb 12.8 oz (103.8 kg)  Height: 5' 4"  (1.626 m)    Estimated body mass index is 39.27 kg/m as calculated from the following:   Height as of this encounter: 5' 4"  (1.626 m).   Weight as of this encounter: 228 lb 12.8 oz (103.8 kg).  @WEIGHTCHANGE @  [employer]   03/07/18 1055  Weight: 228 lb 12.8 oz (103.8 kg)     Physical Exam  General Appearance:    Alert, cooperative, no distress, appears stated age - yes , Deconditioned looking - no , OBESE  - yes, Sitting on Wheelchair -  no  Head:    Normocephalic, without obvious abnormality, atraumatic  Eyes:    PERRL, conjunctiva/corneas clear,  Ears:    Normal TM's and external ear canals, both ears  Nose:   Nares normal, septum midline, mucosa normal, no drainage    or sinus tenderness. OXYGEN ON  - no . Patient is @ ra   Throat:   Lips, mucosa, and tongue normal; teeth and gums normal. Cyanosis on lips - no  Neck:   Supple, symmetrical, trachea midline, no adenopathy;    thyroid:  no enlargement/tenderness/nodules; no carotid   bruit or JVD  Back:     Symmetric, no curvature, ROM normal, no CVA tenderness  Lungs:     Distress - no , Wheeze no, Barrell Chest - no, Purse lip breathing - no, Crackles - no   Chest Wall:    No tenderness or deformity.    Heart:    Regular rate and rhythm, S1 and S2 normal, no rub   or gallop, Murmur - no  Breast Exam:    NOT DONE  Abdomen:     Soft, non-tender, bowel sounds active all four quadrants,    no masses, no organomegaly. Visceral obesity - yes  Genitalia:   NOT DONE  Rectal:   NOT DONE  Extremities:   Extremities - normal, Has  Cane - no, Clubbing - no, Edema - no  Pulses:   2+ and symmetric all extremities  Skin:   Stigmata of Connective Tissue Disease - n  Lymph nodes:   Cervical, supraclavicular, and axillary nodes normal  Psychiatric:  Neurologic:   Pleasant - yes, Anxious - no, Flat affect - no  CAm-ICU - neg, Alert and Oriented x 3 - yes, Moves all 4s - yes, Speech - normal, Cognition - intact           Assessment:       ICD-10-CM   1. Shortness of breath on exertion R06.02   2. History of obstructive sleep apnea Z86.69   3. History of migraine Z86.69        Plan:     Patient Instructions     ICD-10-CM   1. Shortness of breath on exertion R06.02   2. History of obstructive sleep apnea Z86.69    Episode close surgery remains unexplained but I wonder if it was related to untreated sleep apnea  In any event glad you are better and losing weight  We will adopt an expectant approach  Continue using your CPAP   Migraine hx -Please check out tinted sunglasses from some axonal optics.  My ophthalmologist told me that it is helpful.  I do not have personal experience with this.  Follow-up - 1 1 year or sooner if needed   (> 50% of this 15 min visit spent in face to face counseling or/and coordination of care by this undersigned MD - Dr Brand Males. This includes one or more of the following documented above: discussion of test results, diagnostic or treatment recommendations, prognosis, risks and benefits of management options, instructions, education, compliance or risk-factor reduction) Who is next after that  SIGNATURE    Dr. Brand Males, M.D., F.C.C.P,  Pulmonary and Critical Care Medicine Staff Physician, [facility] Director - Interstitial Lung Disease  Program  [facility] [place], [place], [address]  Pager: [phone], If no answer or between  15:00h - 7:00h: call [phone] Telephone: [phone]  11:48 AM 03/07/2018

## 2018-03-07 NOTE — Patient Instructions (Addendum)
ICD-10-CM   1. Shortness of breath on exertion R06.02   2. History of obstructive sleep apnea Z86.69    Episode close surgery remains unexplained but I wonder if it was related to untreated sleep apnea  In any event glad you are better and losing weight  We will adopt an expectant approach  Continue using your CPAP   Migraine hx -Please check out tinted sunglasses from some axonal optics.  My ophthalmologist told me that it is helpful.  I do not have personal experience with this.  Follow-up - 1 1 year or sooner if needed

## 2018-03-18 ENCOUNTER — Other Ambulatory Visit: Payer: Self-pay | Admitting: Obstetrics & Gynecology

## 2018-03-18 MED ORDER — ALPRAZOLAM 0.5 MG PO TABS: 0.5000 mg | ORAL_TABLET | Freq: Every evening | ORAL | 0 refills | Status: DC | PRN

## 2018-03-20 ENCOUNTER — Encounter (INDEPENDENT_AMBULATORY_CARE_PROVIDER_SITE_OTHER): Payer: Self-pay | Admitting: Bariatrics

## 2018-03-20 ENCOUNTER — Ambulatory Visit (INDEPENDENT_AMBULATORY_CARE_PROVIDER_SITE_OTHER): Payer: BC Managed Care – PPO | Admitting: Psychology

## 2018-03-20 ENCOUNTER — Ambulatory Visit (INDEPENDENT_AMBULATORY_CARE_PROVIDER_SITE_OTHER): Payer: BC Managed Care – PPO | Admitting: Bariatrics

## 2018-03-20 VITALS — BP 138/82 | HR 74 | Temp 97.7°F | Ht 64.0 in | Wt 219.0 lb

## 2018-03-20 DIAGNOSIS — Z9189 Other specified personal risk factors, not elsewhere classified: Secondary | ICD-10-CM | POA: Diagnosis not present

## 2018-03-20 DIAGNOSIS — E559 Vitamin D deficiency, unspecified: Secondary | ICD-10-CM

## 2018-03-20 DIAGNOSIS — F3289 Other specified depressive episodes: Secondary | ICD-10-CM

## 2018-03-20 DIAGNOSIS — Z6836 Body mass index (BMI) 36.0-36.9, adult: Secondary | ICD-10-CM

## 2018-03-20 MED ORDER — VITAMIN D (ERGOCALCIFEROL) 1.25 MG (50000 UNIT) PO CAPS: 50000.0000 [IU] | ORAL_CAPSULE | ORAL | 0 refills | Status: DC
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
## 2018-03-25 DIAGNOSIS — E559 Vitamin D deficiency, unspecified: Secondary | ICD-10-CM | POA: Insufficient documentation

## 2018-03-26 ENCOUNTER — Telehealth: Payer: Self-pay

## 2018-03-26 NOTE — Telephone Encounter (Signed)
Pending approval for Ubrelvy 100 mg tablets Key: [id number] Rx #: [id number] ICD 10 code: P95.093  I will update once a determination has been made.

## 2018-03-27 NOTE — Telephone Encounter (Signed)
Received a approval letter from [facility] for Ubrelvy 100 mg. Approved from 03/26/2018-03/26/2019.

## 2018-04-07 ENCOUNTER — Encounter (INDEPENDENT_AMBULATORY_CARE_PROVIDER_SITE_OTHER): Payer: Self-pay

## 2018-04-08 ENCOUNTER — Ambulatory Visit (INDEPENDENT_AMBULATORY_CARE_PROVIDER_SITE_OTHER): Payer: BC Managed Care – PPO | Admitting: Bariatrics

## 2018-04-08 ENCOUNTER — Other Ambulatory Visit: Payer: Self-pay

## 2018-04-08 ENCOUNTER — Encounter (INDEPENDENT_AMBULATORY_CARE_PROVIDER_SITE_OTHER): Payer: Self-pay | Admitting: Bariatrics

## 2018-04-08 ENCOUNTER — Encounter (INDEPENDENT_AMBULATORY_CARE_PROVIDER_SITE_OTHER): Payer: Self-pay

## 2018-04-08 DIAGNOSIS — F3289 Other specified depressive episodes: Secondary | ICD-10-CM | POA: Diagnosis not present

## 2018-04-08 DIAGNOSIS — Z6841 Body Mass Index (BMI) 40.0 and over, adult: Secondary | ICD-10-CM | POA: Diagnosis not present

## 2018-04-08 DIAGNOSIS — E66813 Obesity, class 3: Secondary | ICD-10-CM

## 2018-04-08 DIAGNOSIS — E559 Vitamin D deficiency, unspecified: Secondary | ICD-10-CM | POA: Diagnosis not present

## 2018-04-08 MED ORDER — VITAMIN D (ERGOCALCIFEROL) 1.25 MG (50000 UNIT) PO CAPS: 50000.0000 [IU] | ORAL_CAPSULE | ORAL | 0 refills | Status: DC
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
## 2018-04-23 ENCOUNTER — Other Ambulatory Visit: Payer: Self-pay

## 2018-04-23 ENCOUNTER — Ambulatory Visit (INDEPENDENT_AMBULATORY_CARE_PROVIDER_SITE_OTHER): Payer: BC Managed Care – PPO | Admitting: Bariatrics

## 2018-04-23 ENCOUNTER — Encounter (INDEPENDENT_AMBULATORY_CARE_PROVIDER_SITE_OTHER): Payer: Self-pay | Admitting: Bariatrics

## 2018-04-23 DIAGNOSIS — F3289 Other specified depressive episodes: Secondary | ICD-10-CM | POA: Diagnosis not present

## 2018-04-23 DIAGNOSIS — E559 Vitamin D deficiency, unspecified: Secondary | ICD-10-CM

## 2018-04-23 DIAGNOSIS — Z6837 Body mass index (BMI) 37.0-37.9, adult: Secondary | ICD-10-CM | POA: Diagnosis not present
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
## 2018-04-27 ENCOUNTER — Other Ambulatory Visit: Payer: Self-pay | Admitting: Family Medicine

## 2018-05-07 ENCOUNTER — Encounter (INDEPENDENT_AMBULATORY_CARE_PROVIDER_SITE_OTHER): Payer: Self-pay | Admitting: Bariatrics

## 2018-05-07 ENCOUNTER — Ambulatory Visit (INDEPENDENT_AMBULATORY_CARE_PROVIDER_SITE_OTHER): Payer: BC Managed Care – PPO | Admitting: Bariatrics

## 2018-05-07 ENCOUNTER — Other Ambulatory Visit: Payer: Self-pay

## 2018-05-07 DIAGNOSIS — E559 Vitamin D deficiency, unspecified: Secondary | ICD-10-CM

## 2018-05-07 DIAGNOSIS — F3289 Other specified depressive episodes: Secondary | ICD-10-CM

## 2018-05-07 DIAGNOSIS — Z6837 Body mass index (BMI) 37.0-37.9, adult: Secondary | ICD-10-CM | POA: Diagnosis not present
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
## 2018-05-21 ENCOUNTER — Encounter (INDEPENDENT_AMBULATORY_CARE_PROVIDER_SITE_OTHER): Payer: Self-pay | Admitting: Bariatrics

## 2018-05-21 ENCOUNTER — Ambulatory Visit (INDEPENDENT_AMBULATORY_CARE_PROVIDER_SITE_OTHER): Payer: BC Managed Care – PPO | Admitting: Bariatrics

## 2018-05-21 ENCOUNTER — Other Ambulatory Visit: Payer: Self-pay

## 2018-05-21 DIAGNOSIS — Z6837 Body mass index (BMI) 37.0-37.9, adult: Secondary | ICD-10-CM | POA: Diagnosis not present

## 2018-05-21 DIAGNOSIS — F3289 Other specified depressive episodes: Secondary | ICD-10-CM

## 2018-05-21 DIAGNOSIS — E559 Vitamin D deficiency, unspecified: Secondary | ICD-10-CM | POA: Diagnosis not present
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
## 2018-05-30 ENCOUNTER — Encounter (INDEPENDENT_AMBULATORY_CARE_PROVIDER_SITE_OTHER): Payer: Self-pay | Admitting: Bariatrics

## 2018-06-02 NOTE — Telephone Encounter (Signed)
Please advise 

## 2018-06-03 ENCOUNTER — Encounter (INDEPENDENT_AMBULATORY_CARE_PROVIDER_SITE_OTHER): Payer: Self-pay | Admitting: Bariatrics

## 2018-06-03 ENCOUNTER — Other Ambulatory Visit: Payer: Self-pay

## 2018-06-03 ENCOUNTER — Ambulatory Visit (INDEPENDENT_AMBULATORY_CARE_PROVIDER_SITE_OTHER): Payer: BC Managed Care – PPO | Admitting: Bariatrics

## 2018-06-03 DIAGNOSIS — F3289 Other specified depressive episodes: Secondary | ICD-10-CM | POA: Diagnosis not present

## 2018-06-03 DIAGNOSIS — Z6837 Body mass index (BMI) 37.0-37.9, adult: Secondary | ICD-10-CM | POA: Diagnosis not present

## 2018-06-03 DIAGNOSIS — E559 Vitamin D deficiency, unspecified: Secondary | ICD-10-CM | POA: Diagnosis not present

## 2018-06-03 MED ORDER — VITAMIN D (ERGOCALCIFEROL) 1.25 MG (50000 UNIT) PO CAPS: 50000.0000 [IU] | ORAL_CAPSULE | ORAL | 0 refills | Status: DC

## 2018-06-04 ENCOUNTER — Ambulatory Visit (INDEPENDENT_AMBULATORY_CARE_PROVIDER_SITE_OTHER): Payer: Self-pay | Admitting: Bariatrics
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
## 2018-06-17 ENCOUNTER — Other Ambulatory Visit: Payer: Self-pay

## 2018-06-17 ENCOUNTER — Encounter (INDEPENDENT_AMBULATORY_CARE_PROVIDER_SITE_OTHER): Payer: Self-pay | Admitting: Bariatrics

## 2018-06-17 ENCOUNTER — Ambulatory Visit (INDEPENDENT_AMBULATORY_CARE_PROVIDER_SITE_OTHER): Payer: BC Managed Care – PPO | Admitting: Bariatrics

## 2018-06-17 DIAGNOSIS — E559 Vitamin D deficiency, unspecified: Secondary | ICD-10-CM

## 2018-06-17 DIAGNOSIS — E7849 Other hyperlipidemia: Secondary | ICD-10-CM

## 2018-06-17 DIAGNOSIS — Z6837 Body mass index (BMI) 37.0-37.9, adult: Secondary | ICD-10-CM
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
## 2018-07-02 ENCOUNTER — Other Ambulatory Visit: Payer: Self-pay

## 2018-07-02 ENCOUNTER — Encounter (INDEPENDENT_AMBULATORY_CARE_PROVIDER_SITE_OTHER): Payer: Self-pay | Admitting: Bariatrics

## 2018-07-02 ENCOUNTER — Ambulatory Visit (INDEPENDENT_AMBULATORY_CARE_PROVIDER_SITE_OTHER): Payer: BC Managed Care – PPO | Admitting: Bariatrics

## 2018-07-02 VITALS — BP 128/85 | HR 81 | Temp 97.9°F | Ht 64.0 in | Wt 239.0 lb

## 2018-07-02 DIAGNOSIS — F3289 Other specified depressive episodes: Secondary | ICD-10-CM

## 2018-07-02 DIAGNOSIS — Z9189 Other specified personal risk factors, not elsewhere classified: Secondary | ICD-10-CM | POA: Diagnosis not present

## 2018-07-02 DIAGNOSIS — E7849 Other hyperlipidemia: Secondary | ICD-10-CM | POA: Diagnosis not present

## 2018-07-02 DIAGNOSIS — E559 Vitamin D deficiency, unspecified: Secondary | ICD-10-CM | POA: Diagnosis not present

## 2018-07-02 DIAGNOSIS — Z6841 Body Mass Index (BMI) 40.0 and over, adult: Secondary | ICD-10-CM

## 2018-07-03 LAB — LIPID PANEL WITH LDL/HDL RATIO
Cholesterol, Total: 171 mg/dL (ref 100–199)
HDL: 51 mg/dL (ref >39)
LDL Calculated: 104 mg/dL — ABNORMAL HIGH (ref 0–99)
LDl/HDL Ratio: 2 ratio (ref 0.0–3.2)
Triglycerides: 82 mg/dL (ref 0–149)
VLDL Cholesterol Cal: 16 mg/dL (ref 5–40)

## 2018-07-03 LAB — COMPREHENSIVE METABOLIC PANEL
ALT: 48 IU/L — ABNORMAL HIGH (ref 0–32)
AST: 29 IU/L (ref 0–40)
Albumin/Globulin Ratio: 1.8 (ref 1.2–2.2)
Albumin: 4 g/dL (ref 3.8–4.8)
Alkaline Phosphatase: 69 IU/L (ref 39–117)
BUN/Creatinine Ratio: 19 (ref 9–23)
BUN: 12 mg/dL (ref 6–24)
Bilirubin Total: 0.6 mg/dL (ref 0.0–1.2)
CO2: 26 mmol/L (ref 20–29)
Calcium: 9.1 mg/dL (ref 8.7–10.2)
Chloride: 102 mmol/L (ref 96–106)
Creatinine, Ser: 0.63 mg/dL (ref 0.57–1.00)
GFR calc Af Amer: 129 mL/min/{1.73_m2} (ref >59)
GFR calc non Af Amer: 112 mL/min/{1.73_m2} (ref >59)
Globulin, Total: 2.2 g/dL (ref 1.5–4.5)
Glucose: 78 mg/dL (ref 65–99)
Potassium: 4.4 mmol/L (ref 3.5–5.2)
Sodium: 141 mmol/L (ref 134–144)
Total Protein: 6.2 g/dL (ref 6.0–8.5)

## 2018-07-03 LAB — T3: T3, Total: 111 ng/dL (ref 71–180)

## 2018-07-03 LAB — INSULIN, RANDOM: INSULIN: 6.9 u[IU]/mL (ref 2.6–24.9)

## 2018-07-03 LAB — T4, FREE: Free T4: 1.1 ng/dL (ref 0.82–1.77)

## 2018-07-03 LAB — HEMOGLOBIN A1C
Est. average glucose Bld gHb Est-mCnc: 105 mg/dL
Hgb A1c MFr Bld: 5.3 % (ref 4.8–5.6)

## 2018-07-03 LAB — VITAMIN D 25 HYDROXY (VIT D DEFICIENCY, FRACTURES): Vit D, 25-Hydroxy: 20 ng/mL — ABNORMAL LOW (ref 30.0–100.0)

## 2018-07-03 LAB — TSH: TSH: 2.45 u[IU]/mL (ref 0.450–4.500)
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
## 2018-07-13 ENCOUNTER — Encounter (INDEPENDENT_AMBULATORY_CARE_PROVIDER_SITE_OTHER): Payer: Self-pay | Admitting: Bariatrics

## 2018-07-16 ENCOUNTER — Ambulatory Visit (INDEPENDENT_AMBULATORY_CARE_PROVIDER_SITE_OTHER): Payer: BC Managed Care – PPO | Admitting: Bariatrics

## 2018-08-18 ENCOUNTER — Encounter: Payer: Self-pay | Admitting: Family Medicine

## 2018-08-18 ENCOUNTER — Ambulatory Visit: Payer: BC Managed Care – PPO | Admitting: Obstetrics & Gynecology

## 2018-08-18 ENCOUNTER — Other Ambulatory Visit: Payer: Self-pay

## 2018-08-18 ENCOUNTER — Encounter: Payer: Self-pay | Admitting: Obstetrics & Gynecology

## 2018-08-18 VITALS — BP 110/70 | HR 84 | Temp 97.0°F | Ht 63.5 in | Wt 247.8 lb

## 2018-08-18 DIAGNOSIS — Z01419 Encounter for gynecological examination (general) (routine) without abnormal findings: Secondary | ICD-10-CM
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
## 2018-08-19 ENCOUNTER — Encounter: Payer: Self-pay | Admitting: Family Medicine

## 2018-08-19 ENCOUNTER — Telehealth (INDEPENDENT_AMBULATORY_CARE_PROVIDER_SITE_OTHER): Payer: BC Managed Care – PPO | Admitting: Family Medicine

## 2018-08-19 DIAGNOSIS — G4733 Obstructive sleep apnea (adult) (pediatric): Secondary | ICD-10-CM

## 2018-08-19 DIAGNOSIS — Z8673 Personal history of transient ischemic attack (TIA), and cerebral infarction without residual deficits: Secondary | ICD-10-CM | POA: Diagnosis not present

## 2018-08-19 DIAGNOSIS — F419 Anxiety disorder, unspecified: Secondary | ICD-10-CM | POA: Diagnosis not present

## 2018-08-19 DIAGNOSIS — G43109 Migraine with aura, not intractable, without status migrainosus: Secondary | ICD-10-CM

## 2018-08-19 NOTE — Progress Notes (Signed)
PATIENT: [name] DOB: 1975-07-03  REASON FOR VISIT: follow up HISTORY FROM: patient  Virtual Visit via Telephone Note  I connected with [name] on 08/19/18 at  9:00 AM EDT by telephone and verified that I am speaking with the correct person using two identifiers.   I discussed the limitations, risks, security and privacy concerns of performing an evaluation and management service by telephone and the availability of in person appointments. I also discussed with the patient that there may be a patient responsible charge related to this service. The patient expressed understanding and agreed to proceed.   History of Present Illness:  08/19/18 [name] is a 43 y.o. female here today for follow up for migraines. She continues Zonegran 244m daily and Ajovy monthly for prevention. She did note improvement of her migraines after starting Ajovy. Unfortunately, over the past few weeks, she has noticed that they have returned. She has noticed worsening of fogginess and memory concerns for about a year. She was having an eye exam about two weeks ago and was asked by the provider if she was having memory concerns. Once she confirmed he asked her to talk with [place] as this was a common side effect. She has taken this for several years with noted improvement of migraines. 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 She continues to suffer from increased stress, anxiety and depression as her adopted son is suffering from drug abuse. She is working with psychiatry for treatment of anxiety and depression. She is taking Prozac 631mdaily, Wellbutrin 30036maily, and Atarax 34m44m needed. She was recently started on gabapentin 100mg3mce daily for anxiety. She is seen by her therapist twice weekly. 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 She follows with PCP closely for wellness and HLD treatment. She does not  exercise. She uses CPAP therapy for OSA.   History (copied from my note on 02/27/2018)  EllenOCEANE [name] 42 y.68 female here today for follow up. Headaches usually start in the occipital region (left or right). She describes a pounding/throbbing sensation. She is nauseated with headaches and is sensitive to light, sound and odor. She does occasionally see flashing lights before the headache starts. Dark room and pressure seems to help. Stress worsens. She is taking Zonegran 200mg 53my. She uses Ansaid about 4 times a week. Prior to worsening in the past two weeks she was not having to take Nsaid but once a week or so. She takes melatonin, Prozac and Wellbutrin. She admits that she has more stress at home right now with her adopted son who is struggling with drug abuse. She is participating with [facility] and has lost 25 pounds since the end of September. She cut out caffeine in December.   She is using CPAP nightly. She was started in October but was not able to use consistently until December 2019.  The past 30-day download compliance report reveals that she is using her CPAP 27 out of 30 days for compliance of 90%.  She is using CPAP for greater than 4 hours 23 of the 30 days for compliance of 77%.  On average she uses her CPAP 5 hours and 48 minutes.  Her AHI is 0.7 on 4 to 8 cm of water with a EPR of 3.  There is no significant leak.  Good benefit from using CPAP regularly.  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 She has had a lot of stress. Still having the migraines. She has daily headaches. 10 migraine days a month. She had a sleep study done May 11. The sleep study shows sleep apnea. She had a monitoring time of 5 hour 41 minutes. With an AHI 14.1. Also obesity  HPI: [name] a 43 years old female, seen in refer byher primary care PA [name],  Brittany,for evaluation of chronic migraine headache initial evaluation was on November 19 2016.  I reviewed and summarized the referring note, she had a history of iron deficiency anemia, obesity, obstructive sleep apnea,  She reported history of migraine headaches since age 51, her typical migraine left lateralized severe pounding headache with associated light noise sensitivity, first headache she reported last more than one year, later on she have intermittent migraine headaches, about 4 days out of a week she would suffer moderate to severe migraine headaches, lying dark quiet room was helpful, she has tried different triptan seen in the past with limited help,  She was under the neurologistDr.Lewettcare, I was able to review office visit, the most recent wason April 25 2016,patientcomplains headaches on a daily basis,on average in amount since then, she has 3 severe migraine headaches, 3 moderate headaches, she was given Zonegran 25 mg 4 tablets every night as preventative medications, patient complains of significant side effect with Topamax in the past, numbness of bilateral upper extremity, mental confusion,  She has visual aura sometimes with her typical migraines, flashing light in her visual field lasting for a few minutes before the onset of severe headaches,  For abortive treatment, she was given Phenergan, and NSAIDs as needed with limited help,  Trigger for her migraines are stress, hungry, exertion, weather changes,  She was recently diagnosed with iron deficiency anemia, ferritin level was 6, received IV iron infusion twice since October 2018, since then, her migraine has changed, instead of starting at the left occipital region, it often started at the right side, spreading forward, continue moderate severe headaches with limited help from NSAIDs and Phenergan,  In addition,MRI of the brainin February 2018showedsmallright cerebellum infarction,she had  extensive evaluations  Laboratory evaluations: CBC showed hemoglobin of 11.3, decreased MCV 71, ESR 19, negativeornormal anticardiolipin antibody, RPR, ANA, LDL 103, cholesterol 162, normal CMP, homocystine, antithrombin III, protein C, S, negative factor V Adella Nissen April 2018was reported normal,no PFO  EKG showed heart rhythm of 59, no acute abnormality,\she was advised to take baby aspirin every day,  MRI of cervical spine showed multilevel mild degenerative changes there was no significant canal or foraminal narrowing.  Observations/Objective:  Generalized: Well developed, in no acute distress  Mentation: Alert oriented to time, place, history taking. Follows all commands speech and language fluent   Assessment and Plan:  43 y.o. year old female  has a past medical history of Anemia (2019), Anxiety, Back pain, Bronchospasm, Celiac disease, Chronic tonsillitis, Depression, Fibroid, GERD (gastroesophageal reflux disease), Headache(784.0), High cholesterol, Hives, Hypertension, Migraine, Migraines, Pre-diabetes, Rape, Sleep apnea, and Stroke ([place]). here with    ICD-10-CM   1. Migraine with aura and without status migrainosus, not intractable  G43.109   2. OSA (obstructive sleep apnea)  G47.33   3. History of stroke involving cerebellum  Z86.73   4. Anxiety  F41.9    Antony Haste does feel noted benefit in migraine frequency and intensity with Zonegran 200 mg twice daily and  Ajovy monthly.  Memory loss has been present for approximately 1 year.  We have discussed multiple considerations that could be contributing to memory concerns including sleep apnea, increased stress and migraines.  After discussion, she wishes to continue medications as prescribed.  We will continue current therapy.  She will continue close follow-up with psychiatry and psychology for stress management.  I have also advised that she follow-up closely with PCP for hyperlipidemia and wellness.  I have encouraged  her to add an exercise regimen to her daily routine.  She was encouraged to start with a small call and increase time each week.  We will follow-up in 3 months and assess symptoms at that time.  May consider weaning Zonegran if symptoms persist.  Appointment was scheduled via my chart in November.  She verbalizes understanding and agreement with this plan.   No orders of the defined types were placed in this encounter.   No orders of the defined types were placed in this encounter.    Follow Up Instructions:  I discussed the assessment and treatment plan with the patient. The patient was provided an opportunity to ask questions and all were answered. The patient agreed with the plan and demonstrated an understanding of the instructions.   The patient was advised to call back or seek an in-person evaluation if the symptoms worsen or if the condition fails to improve as anticipated.  I provided 25 minutes of non-face-to-face time during this encounter.  Patient is located at her place of residence during video conference.  Provider is located in the office.   Debbora Presto, NP

## 2018-08-20 NOTE — Progress Notes (Signed)
Made any corrections needed, and agree with history, physical, neuro exam,assessment and plan as stated.     Antonia Ahern, MD [facility]  

## 2018-08-26 ENCOUNTER — Encounter: Payer: Self-pay | Admitting: Family Medicine

## 2018-08-26 ENCOUNTER — Telehealth: Payer: Self-pay | Admitting: *Deleted

## 2018-08-26 NOTE — Telephone Encounter (Signed)
Called patient re: Colonoscopy report.  Informed patient [name] does not have her registered as a patient.   States she doesn't remember where she got it done. States she will look around her house and see if she can find information. She will call back.   Dr. Lestine Box Encounter closed.

## 2018-08-28 ENCOUNTER — Ambulatory Visit: Payer: BC Managed Care – PPO | Admitting: Family Medicine

## 2018-09-04 ENCOUNTER — Encounter (HOSPITAL_BASED_OUTPATIENT_CLINIC_OR_DEPARTMENT_OTHER): Payer: Self-pay | Admitting: *Deleted

## 2018-09-04 ENCOUNTER — Other Ambulatory Visit: Payer: Self-pay

## 2018-09-04 ENCOUNTER — Emergency Department (HOSPITAL_BASED_OUTPATIENT_CLINIC_OR_DEPARTMENT_OTHER): Payer: BC Managed Care – PPO

## 2018-09-04 ENCOUNTER — Emergency Department (HOSPITAL_BASED_OUTPATIENT_CLINIC_OR_DEPARTMENT_OTHER)
Admission: EM | Admit: 2018-09-04 | Discharge: 2018-09-05 | Disposition: A | Discharge status: Home / Self Care | Payer: BC Managed Care – PPO | Attending: Emergency Medicine | Admitting: Emergency Medicine

## 2018-09-04 DIAGNOSIS — Z20828 Contact with and (suspected) exposure to other viral communicable diseases: Secondary | ICD-10-CM | POA: Diagnosis not present

## 2018-09-04 DIAGNOSIS — E669 Obesity, unspecified: Secondary | ICD-10-CM | POA: Insufficient documentation

## 2018-09-04 DIAGNOSIS — Z79899 Other long term (current) drug therapy: Secondary | ICD-10-CM | POA: Insufficient documentation

## 2018-09-04 DIAGNOSIS — Z6841 Body Mass Index (BMI) 40.0 and over, adult: Secondary | ICD-10-CM | POA: Insufficient documentation

## 2018-09-04 DIAGNOSIS — R112 Nausea with vomiting, unspecified: Secondary | ICD-10-CM | POA: Insufficient documentation

## 2018-09-04 DIAGNOSIS — R1031 Right lower quadrant pain: Secondary | ICD-10-CM

## 2018-09-04 DIAGNOSIS — I10 Essential (primary) hypertension: Secondary | ICD-10-CM | POA: Insufficient documentation

## 2018-09-04 DIAGNOSIS — Z8673 Personal history of transient ischemic attack (TIA), and cerebral infarction without residual deficits: Secondary | ICD-10-CM | POA: Insufficient documentation

## 2018-09-04 HISTORY — DX: Morbid (severe) obesity due to excess calories: E66.01

## 2018-09-04 LAB — COMPREHENSIVE METABOLIC PANEL
ALT: 26 U/L (ref 0–44)
AST: 24 U/L (ref 15–41)
Albumin: 4.3 g/dL (ref 3.5–5.0)
Alkaline Phosphatase: 66 U/L (ref 38–126)
Anion gap: 11 (ref 5–15)
BUN: 16 mg/dL (ref 6–20)
CO2: 23 mmol/L (ref 22–32)
Calcium: 9 mg/dL (ref 8.9–10.3)
Chloride: 101 mmol/L (ref 98–111)
Creatinine, Ser: 0.59 mg/dL (ref 0.44–1.00)
GFR calc Af Amer: >60 mL/min (ref >60)
GFR calc non Af Amer: >60 mL/min (ref >60)
Glucose, Bld: 92 mg/dL (ref 70–99)
Potassium: 4.1 mmol/L (ref 3.5–5.1)
Sodium: 135 mmol/L (ref 135–145)
Total Bilirubin: 0.6 mg/dL (ref 0.3–1.2)
Total Protein: 7.5 g/dL (ref 6.5–8.1)

## 2018-09-04 LAB — WET PREP, GENITAL
Sperm: NONE SEEN
Trich, Wet Prep: NONE SEEN
WBC, Wet Prep HPF POC: NONE SEEN
Yeast Wet Prep HPF POC: NONE SEEN

## 2018-09-04 LAB — CBC WITH DIFFERENTIAL/PLATELET
Abs Immature Granulocytes: 0.04 10*3/uL (ref 0.00–0.07)
Basophils Absolute: 0 10*3/uL (ref 0.0–0.1)
Basophils Relative: 0 %
Eosinophils Absolute: 0.2 10*3/uL (ref 0.0–0.5)
Eosinophils Relative: 2 %
HCT: 45.3 % (ref 36.0–46.0)
Hemoglobin: 15 g/dL (ref 12.0–15.0)
Immature Granulocytes: 0 %
Lymphocytes Relative: 25 %
Lymphs Abs: 2.5 10*3/uL (ref 0.7–4.0)
MCH: 29.1 pg (ref 26.0–34.0)
MCHC: 33.1 g/dL (ref 30.0–36.0)
MCV: 87.8 fL (ref 80.0–100.0)
Monocytes Absolute: 0.6 10*3/uL (ref 0.1–1.0)
Monocytes Relative: 6 %
Neutro Abs: 6.4 10*3/uL (ref 1.7–7.7)
Neutrophils Relative %: 67 %
Platelets: 279 10*3/uL (ref 150–400)
RBC: 5.16 MIL/uL — ABNORMAL HIGH (ref 3.87–5.11)
RDW: 12.5 % (ref 11.5–15.5)
WBC: 9.8 10*3/uL (ref 4.0–10.5)
nRBC: 0 % (ref 0.0–0.2)

## 2018-09-04 LAB — URINALYSIS, ROUTINE W REFLEX MICROSCOPIC
Bilirubin Urine: NEGATIVE
Glucose, UA: NEGATIVE mg/dL
Hgb urine dipstick: NEGATIVE
Ketones, ur: NEGATIVE mg/dL
Leukocytes,Ua: NEGATIVE
Nitrite: NEGATIVE
Protein, ur: NEGATIVE mg/dL
Specific Gravity, Urine: >1.03 — ABNORMAL HIGH (ref 1.005–1.030)
pH: 6 (ref 5.0–8.0)

## 2018-09-04 LAB — SARS CORONAVIRUS 2 BY RT PCR (HOSPITAL ORDER, PERFORMED IN ~~LOC~~ HOSPITAL LAB): SARS Coronavirus 2: NEGATIVE

## 2018-09-04 LAB — LIPASE, BLOOD: Lipase: 40 U/L (ref 11–51)

## 2018-09-04 MED ORDER — SODIUM CHLORIDE 0.9 % IV BOLUS
1000.0000 mL | Freq: Once | INTRAVENOUS | Status: AC
  Administered 2018-09-04: 1000 mL via INTRAVENOUS

## 2018-09-04 MED ORDER — HYDROMORPHONE HCL 1 MG/ML IJ SOLN
1.0000 mg | Freq: Once | INTRAMUSCULAR | Status: AC
  Administered 2018-09-04: 1 mg via INTRAVENOUS
  Filled 2018-09-04: qty 1

## 2018-09-04 MED ORDER — ONDANSETRON HCL 4 MG/2ML IJ SOLN
4.0000 mg | Freq: Once | INTRAMUSCULAR | Status: AC
  Administered 2018-09-04: 4 mg via INTRAVENOUS
  Filled 2018-09-04: qty 2

## 2018-09-04 MED ORDER — IOHEXOL 300 MG/ML  SOLN
100.0000 mL | Freq: Once | INTRAMUSCULAR | Status: AC | PRN
  Administered 2018-09-04: 22:00:00 100 mL via INTRAVENOUS

## 2018-09-04 MED ORDER — MORPHINE SULFATE (PF) 4 MG/ML IV SOLN
4.0000 mg | Freq: Once | INTRAVENOUS | Status: AC
  Administered 2018-09-04: 4 mg via INTRAVENOUS
  Filled 2018-09-04: qty 1

## 2018-09-04 NOTE — ED Triage Notes (Signed)
Right lower quadrant pain since early this am. She went to UC and was told to come here.

## 2018-09-04 NOTE — ED Notes (Addendum)
Patient to be sent to Radiology for [place] already left for today

## 2018-09-04 NOTE — ED Provider Notes (Signed)
Medical screening examination/treatment/procedure(s) were conducted as a shared visit with non-physician practitioner(s) and myself.  I personally evaluated the patient during the encounter. Briefly, the patient is a 43 y.o. female with history of headache, anxiety, celiac disease, fibroids status post partial hysterectomy who presents to the ED with abdominal pain.  Pain has been ongoing throughout the day.  Increasingly getting worse and in the right lower quadrant.  Patient denies any nausea or vomiting.  She has some tenderness throughout on exam but focally in the right lower quadrant.  CT scan was done that did not show any acute findings.  No obvious inflammation of the appendix but was not fully visualized.  Patient still does have her ovaries.  Concern for possible ovarian torsion or pelvic etiology of the pain.  Although this may clinically not be significant and may help rule out other causes for pain as concerned that there could still be appendicitis.  We will have her transferred over to [facility] for pelvic ultrasound.  Wet prep has been performed that shows bacterial vaginosis.  Possibly could be the cause of the pain but seems out of proportion.  Urinalysis sent is unremarkable.  Patient has not had much pain improvement despite fluids and IV pain medicine here.  Will have ultrasound performed at [facility].  Will have repeat examination.  May need admission for pain control, possibly repeat abdominal exams overnight as this still could possibly be appendicitis.  May need surgery eval.  This chart was dictated using voice recognition software.  Despite best efforts to proofread,  errors can occur which can change the documentation meaning.     EKG Interpretation None           Lennice Sites, DO 09/04/18 [id number]

## 2018-09-04 NOTE — ED Notes (Signed)
Patient transported to CT 

## 2018-09-04 NOTE — ED Notes (Signed)
[facility] notified (Tammy) - patient to be transported to [facility] ED
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
## 2018-09-05 ENCOUNTER — Emergency Department (HOSPITAL_COMMUNITY): Payer: BC Managed Care – PPO

## 2018-09-05 LAB — CBC WITH DIFFERENTIAL/PLATELET
Abs Immature Granulocytes: 0.03 10*3/uL (ref 0.00–0.07)
Basophils Absolute: 0 10*3/uL (ref 0.0–0.1)
Basophils Relative: 0 %
Eosinophils Absolute: 0.2 10*3/uL (ref 0.0–0.5)
Eosinophils Relative: 2 %
HCT: 42.9 % (ref 36.0–46.0)
Hemoglobin: 14.2 g/dL (ref 12.0–15.0)
Immature Granulocytes: 0 %
Lymphocytes Relative: 25 %
Lymphs Abs: 2.2 10*3/uL (ref 0.7–4.0)
MCH: 29.4 pg (ref 26.0–34.0)
MCHC: 33.1 g/dL (ref 30.0–36.0)
MCV: 88.8 fL (ref 80.0–100.0)
Monocytes Absolute: 0.6 10*3/uL (ref 0.1–1.0)
Monocytes Relative: 7 %
Neutro Abs: 5.7 10*3/uL (ref 1.7–7.7)
Neutrophils Relative %: 66 %
Platelets: 237 10*3/uL (ref 150–400)
RBC: 4.83 MIL/uL (ref 3.87–5.11)
RDW: 12.4 % (ref 11.5–15.5)
WBC: 8.7 10*3/uL (ref 4.0–10.5)
nRBC: 0 % (ref 0.0–0.2)

## 2018-09-05 LAB — LACTIC ACID, PLASMA: Lactic Acid, Venous: 0.7 mmol/L (ref 0.5–1.9)

## 2018-09-05 MED ORDER — HYDROMORPHONE HCL 1 MG/ML IJ SOLN
1.0000 mg | Freq: Once | INTRAMUSCULAR | Status: AC
  Administered 2018-09-05: 1 mg via INTRAVENOUS
  Filled 2018-09-05: qty 1

## 2018-09-05 MED ORDER — KETOROLAC TROMETHAMINE 30 MG/ML IJ SOLN
60.0000 mg | Freq: Once | INTRAMUSCULAR | Status: DC
  Filled 2018-09-05: qty 2

## 2018-09-05 MED ORDER — GADOBUTROL 1 MMOL/ML IV SOLN
10.0000 mL | Freq: Once | INTRAVENOUS | Status: AC | PRN
  Administered 2018-09-05: 10 mL via INTRAVENOUS

## 2018-09-05 MED ORDER — NAPROXEN 500 MG PO TABS: 500.0000 mg | ORAL_TABLET | Freq: Two times a day (BID) | ORAL | 0 refills | Status: DC | PRN

## 2018-09-05 MED ORDER — KETOROLAC TROMETHAMINE 30 MG/ML IJ SOLN
30.0000 mg | Freq: Once | INTRAMUSCULAR | Status: AC
  Administered 2018-09-05: 30 mg via INTRAVENOUS
  Filled 2018-09-05: qty 1

## 2018-09-05 NOTE — Discharge Instructions (Signed)
There are many causes of abdominal pain. Most pain is not serious and goes away, but some pain gets worse, changes, or will not go away. Please return to the emergency department or see your doctor right away if you (or your family member) experience any of the following:   Pain that gets worse or moves to just one spot.  Pain that gets worse if you cough or sneeze.  Pain with going over a bump in the road.  Pain that does not get better in 24 hours.  Inability to keep down liquids (vomiting)--especially if you are making less urine.  Fainting.  Blood in the vomit or stool.  High fever or shaking chills.  Swelling of the abdomen.  Any new or worsening problem.   Follow-up Instructions  Follow-up with your primary care provider within 1 week Medications  Take the following medications:  -Prescription has been sent to your pharmacy for naproxen.  This is a strong anti-inflammatory.  Please take as needed and prescribed.  It is important that you take food while taking this medication to avoid upsetting your stomach.  If you take this medicine do not take ibuprofen or other anti-inflammatory medications.   Additional Instructions  No alcohol.  No caffeine, aspirin, or cigarettes.

## 2018-09-05 NOTE — ED Provider Notes (Signed)
Patient accepted in transfer from [facility] where she was being evaluated for severe RLQ pain that started earlier in the morning. Has been constant, progressively worsening through the day. H/o partial hysterectomy 2019, fibroids. CT abd/pel inconclusive, appendix not visualized. Transferred for pelvic ultrasound r/o torsion.   On arrival, VSS. Pain 10/10. Significant tenderness RLQ, with guarding. Referred pain from remaining abdomen. She reports pain medications help very little. Will review medication and attempt further pain control. Ultrasound advised patient is in the department.   Ultrasound negative for evidence of ovarian cause of pain. Per [place] tech, her ovary was not visualized, negating the need for doppler study.   The patient has been evaluated by dr. Christy Gentles who consulted with radiology regarding benefit of MRI. Given her continued pain, significant tenderness, MR ordered and is pending. Patient care signed out to oncoming provider team pending MR results. If negative, anticipate discharge home with outpatient follow up.   Charlann Lange, PA-C 09/05/18 [id number]    Ripley Fraise, MD 09/05/18 [phone]

## 2018-09-05 NOTE — ED Provider Notes (Signed)
Care assumed from S. Upstill PA-C.  Please see her full H&P.  In short,  [name] is a 43 y.o. female presents for as transfer from [place] for severe RLQ pain for ovarian US. [place] is negative for torsion but pt still have pain and previous provider team discussed further imaging with radiologist and MR pelvis recommended.  Pt iscrrently resting comfortably in bed, MR pelvis performed and report pending.  MR is negative for acute findings. Appendix not well seen. No signs of ovarian torsion. Pt feeling better after IV Toradol, she has mild tenderness in RLQ on exam, without peritoneal signs. Pt is stable to be discharged home and follow up with pcp. Strict return precautions discussed.  MRI PELVIS WITHOUT AND WITH CONTRAST    TECHNIQUE:  Multiplanar multisequence MR imaging of the pelvis was performed  both before and after administration of intravenous contrast.    CONTRAST: 10 cc Gadavist    COMPARISON: CT abdomen 09/04/2018    FINDINGS:  Urinary Tract: The urinary bladder appears unremarkable. No urethral  abnormality observed.    Bowel: Unremarkable. Appendix not well seen.    Vascular/Lymphatic: Unremarkable    Reproductive:    Uterus: Surgically absent. Trace free fluid adjacent to the vaginal  cuff.    Left ovary: 3.3 by 2.0 by 2.0 cm (volume = 6.9 cm^3). Normal  enhancement. No surrounding fluid. Small follicles noted.    Right ovary: 3.3 by 1.6 by 2.9 cm (volume = 8 cm^3). Normal  enhancement. Small follicles noted. No surrounding edema.    Other: No supplemental non-categorized findings.    Musculoskeletal: Unremarkable    IMPRESSION:  1. The ovaries are of normal size and appearance and enhance  normally, without surrounding fluid signal, visible twisting of the  ovarian pedicle, or ovarian hemorrhage to suggest ovarian torsion.  2. Trace nonspecific fluid signal intensity along the vaginal cuff,  likely physiologic.       Electronically Signed  By: Van Clines M.D.  On: 09/05/2018 07:54   This note was prepared using Dragon voice recognition software and may include unintentional dictation errors due to the inherent limitations of voice recognition software.     Flint Melter 09/05/18 2250    Lajean Saver, MD 09/06/18 [phone]

## 2018-09-05 NOTE — ED Provider Notes (Signed)
I assumed care along with PA upstill on this patient after transfer from an outside facility Patient sent for further imaging after a negative CT abdomen pelvis with significant abdominal pain.  Pelvic ultrasound was limited, was unable to fully see any signs of torsion.  Patient still with significant abdominal pain.  I discussed the case at length with radiologist Dr. Jeannine Boga who reviewed imaging, we discussed next imaging modalities Plan will be to proceed with MR pelvis with and without contrast to further eval for torsion as she does still have her ovaries Patient was updated on plan.  She has focal right lower quadrant tenderness, but no other signs of acute abdomen   Ripley Fraise, MD 09/05/18 [phone]

## 2018-09-05 NOTE — ED Notes (Signed)
Patient transported to MRI 

## 2018-09-05 NOTE — ED Notes (Signed)
Returned from MRI 

## 2018-09-05 NOTE — ED Notes (Signed)
Pt verbalized understanding of d/c instructions and prescription. Pt waiting for husband to pick her up.

## 2018-09-06 LAB — GC/CHLAMYDIA PROBE AMP (~~LOC~~) NOT AT ARMC
Chlamydia: NEGATIVE
Neisseria Gonorrhea: NEGATIVE

## 2018-09-22 ENCOUNTER — Other Ambulatory Visit: Payer: Self-pay | Admitting: Family Medicine

## 2018-09-23 MED ORDER — AJOVY 225 MG/1.5ML ~~LOC~~ SOSY: 225.0000 mg | PREFILLED_SYRINGE | SUBCUTANEOUS | 0 refills | Status: DC

## 2018-09-24 ENCOUNTER — Telehealth: Payer: Self-pay | Admitting: *Deleted

## 2018-09-24 NOTE — Telephone Encounter (Signed)
Submitted PA Ajovy on CMM. Key: [id number]. PA Case ID: [id number] - Rx #: [id number]. Waiting on determination from [facility]. #4.13ml/90days supply.

## 2018-09-24 NOTE — Telephone Encounter (Signed)
PA approved 09/24/2018 - 09/24/2019. PA# [place] [phone] Non-Grandfathered [id number].

## 2018-10-01 ENCOUNTER — Encounter (INDEPENDENT_AMBULATORY_CARE_PROVIDER_SITE_OTHER): Payer: Self-pay | Admitting: Bariatrics

## 2018-10-01 ENCOUNTER — Encounter: Payer: Self-pay | Admitting: Family Medicine

## 2018-11-03 ENCOUNTER — Other Ambulatory Visit: Payer: Self-pay | Admitting: Family Medicine

## 2018-11-05 MED ORDER — AJOVY 225 MG/1.5ML ~~LOC~~ SOSY: 225.0000 mg | PREFILLED_SYRINGE | SUBCUTANEOUS | 0 refills | Status: DC

## 2018-11-18 ENCOUNTER — Encounter (INDEPENDENT_AMBULATORY_CARE_PROVIDER_SITE_OTHER): Payer: Self-pay | Admitting: Bariatrics

## 2018-11-28 ENCOUNTER — Encounter (INDEPENDENT_AMBULATORY_CARE_PROVIDER_SITE_OTHER): Payer: Self-pay | Admitting: Family Medicine

## 2018-12-03 ENCOUNTER — Ambulatory Visit (INDEPENDENT_AMBULATORY_CARE_PROVIDER_SITE_OTHER): Payer: BC Managed Care – PPO | Admitting: Family Medicine

## 2018-12-08 NOTE — Progress Notes (Addendum)
PATIENT: [name] DOB: [birth year]  REASON FOR VISIT: follow up HISTORY FROM: patient  Virtual Visit via Telephone Note  I connected with [name] on 12/09/18 at  9:00 AM EST by telephone and verified that I am speaking with the correct person using two identifiers.   I discussed the limitations, risks, security and privacy concerns of performing an evaluation and management service by telephone and the availability of in person appointments. I also discussed with the patient that there may be a patient responsible charge related to this service. The patient expressed understanding and agreed to proceed.   History of Present Illness:  12/09/18 [name] is a 44 y.o. female here today for follow up. Headaches are occurring fairly regularly. Most headaches are present all day. She will wake up with headache and it worsens throughout day. She has 2-4 migraines per month. She knows that stress is main trigger. She continues Ajovy monthly and Zonegran 263m at bedtime for prevention. URoselyn Meierusually helps with abortive therapy. She is also taking Wellbutrin 3043mdaily, Prozac 6073maily and gabapentin 100m24mice daily for anxiety and depression managed by psychiatry. She is seen every 4-8 weeks. Her adopted son continues to battle drug abuse. She admits that she is not eating as healthy as she should. 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 She has OSA diagnosed with Dr DohmBrett Fairy9/2019. She used CPAP therapy fairly consistently and noted significant benefit in sleep quality. Unfortunately, she has not used CPAP recently. She was recently seen by the ER out of town for right sided headache that was diagnosed as an ear infection. She denies stroke symptoms. CT imaging was normal.    History (copied from my note on 08/19/2018)  [name] 42 y89. female here today for follow up for migraines. She continues Zonegran 200mg28mly and Ajovy monthly for  prevention. She did note improvement of her migraines after starting Ajovy. Unfortunately, over the past few weeks, she has noticed that they have returned. She has noticed worsening of fogginess and memory concerns for about a year. She was having an eye exam about two weeks ago and was asked by the provider if she was having memory concerns. Once she confirmed he asked her to talk with us abKoreat Zonegran as this was a common side effect. She has taken this for several years with noted improvement of migraines. 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 She continues to suffer from increased stress, anxiety and depression as her adopted son is suffering from drug abuse. She is working with psychiatry for treatment of anxiety and depression. She is taking Prozac 60mg 20my, Wellbutrin 300mg d64m, and Atarax 25mg as81mded. She was recently started on gabapentin 100mg twi38maily for anxiety. She is seen by her therapist twice weekly. 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 She follows with PCP closely for wellness and HLD treatment. She does not exercise. She uses CPAP therapy for OSA.   History (copied from my note on 02/27/2018)  Brittany M BASHLLEY BOOHER.fe66lehere today for follow up. Headaches usually start in the occipital region (left or right). She describes a pounding/throbbing sensation. She is nauseated with headaches and is sensitive to light, sound and odor. She does occasionally see flashing lights before the headache starts. Dark room and pressure seems to help. Stress worsens. She is taking Zonegran 200mg dail77m  She uses Ansaid about 4 times a week. Prior to worsening in the past two weeks she was not having to take Nsaid but once a week or so. She takes melatonin, Prozac and Wellbutrin. She admits that she has more stress at home right now with her adopted son who is struggling with drug abuse. She is participating with  [facility] and has lost 25 pounds since the end of September. She cut out caffeine in December.   She is using CPAP nightly. She was started in October but was not able to use consistently until December 2019.The past 30-day download compliance report reveals that she is using her CPAP 27 out of 30 days for compliance of 90%. She is using CPAP for greater than 4 hours 23 of the 30 days for compliance of 77%. On average she uses her CPAP 5 hours and 48 minutes. Her AHI is 0.7 on 4 to 8 cm of water with a EPR of 3. There is no significant leak. Good benefit from using CPAP regularly.  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 She has had a lot of stress. Still having the migraines. She has daily headaches. 10 migraine days a month. She had a sleep study done May 11. The sleep study shows sleep apnea. She had a monitoring time of 5 hour 41 minutes. With an AHI 14.1. Also obesity  HPI: [name] a 43 years old female, seen in refer byher primary care PA Couillard, Jennifer,for evaluation of chronic migraine headache initial evaluation was on November 19 2016.  I reviewed and summarized the referring note, she had a history of iron deficiency anemia, obesity, obstructive sleep apnea,  She reported history of migraine headaches since age 81, her typical migraine left lateralized severe pounding headache with associated light noise sensitivity, first headache she reported last more than one year, later on she have intermittent migraine headaches, about 4 days out of a week she would suffer moderate to severe migraine headaches, lying dark quiet room was helpful, she has tried different triptan seen in the past with limited help,  She was under the neurologistDr.Lewettcare, I was able to review office visit, the most recent wason April 25 2016,patientcomplains  headaches on a daily basis,on average in amount since then, she has 3 severe migraine headaches, 3 moderate headaches, she was given Zonegran 25 mg 4 tablets every night as preventative medications, patient complains of significant side effect with Topamax in the past, numbness of bilateral upper extremity, mental confusion,  She has visual aura sometimes with her typical migraines, flashing light in her visual field lasting for a few minutes before the onset of severe headaches,  For abortive treatment, she was given Phenergan, and NSAIDs as needed with limited help,  Trigger for her migraines are stress, hungry, exertion, weather changes,  She was recently diagnosed with iron deficiency anemia, ferritin level was 6, received IV iron infusion twice since October 2018, since then, her migraine has changed, instead of starting at the left occipital region, it often started at the right side, spreading forward, continue moderate severe headaches with limited help from NSAIDs and Phenergan,  In addition,MRI of the brainin February 2018showedsmallright cerebellum infarction,she had extensive evaluations  Laboratory evaluations: CBC showed hemoglobin of 11.3, decreased MCV 71, ESR 19, negativeornormal anticardiolipin antibody, RPR, ANA, LDL 103, cholesterol 162, normal CMP, homocystine, antithrombin III, protein C, S, negative factor V [place],  TEEin April 2018was reported normal,no PFO  EKG showed heart rhythm of 59, no acute abnormality,\\she was advised to take baby aspirin every day,  MRI of cervical spine showed multilevel mild degenerative changes there was no significant canal or foraminal narrowing.   Observations/Objective:  Generalized: Well developed, in no acute distress  Mentation: Alert oriented to time, place, history taking. Follows all commands speech and language fluent   Assessment and Plan:  43 y.o. year old female  has a past medical history of  Anemia (2019), Anxiety, Back pain, Bronchospasm, Celiac disease, Chronic tonsillitis, Depression, Fibroid, GERD (gastroesophageal reflux disease), Headache(784.0), High cholesterol, Hives, Hypertension, Migraine, Migraines, Morbid obesity ([place]), Pre-diabetes, Rape, Sleep apnea, and Stroke ([place]). here with    ICD-10-CM   1. Chronic migraine without aura without status migrainosus, not intractable  G43.709   2. History of stroke involving cerebellum  Z86.73   3. OSA (obstructive sleep apnea)  G47.33   4. Anxiety  F41.9     Ms. Zenia Resides continues to have near daily headaches with 2-4 migrainous headaches per month.  She does note improvement with Ajovy and zonisamide.  We will continue current therapy.  She will continue Ubrelvy as needed for abortive therapy.  She will also continue close follow-up with psychiatry.  We have discussed diagnosis of obstructive sleep apnea and how this may contribute to both headaches and anxiety/depression.  I have encouraged her to resume CPAP therapy at home.  She expresses willingness to resume therapy as she has noted significant benefit in the past.  I have also advised that she seek an appointment for a CPE with her primary care provider.  Healthy well-balanced diet and regular exercise advised.  We will follow-up with her in 12 weeks to assess compliance with CPAP and its response with her daily headaches.  She will call with any new or worsening symptoms.  Stroke symptoms reviewed.  She is aware of when to seek medical attention.  She verbalizes understanding and agreement with this plan.  No orders of the defined types were placed in this encounter.   No orders of the defined types were placed in this encounter.    Follow Up Instructions:  I discussed the assessment and treatment plan with the patient. The patient was provided an opportunity to ask questions and all were answered. The patient agreed with the plan and demonstrated an understanding of the  instructions.   The patient was advised to call back or seek an in-person evaluation if the symptoms worsen or if the condition fails to improve as anticipated.  I provided 20 minutes of non-face-to-face time during this encounter.  [place] is located in her car during my chart visit.  Provider is in the office.   Debbora Presto, NP   Made any corrections needed, and agree with history, physical, neuro exam,assessment and plan as stated.     Sarina Ill, MD [facility]

## 2018-12-09 ENCOUNTER — Encounter: Payer: Self-pay | Admitting: Family Medicine

## 2018-12-09 ENCOUNTER — Telehealth (INDEPENDENT_AMBULATORY_CARE_PROVIDER_SITE_OTHER): Payer: BC Managed Care – PPO | Admitting: Family Medicine

## 2018-12-09 DIAGNOSIS — Z8673 Personal history of transient ischemic attack (TIA), and cerebral infarction without residual deficits: Secondary | ICD-10-CM

## 2018-12-09 DIAGNOSIS — F419 Anxiety disorder, unspecified: Secondary | ICD-10-CM

## 2018-12-09 DIAGNOSIS — G4733 Obstructive sleep apnea (adult) (pediatric): Secondary | ICD-10-CM

## 2018-12-09 DIAGNOSIS — G43709 Chronic migraine without aura, not intractable, without status migrainosus: Secondary | ICD-10-CM

## 2018-12-09 MED ORDER — AJOVY 225 MG/1.5ML ~~LOC~~ SOSY: 225.0000 mg | PREFILLED_SYRINGE | SUBCUTANEOUS | 0 refills | Status: DC

## 2018-12-10 NOTE — Progress Notes (Signed)
Added buspar 10mg  po BID per pt 12-10-18. sy

## 2019-01-22 ENCOUNTER — Other Ambulatory Visit: Payer: Self-pay | Admitting: Neurology

## 2019-01-23 ENCOUNTER — Other Ambulatory Visit: Payer: Self-pay | Admitting: Neurology

## 2019-01-23 ENCOUNTER — Encounter: Payer: Self-pay | Admitting: Obstetrics & Gynecology

## 2019-01-27 ENCOUNTER — Other Ambulatory Visit: Payer: Self-pay | Admitting: Obstetrics & Gynecology

## 2019-01-27 DIAGNOSIS — L299 Pruritus, unspecified: Secondary | ICD-10-CM

## 2019-01-30 ENCOUNTER — Ambulatory Visit: Payer: BC Managed Care – PPO | Attending: Internal Medicine

## 2019-01-30 DIAGNOSIS — Z20822 Contact with and (suspected) exposure to covid-19: Secondary | ICD-10-CM

## 2019-01-31 LAB — NOVEL CORONAVIRUS, NAA: SARS-CoV-2, NAA: NOT DETECTED

## 2019-02-03 ENCOUNTER — Ambulatory Visit: Payer: BC Managed Care – PPO | Attending: Internal Medicine

## 2019-02-03 DIAGNOSIS — Z20822 Contact with and (suspected) exposure to covid-19: Secondary | ICD-10-CM

## 2019-02-04 LAB — NOVEL CORONAVIRUS, NAA: SARS-CoV-2, NAA: DETECTED — AB

## 2019-02-05 ENCOUNTER — Other Ambulatory Visit: Payer: Self-pay | Admitting: Nurse Practitioner

## 2019-02-05 ENCOUNTER — Encounter: Payer: Self-pay | Admitting: Obstetrics & Gynecology

## 2019-02-05 ENCOUNTER — Telehealth: Payer: Self-pay | Admitting: Nurse Practitioner

## 2019-02-05 DIAGNOSIS — U071 COVID-19: Secondary | ICD-10-CM

## 2019-02-05 DIAGNOSIS — I10 Essential (primary) hypertension: Secondary | ICD-10-CM

## 2019-02-05 NOTE — Telephone Encounter (Signed)
Called to discuss with [name] about Covid symptoms and the use of bamlanivimab, a monoclonal antibody infusion for those with mild to moderate Covid symptoms and at a high risk of hospitalization.     Pt is qualified for this infusion at the [facility] infusion center due to co-morbid conditions (obesity, diabetes) and/or a member of an at-risk group. She is experiencing loss of smell, taste, cough, shortness of breath, and body aches. She verbalizes wishes to receive infusion and understanding of infusion appointment and treatment.   She has been scheduled as requested for infusion on 02/08/19 @ 1020.   Patient Active Problem List   Diagnosis Date Noted  . Vitamin D deficiency 03/25/2018  . Class 3 severe obesity with serious comorbidity and body mass index (BMI) of 50.0 to 59.9 in adult ([place]) 11/26/2017  . Other fatigue 10/10/2017  . Shortness of breath on exertion 10/10/2017  . Late effect of cerebrovascular accident (CVA) 10/10/2017  . Bronchospasm   . Anxiety 07/29/2017  . Class 2 severe obesity with serious comorbidity and body mass index (BMI) of 37.0 to 37.9 in adult ([place]) 07/29/2017  . Chronic migraine 11/19/2016  . History of stroke involving cerebellum 11/19/2016  . Iron deficiency anemia 09/25/2016  . Low serum HDL 09/25/2016  . OSA (obstructive sleep apnea) 05/30/2016  . Snoring 03/20/2016  . Essential hypertension 02/11/2016  . Migraine with aura and without status migrainosus, not intractable 02/11/2016  . Allergic urticaria 03/25/2014  . Celiac disease 07/16/2013    Alda Lea, AGPCNP-BC Palliative Medicine Team  Phone: [phone] Pager: [phone] Amion: Bjorn Pippin

## 2019-02-05 NOTE — Progress Notes (Signed)
  I connected by phone with [name] on 02/05/2019 at 10:40 AM to discuss the potential use of an new treatment for mild to moderate COVID-19 viral infection in non-hospitalized patients.  This patient is a 44 y.o. female that meets the FDA criteria for Emergency Use Authorization of bamlanivimab or casirivimab\imdevimab.  Has a (+) direct SARS-CoV-2 viral test result  Has mild or moderate COVID-19   Is ? 43 years of age and weighs ? 40 kg  Is NOT hospitalized due to COVID-19  Is NOT requiring oxygen therapy or requiring an increase in baseline oxygen flow rate due to COVID-19  Is within 10 days of symptom onset  Has at least one of the high risk factor(s) for progression to severe COVID-19 and/or hospitalization as defined in EUA.  Specific high risk criteria : BMI >/= 35, hypertension, and CVA.    I have spoken and communicated the following to the patient or parent/caregiver:  1. FDA has authorized the emergency use of bamlanivimab and casirivimab\imdevimab for the treatment of mild to moderate COVID-19 in adults and pediatric patients with positive results of direct SARS-CoV-2 viral testing who are 71 years of age and older weighing at least 40 kg, and who are at high risk for progressing to severe COVID-19 and/or hospitalization.  2. The significant known and potential risks and benefits of bamlanivimab and casirivimab\imdevimab, and the extent to which such potential risks and benefits are unknown.  3. Information on available alternative treatments and the risks and benefits of those alternatives, including clinical trials.  4. Patients treated with bamlanivimab and casirivimab\imdevimab should continue to self-isolate and use infection control measures (e.g., wear mask, isolate, social distance, avoid sharing personal items, clean and disinfect "high touch" surfaces, and frequent handwashing) according to CDC guidelines.   5. The patient or parent/caregiver has the option  to accept or refuse bamlanivimab or casirivimab\imdevimab .  After reviewing this information with the patient, The patient agreed to proceed with receiving the bamlanimivab infusion and will be provided a copy of the Fact sheet prior to receiving the infusion.   [name] 02/05/2019 10:40 AM

## 2019-02-06 ENCOUNTER — Telehealth: Payer: Self-pay | Admitting: Obstetrics & Gynecology

## 2019-02-06 NOTE — Telephone Encounter (Signed)
Visit Follow-Up Question Received: Yesterday Message Contents  Mariene, Dick[place] sent to [place]  Phone Number: [phone]  So I got the voicemail from the allergy doctor and then it got deleted- can you send me their contact info again? I'm sorry. Just tested positive for the coronavirus so I know I'm going to have to wait until that's over before I can go, but I do want to check this out. (Unless these hives are coronavirus related)  Thanks,  Dorian Pod

## 2019-02-06 NOTE — Telephone Encounter (Signed)
Left message to call Sharee Pimple, RN at [place].     [facility]  Dr. Harold Hedge [facility] [address] [place], [place]  [address] [phone] (phone)

## 2019-02-08 ENCOUNTER — Encounter: Payer: Self-pay | Admitting: Obstetrics & Gynecology

## 2019-02-08 ENCOUNTER — Ambulatory Visit (HOSPITAL_COMMUNITY)
Admission: RE | Admit: 2019-02-08 | Discharge: 2019-02-08 | Disposition: A | Discharge status: Home / Self Care | Payer: BC Managed Care – PPO | Source: Ambulatory Visit | Attending: Pulmonary Disease | Admitting: Pulmonary Disease

## 2019-02-08 DIAGNOSIS — I10 Essential (primary) hypertension: Secondary | ICD-10-CM | POA: Insufficient documentation

## 2019-02-08 DIAGNOSIS — U071 COVID-19: Secondary | ICD-10-CM | POA: Insufficient documentation

## 2019-02-08 MED ORDER — DIPHENHYDRAMINE HCL 50 MG/ML IJ SOLN: 50.0000 mg | Freq: Once | INTRAMUSCULAR | Status: DC | PRN

## 2019-02-08 MED ORDER — SODIUM CHLORIDE 0.9 % IV SOLN
700.0000 mg | Freq: Once | INTRAVENOUS | Status: AC
  Administered 2019-02-08: 12:00:00 700 mg via INTRAVENOUS
  Filled 2019-02-08: qty 20

## 2019-02-08 MED ORDER — ALBUTEROL SULFATE HFA 108 (90 BASE) MCG/ACT IN AERS: 2.0000 | INHALATION_SPRAY | Freq: Once | RESPIRATORY_TRACT | Status: DC | PRN

## 2019-02-08 MED ORDER — SODIUM CHLORIDE 0.9 % IV SOLN
INTRAVENOUS | Status: DC | PRN
  Administered 2019-02-08: 250 mL via INTRAVENOUS

## 2019-02-08 MED ORDER — METHYLPREDNISOLONE SODIUM SUCC 125 MG IJ SOLR: 125.0000 mg | Freq: Once | INTRAMUSCULAR | Status: DC | PRN

## 2019-02-08 MED ORDER — EPINEPHRINE 0.3 MG/0.3ML IJ SOAJ: 0.3000 mg | Freq: Once | INTRAMUSCULAR | Status: DC | PRN

## 2019-02-08 MED ORDER — FAMOTIDINE IN NACL 20-0.9 MG/50ML-% IV SOLN: 20.0000 mg | Freq: Once | INTRAVENOUS | Status: DC | PRN

## 2019-02-08 NOTE — Discharge Instructions (Signed)

## 2019-02-08 NOTE — Progress Notes (Signed)
  Diagnosis: COVID-19  Physician: Dr. Joya Gaskins  Procedure: [facility] Med: bamlanivimab infusion - Provided patient with bamlanimivab fact sheet for patients, parents and caregivers prior to infusion.  Complications: No immediate complications noted.  Discharge: Discharged home   Acquanetta Chain 02/08/2019

## 2019-02-10 NOTE — Telephone Encounter (Signed)
No return call from patient.   MyChart message to patient with referral contact information.    Encounter closed.   Routing to Dr. Lestine Box.   Cc: Magdalene Patricia

## 2019-02-16 ENCOUNTER — Telehealth: Payer: Self-pay | Admitting: *Deleted

## 2019-02-16 NOTE — Telephone Encounter (Signed)
Charyl Bigger, RN  Phone Number: [phone]  Hi. I know I spoke to someone last week and set up my appointment on the 24th, however my hives are getting worse by the min. What can I do? Is there any way I can come in sooner?  Kathi Der with patient. Patient is scheduled to see her PCP at 10:20 this morning. Due to exposure to [place] unable to be seen any sooner by Allergy specialist. Denies SOB, swelling, Wheezing or difficultly breathing. Reports itchy hives on upper and lower extremities. Advised to proceed with OV as scheduled with PC, ER or Urgent Care if symptoms worsen. Patient thankful for call.   Routing to provider for final review. Patient is agreeable to disposition. Will close encounter.

## 2019-03-13 ENCOUNTER — Encounter: Payer: Self-pay | Admitting: Family Medicine

## 2019-03-16 ENCOUNTER — Telehealth: Payer: Self-pay

## 2019-03-16 NOTE — Telephone Encounter (Signed)
Called pt and spoke directly to pt directly to go over CPAP compliance and to see if she would like to r/s her appointment. Pt states that she would like to keep her appointment to continue he medication and go over her meds with the provider. She has not been using the CPAP recently.

## 2019-03-17 ENCOUNTER — Telehealth (INDEPENDENT_AMBULATORY_CARE_PROVIDER_SITE_OTHER): Payer: BC Managed Care – PPO | Admitting: Family Medicine

## 2019-03-17 ENCOUNTER — Encounter: Payer: Self-pay | Admitting: Family Medicine

## 2019-03-17 DIAGNOSIS — G4733 Obstructive sleep apnea (adult) (pediatric): Secondary | ICD-10-CM

## 2019-03-17 DIAGNOSIS — G43709 Chronic migraine without aura, not intractable, without status migrainosus: Secondary | ICD-10-CM | POA: Diagnosis not present

## 2019-03-17 MED ORDER — AJOVY 225 MG/1.5ML ~~LOC~~ SOSY: 225.0000 mg | PREFILLED_SYRINGE | SUBCUTANEOUS | 3 refills | Status: AC

## 2019-03-17 MED ORDER — ZONISAMIDE 100 MG PO CAPS: 200.0000 mg | ORAL_CAPSULE | Freq: Every day | ORAL | 3 refills | Status: DC

## 2019-03-17 MED ORDER — UBRELVY 100 MG PO TABS: 100.0000 mg | ORAL_TABLET | Freq: Every day | ORAL | 11 refills | Status: AC | PRN
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
## 2019-03-27 ENCOUNTER — Other Ambulatory Visit: Payer: Self-pay

## 2019-03-27 ENCOUNTER — Ambulatory Visit: Payer: BC Managed Care – PPO | Attending: Internal Medicine

## 2019-03-27 DIAGNOSIS — Z20822 Contact with and (suspected) exposure to covid-19: Secondary | ICD-10-CM

## 2019-03-28 LAB — NOVEL CORONAVIRUS, NAA: SARS-CoV-2, NAA: NOT DETECTED

## 2019-03-30 ENCOUNTER — Telehealth: Payer: Self-pay

## 2019-03-30 NOTE — Telephone Encounter (Signed)
Received PA request from covermymeds for ubrelvy. Key: [id number]. Completed via covermymeds. Sent to [employer] [employer].  Received this notice: "Your information has been submitted to [employer]. To check for an updated outcome later, reopen this PA request from your dashboard.  If [employer] has not responded to your request within 24 hours, contact [place] at [phone]. If you think there may be a problem with your PA request, use our live chat feature at the bottom right."

## 2019-03-30 NOTE — Telephone Encounter (Signed)
Received approval for ubrelvy 135m tabs thru 3-15 thru 03-29-20 [employer]. PA [id number].

## 2019-04-03 ENCOUNTER — Encounter: Payer: Self-pay | Admitting: Certified Nurse Midwife

## 2019-10-05 NOTE — Progress Notes (Signed)
PA submitted for Ajovy 242m on Cover my Meds. There is a 24/72hr turn around. PA ID #: [id number]: [id number]: PENDING  Your information has been submitted to [place] To check for an updated outcome later, reopen this PA request from your dashboard.  If [employer] has not responded to your request within 24 hours, contact [place] [phone] If you think there may be a problem with your PA request, use our live chat feature at the bottom right.

## 2019-10-07 NOTE — Progress Notes (Signed)
Checked cover my meds and a reponse of : N/A   [name] (Key: [id number])  This request has received a N/A outcome.  Please note any additional information provided by [employer] at the bottom of this request.  Your PA request has been closed. [id number]

## 2019-10-30 ENCOUNTER — Ambulatory Visit: Payer: Self-pay | Admitting: Obstetrics & Gynecology

## 2019-11-02 ENCOUNTER — Other Ambulatory Visit: Payer: Self-pay | Admitting: Certified Nurse Midwife

## 2019-11-02 ENCOUNTER — Other Ambulatory Visit: Payer: Self-pay | Admitting: *Deleted

## 2019-11-02 DIAGNOSIS — Z1231 Encounter for screening mammogram for malignant neoplasm of breast: Secondary | ICD-10-CM

## 2019-11-03 ENCOUNTER — Other Ambulatory Visit: Payer: Self-pay | Admitting: Obstetrics & Gynecology

## 2019-11-03 DIAGNOSIS — Z1231 Encounter for screening mammogram for malignant neoplasm of breast: Secondary | ICD-10-CM

## 2019-11-04 ENCOUNTER — Other Ambulatory Visit: Payer: Self-pay

## 2019-11-04 ENCOUNTER — Ambulatory Visit
Admission: RE | Admit: 2019-11-04 | Discharge: 2019-11-04 | Disposition: A | Discharge status: Home / Self Care | Payer: BC Managed Care – PPO | Source: Ambulatory Visit | Attending: Obstetrics & Gynecology | Admitting: Obstetrics & Gynecology

## 2019-11-04 DIAGNOSIS — Z1231 Encounter for screening mammogram for malignant neoplasm of breast: Secondary | ICD-10-CM

## 2020-01-20 ENCOUNTER — Encounter (HOSPITAL_BASED_OUTPATIENT_CLINIC_OR_DEPARTMENT_OTHER): Payer: Self-pay

## 2020-01-20 ENCOUNTER — Emergency Department (HOSPITAL_BASED_OUTPATIENT_CLINIC_OR_DEPARTMENT_OTHER): Payer: BC Managed Care – PPO

## 2020-01-20 ENCOUNTER — Other Ambulatory Visit: Payer: Self-pay

## 2020-01-20 ENCOUNTER — Emergency Department (HOSPITAL_BASED_OUTPATIENT_CLINIC_OR_DEPARTMENT_OTHER)
Admission: EM | Admit: 2020-01-20 | Discharge: 2020-01-20 | Disposition: A | Discharge status: Home / Self Care | Payer: BC Managed Care – PPO | Attending: Emergency Medicine | Admitting: Emergency Medicine

## 2020-01-20 DIAGNOSIS — R0602 Shortness of breath: Secondary | ICD-10-CM | POA: Diagnosis not present

## 2020-01-20 DIAGNOSIS — R519 Headache, unspecified: Secondary | ICD-10-CM | POA: Diagnosis not present

## 2020-01-20 DIAGNOSIS — R0789 Other chest pain: Secondary | ICD-10-CM | POA: Diagnosis not present

## 2020-01-20 DIAGNOSIS — R079 Chest pain, unspecified: Secondary | ICD-10-CM | POA: Diagnosis present

## 2020-01-20 DIAGNOSIS — I10 Essential (primary) hypertension: Secondary | ICD-10-CM | POA: Insufficient documentation

## 2020-01-20 DIAGNOSIS — R7303 Prediabetes: Secondary | ICD-10-CM | POA: Insufficient documentation

## 2020-01-20 HISTORY — DX: Fibromyalgia: M79.7

## 2020-01-20 HISTORY — DX: Post-traumatic stress disorder, unspecified: F43.10

## 2020-01-20 HISTORY — DX: Unspecified osteoarthritis, unspecified site: M19.90

## 2020-01-20 LAB — BASIC METABOLIC PANEL
Anion gap: 8 (ref 5–15)
BUN: 20 mg/dL (ref 6–20)
CO2: 27 mmol/L (ref 22–32)
Calcium: 9.5 mg/dL (ref 8.9–10.3)
Chloride: 101 mmol/L (ref 98–111)
Creatinine, Ser: 0.55 mg/dL (ref 0.44–1.00)
GFR, Estimated: >60 mL/min (ref >60)
Glucose, Bld: 86 mg/dL (ref 70–99)
Potassium: 3.8 mmol/L (ref 3.5–5.1)
Sodium: 136 mmol/L (ref 135–145)

## 2020-01-20 LAB — PREGNANCY, URINE: Preg Test, Ur: NEGATIVE

## 2020-01-20 LAB — CBC
HCT: 38.5 % (ref 36.0–46.0)
Hemoglobin: 13.3 g/dL (ref 12.0–15.0)
MCH: 30 pg (ref 26.0–34.0)
MCHC: 34.5 g/dL (ref 30.0–36.0)
MCV: 86.7 fL (ref 80.0–100.0)
Platelets: 237 10*3/uL (ref 150–400)
RBC: 4.44 MIL/uL (ref 3.87–5.11)
RDW: 12.5 % (ref 11.5–15.5)
WBC: 7.6 10*3/uL (ref 4.0–10.5)
nRBC: 0 % (ref 0.0–0.2)

## 2020-01-20 LAB — TROPONIN I (HIGH SENSITIVITY): Troponin I (High Sensitivity): <2 ng/L (ref <18)

## 2020-01-20 MED ORDER — ALUM & MAG HYDROXIDE-SIMETH 200-200-20 MG/5ML PO SUSP
30.0000 mL | Freq: Once | ORAL | Status: AC
  Administered 2020-01-20: 30 mL via ORAL
  Filled 2020-01-20: qty 30

## 2020-01-20 MED ORDER — DIPHENHYDRAMINE HCL 50 MG/ML IJ SOLN
25.0000 mg | Freq: Once | INTRAMUSCULAR | Status: AC
  Administered 2020-01-20: 25 mg via INTRAVENOUS
  Filled 2020-01-20: qty 1

## 2020-01-20 MED ORDER — PROCHLORPERAZINE EDISYLATE 10 MG/2ML IJ SOLN
10.0000 mg | Freq: Once | INTRAMUSCULAR | Status: AC
  Administered 2020-01-20: 10 mg via INTRAVENOUS
  Filled 2020-01-20: qty 2

## 2020-01-20 MED ORDER — LIDOCAINE VISCOUS HCL 2 % MT SOLN
15.0000 mL | Freq: Once | OROMUCOSAL | Status: AC
  Administered 2020-01-20: 15 mL via ORAL
  Filled 2020-01-20: qty 15
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
## 2020-01-20 NOTE — ED Triage Notes (Addendum)
Pt c/o CP x 2 days-denies fever/flu sx-NAD-steady gait

## 2020-01-20 NOTE — ED Notes (Signed)
ED Provider at bedside. 

## 2020-03-08 ENCOUNTER — Encounter: Payer: Self-pay | Admitting: Physician Assistant

## 2020-03-22 ENCOUNTER — Ambulatory Visit: Payer: BC Managed Care – PPO | Admitting: Physician Assistant

## 2020-03-22 ENCOUNTER — Encounter: Payer: Self-pay | Admitting: Physician Assistant

## 2020-03-22 VITALS — BP 130/80 | HR 80 | Ht 63.0 in | Wt 246.0 lb

## 2020-03-22 DIAGNOSIS — R1031 Right lower quadrant pain: Secondary | ICD-10-CM | POA: Diagnosis not present

## 2020-03-22 DIAGNOSIS — K625 Hemorrhage of anus and rectum: Secondary | ICD-10-CM

## 2020-03-22 DIAGNOSIS — K219 Gastro-esophageal reflux disease without esophagitis: Secondary | ICD-10-CM

## 2020-03-22 DIAGNOSIS — R1032 Left lower quadrant pain: Secondary | ICD-10-CM | POA: Diagnosis not present

## 2020-03-22 MED ORDER — PLENVU 140 G PO SOLR: ORAL | 0 refills | Status: DC

## 2020-03-22 MED ORDER — PANTOPRAZOLE SODIUM 40 MG PO TBEC: 40.0000 mg | DELAYED_RELEASE_TABLET | Freq: Two times a day (BID) | ORAL | 5 refills | Status: DC

## 2020-03-22 MED ORDER — DICYCLOMINE HCL 20 MG PO TABS: 20.0000 mg | ORAL_TABLET | Freq: Three times a day (TID) | ORAL | 5 refills | Status: DC
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
## 2020-03-22 NOTE — Patient Instructions (Signed)
If you are age 44 or older, your body mass index should be between 23-30. Your Body mass index is 43.58 kg/m. If this is out of the aforementioned range listed, please consider follow up with your Primary Care Provider.  If you are age 52 or younger, your body mass index should be between 19-25. Your Body mass index is 43.58 kg/m. If this is out of the aformentioned range listed, please consider follow up with your Primary Care Provider.   You have been scheduled for an endoscopy and colonoscopy. Please follow the written instructions given to you at your visit today. Please pick up your prep supplies at the pharmacy within the next 1-3 days. If you use inhalers (even only as needed), please bring them with you on the day of your procedure.   Thank you for choosing me and [facility].  Ellouise Newer, PA-C

## 2020-03-23 NOTE — Progress Notes (Signed)
Reviewed and agree with management plan.  Malcolm T. Stark, MD FACG [phone]  

## 2020-03-26 ENCOUNTER — Other Ambulatory Visit: Payer: Self-pay | Admitting: Physician Assistant

## 2020-03-28 ENCOUNTER — Telehealth: Payer: Self-pay

## 2020-03-28 ENCOUNTER — Other Ambulatory Visit: Payer: Self-pay

## 2020-03-28 NOTE — Telephone Encounter (Signed)
Called and left a voicemail for patient stating that plenvu was on back order but she could come to the office to pick up a sample of Plenvu for her procedure.

## 2020-03-29 ENCOUNTER — Other Ambulatory Visit: Payer: Self-pay | Admitting: Physician Assistant

## 2020-04-10 DIAGNOSIS — K828 Other specified diseases of gallbladder: Secondary | ICD-10-CM

## 2020-04-10 HISTORY — DX: Other specified diseases of gallbladder: K82.8

## 2020-04-11 ENCOUNTER — Telehealth: Payer: Self-pay | Admitting: Physician Assistant

## 2020-04-11 NOTE — Telephone Encounter (Signed)
Reviewed note and JL's office note from 3/8.

## 2020-04-11 NOTE — Telephone Encounter (Signed)
Inbound call from patient re [name] a call back please.  Was seen at the ED yesterday and wants to advise of what she was diagnosed with.

## 2020-04-11 NOTE — Telephone Encounter (Signed)
Noted  

## 2020-04-11 NOTE — Telephone Encounter (Signed)
Pt states she was seen in the ER at [facility] in [place] yesterday for severe diarrhea/vomiting/bad cramping. Reports she had an abd [place] and it showed sludge in the gallbladder. She was told to let her doctor here know. She was given cipro, bentyl, and zofran. Dr. Fuller Plan notified, pt has ECL scheduled with Dr. Fuller Plan 04/19/20.

## 2020-04-15 ENCOUNTER — Telehealth: Payer: Self-pay | Admitting: Gastroenterology

## 2020-04-15 NOTE — Telephone Encounter (Signed)
Patient will come pick up sample Plenvu kit today before 5pm

## 2020-04-18 DIAGNOSIS — R1032 Left lower quadrant pain: Secondary | ICD-10-CM | POA: Insufficient documentation

## 2020-04-18 DIAGNOSIS — R194 Change in bowel habit: Secondary | ICD-10-CM | POA: Insufficient documentation

## 2020-04-18 DIAGNOSIS — R142 Eructation: Secondary | ICD-10-CM | POA: Insufficient documentation

## 2020-04-18 DIAGNOSIS — R1310 Dysphagia, unspecified: Secondary | ICD-10-CM | POA: Insufficient documentation

## 2020-04-18 DIAGNOSIS — R141 Gas pain: Secondary | ICD-10-CM | POA: Insufficient documentation

## 2020-04-19 ENCOUNTER — Encounter: Payer: Self-pay | Admitting: Gastroenterology

## 2020-04-19 ENCOUNTER — Ambulatory Visit (AMBULATORY_SURGERY_CENTER): Payer: BC Managed Care – PPO | Admitting: Gastroenterology

## 2020-04-19 VITALS — BP 137/81 | HR 71 | Temp 97.1°F | Resp 15 | Ht 63.0 in | Wt 246.0 lb

## 2020-04-19 DIAGNOSIS — K3189 Other diseases of stomach and duodenum: Secondary | ICD-10-CM | POA: Diagnosis not present

## 2020-04-19 DIAGNOSIS — K2970 Gastritis, unspecified, without bleeding: Secondary | ICD-10-CM | POA: Diagnosis not present

## 2020-04-19 DIAGNOSIS — K219 Gastro-esophageal reflux disease without esophagitis: Secondary | ICD-10-CM | POA: Diagnosis not present

## 2020-04-19 DIAGNOSIS — R197 Diarrhea, unspecified: Secondary | ICD-10-CM | POA: Diagnosis not present

## 2020-04-19 DIAGNOSIS — K921 Melena: Secondary | ICD-10-CM

## 2020-04-19 DIAGNOSIS — R1030 Lower abdominal pain, unspecified: Secondary | ICD-10-CM

## 2020-04-19 DIAGNOSIS — K2981 Duodenitis with bleeding: Secondary | ICD-10-CM | POA: Diagnosis not present

## 2020-04-19 HISTORY — PX: COLONOSCOPY: SHX174

## 2020-04-19 HISTORY — PX: UPPER GASTROINTESTINAL ENDOSCOPY: SHX188

## 2020-04-19 MED ORDER — SODIUM CHLORIDE 0.9 % IV SOLN
500.0000 mL | Freq: Once | INTRAVENOUS | Status: DC
Start: 2020-04-19 — End: 2020-04-19

## 2020-04-19 MED ORDER — GLYCOPYRROLATE 2 MG PO TABS: 2.0000 mg | ORAL_TABLET | Freq: Two times a day (BID) | ORAL | 3 refills | Status: DC

## 2020-04-19 NOTE — Progress Notes (Signed)
Pt's states ER visit last week for "gallbadder sludge" in [place] but other than that no medical or surgical changes since previsit or office visit.  VS CW

## 2020-04-19 NOTE — Progress Notes (Signed)
Called to room to assist during endoscopic procedure.  Patient ID and intended procedure confirmed with present staff. Received instructions for my participation in the procedure from the performing physician.  

## 2020-04-19 NOTE — Op Note (Signed)
[facility] Patient Name: [name] Procedure Date: 04/19/2020 3:22 PM MRN: [id number] Endoscopist: Ladene Artist , MD Age: 45 Referring MD:  Date of Birth: 09/18/1975 Gender: Female Account #: [id number] Procedure:                Upper GI endoscopy Indications:              Lower abdominal pain, Gastroesophageal reflux                            disease, Diarrhea Medicines:                Monitored Anesthesia Care Procedure:                Pre-Anesthesia Assessment:                           - Prior to the procedure, a History and Physical                            was performed, and patient medications and                            allergies were reviewed. The patient's tolerance of                            previous anesthesia was also reviewed. The risks                            and benefits of the procedure and the sedation                            options and risks were discussed with the patient.                            All questions were answered, and informed consent                            was obtained. Prior Anticoagulants: The patient has                            taken no previous anticoagulant or antiplatelet                            agents. ASA Grade Assessment: II - A patient with                            mild systemic disease. After reviewing the risks                            and benefits, the patient was deemed in                            satisfactory condition to undergo the procedure.  After obtaining informed consent, the endoscope was                            passed under direct vision. Throughout the                            procedure, the patient's blood pressure, pulse, and                            oxygen saturations were monitored continuously. The                            Endoscope was introduced through the mouth, and                            advanced to the second part of duodenum.  The upper                            GI endoscopy was accomplished without difficulty.                            The patient tolerated the procedure well. Scope In: Scope Out: Findings:                 The examined esophagus was normal.                           Diffuse mildly erythematous mucosa without bleeding                            was found in the gastric body and in the gastric                            antrum. Biopsies were taken with a cold forceps for                            histology.                           The exam of the stomach was otherwise normal.                           The duodenal bulb and second portion of the                            duodenum were normal. Biopsies for histology were                            taken with a cold forceps for evaluation of celiac                            disease. Complications:            No immediate complications. Estimated Blood Loss:     Estimated blood loss was minimal. Impression:               -  Normal esophagus.                           - Erythematous mucosa in the gastric body and                            antrum. Biopsied.                           - Normal duodenal bulb and second portion of the                            duodenum. Biopsied. Recommendation:           - Patient has a contact number available for                            emergencies. The signs and symptoms of potential                            delayed complications were discussed with the                            patient. Return to normal activities tomorrow.                            Written discharge instructions were provided to the                            patient.                           - Resume previous diet.                           - Follow antireflux measures.                           - Continue present medications.                           - Await pathology results. Ladene Artist, MD 04/19/2020 4:05:47 PM This  report has been signed electronically.

## 2020-04-19 NOTE — Op Note (Signed)
[facility] Patient Name: [name] Procedure Date: 04/19/2020 3:23 PM MRN: [id number] Endoscopist: Ladene Artist , MD Age: 45 Referring MD:  Date of Birth: 1975/12/12 Gender: Female Account #: [id number] Procedure:                Colonoscopy Indications:              Lower abdominal pain, Clinically significant                            diarrhea of unexplained origin, Hematochezia Medicines:                Monitored Anesthesia Care Procedure:                Pre-Anesthesia Assessment:                           - Prior to the procedure, a History and Physical                            was performed, and patient medications and                            allergies were reviewed. The patient's tolerance of                            previous anesthesia was also reviewed. The risks                            and benefits of the procedure and the sedation                            options and risks were discussed with the patient.                            All questions were answered, and informed consent                            was obtained. Prior Anticoagulants: The patient has                            taken no previous anticoagulant or antiplatelet                            agents. ASA Grade Assessment: II - A patient with                            mild systemic disease. After reviewing the risks                            and benefits, the patient was deemed in                            satisfactory condition to undergo the procedure.  After obtaining informed consent, the colonoscope                            was passed under direct vision. Throughout the                            procedure, the patient's blood pressure, pulse, and                            oxygen saturations were monitored continuously. The                            Olympus PFC-[id number] (#[id number]) Colonoscope was                            introduced through the  anus and advanced to the the                            terminal ileum, with identification of the                            appendiceal orifice and IC valve. The terminal                            ileum, ileocecal valve, appendiceal orifice, and                            rectum were photographed. The quality of the bowel                            preparation was good. The colonoscopy was performed                            without difficulty. The patient tolerated the                            procedure well. Scope In: 3:33:08 PM Scope Out: 3:48:47 PM Scope Withdrawal Time: 0 hours 13 minutes 33 seconds  Total Procedure Duration: 0 hours 15 minutes 39 seconds  Findings:                 The perianal and digital rectal examinations were                            normal.                           A patchy area of mucosa in the terminal ileum was                            mildly erythematous. Biopsies were taken with a                            cold forceps for histology.  The entire examined colon appeared normal on direct                            and retroflexion views. Biopsies for histology were                            taken with a cold forceps from the entire colon for                            evaluation of microscopic colitis. Complications:            No immediate complications. Estimated blood loss:                            None. Estimated Blood Loss:     Estimated blood loss: none. Impression:               - Erythematous mucosa in the terminal ileum.                            Biopsied.                           - The entire examined colon is normal on direct and                            retroflexion views. Biopsied. Recommendation:           - Repeat colonoscopy in 10 years for screening                            purposes.                           - Patient has a contact number available for                            emergencies.  The signs and symptoms of potential                            delayed complications were discussed with the                            patient. Return to normal activities tomorrow.                            Written discharge instructions were provided to the                            patient.                           - Resume previous diet.                           - Continue present medications.                           -  DC dicyclomine                           - Glycopyrrolate 2 mg po bid, 1 year of refills.                           - Await pathology results. Ladene Artist, MD 04/19/2020 4:02:19 PM This report has been signed electronically.

## 2020-04-19 NOTE — Progress Notes (Signed)
To PACU, VSS. Report to [email or web address] 

## 2020-04-19 NOTE — Patient Instructions (Signed)
Discontinue dicyclomine.      YOU HAD AN ENDOSCOPIC PROCEDURE TODAY AT [facility]:   Refer to the procedure report that was given to you for any specific questions about what was found during the examination.  If the procedure report does not answer your questions, please call your gastroenterologist to clarify.  If you requested that your care partner not be given the details of your procedure findings, then the procedure report has been included in a sealed envelope for you to review at your convenience later.  YOU SHOULD EXPECT: Some feelings of bloating in the abdomen. Passage of more gas than usual.  Walking can help get rid of the air that was put into your GI tract during the procedure and reduce the bloating. If you had a lower endoscopy (such as a colonoscopy or flexible sigmoidoscopy) you may notice spotting of blood in your stool or on the toilet paper. If you underwent a bowel prep for your procedure, you may not have a normal bowel movement for a few days.  Please Note:  You might notice some irritation and congestion in your nose or some drainage.  This is from the oxygen used during your procedure.  There is no need for concern and it should clear up in a day or so.  SYMPTOMS TO REPORT IMMEDIATELY:   Following lower endoscopy (colonoscopy or flexible sigmoidoscopy):  Excessive amounts of blood in the stool  Significant tenderness or worsening of abdominal pains  Swelling of the abdomen that is new, acute  Fever of 100F or higher   Following upper endoscopy (EGD)  Vomiting of blood or coffee ground material  New chest pain or pain under the shoulder blades  Painful or persistently difficult swallowing  New shortness of breath  Fever of 100F or higher  Black, tarry-looking stools  For urgent or emergent issues, a gastroenterologist can be reached at any hour by calling [phone]. Do not use MyChart messaging for urgent concerns.    DIET:  We do  recommend a small meal at first, but then you may proceed to your regular diet.  Drink plenty of fluids but you should avoid alcoholic beverages for 24 hours.  ACTIVITY:  You should plan to take it easy for the rest of today and you should NOT DRIVE or use heavy machinery until tomorrow (because of the sedation medicines used during the test).    FOLLOW UP: Our staff will call the number listed on your records 48-72 hours following your procedure to check on you and address any questions or concerns that you may have regarding the information given to you following your procedure. If we do not reach you, we will leave a message.  We will attempt to reach you two times.  During this call, we will ask if you have developed any symptoms of COVID 19. If you develop any symptoms (ie: fever, flu-like symptoms, shortness of breath, cough etc.) before then, please call [phone].  If you test positive for Covid 19 in the 2 weeks post procedure, please call and report this information to [place].    If any biopsies were taken you will be contacted by phone or by letter within the next 1-3 weeks.  Please call us at [phone] if you have not heard about the biopsies in 3 weeks.    SIGNATURES/CONFIDENTIALITY: You and/or your care partner have signed paperwork which will be entered into your electronic medical record.  These signatures attest to the  fact that that the information above on your After Visit Summary has been reviewed and is understood.  Full responsibility of the confidentiality of this discharge information lies with you and/or your care-partner.

## 2020-04-21 ENCOUNTER — Telehealth: Payer: Self-pay | Admitting: *Deleted

## 2020-04-21 NOTE — Telephone Encounter (Signed)
  Follow up Call-  Call back number 04/19/2020  Post procedure Call Back phone  # [phone]  Permission to leave phone message Yes  Some recent data might be hidden    First attempt for follow up phone call. No answer at number given.  Left message on voicemail.  Only 1 attempt to reach patient d/t phone outage

## 2020-04-22 ENCOUNTER — Other Ambulatory Visit: Payer: Self-pay

## 2020-04-22 DIAGNOSIS — R932 Abnormal findings on diagnostic imaging of liver and biliary tract: Secondary | ICD-10-CM

## 2020-05-03 ENCOUNTER — Encounter: Payer: Self-pay | Admitting: Gastroenterology

## 2020-05-06 ENCOUNTER — Other Ambulatory Visit: Payer: Self-pay

## 2020-05-06 ENCOUNTER — Encounter (HOSPITAL_COMMUNITY)
Admission: RE | Admit: 2020-05-06 | Discharge: 2020-05-06 | Disposition: A | Discharge status: Home / Self Care | Payer: BC Managed Care – PPO | Source: Ambulatory Visit | Attending: Gastroenterology | Admitting: Gastroenterology

## 2020-05-06 DIAGNOSIS — R932 Abnormal findings on diagnostic imaging of liver and biliary tract: Secondary | ICD-10-CM | POA: Insufficient documentation

## 2020-05-06 MED ORDER — TECHNETIUM TC 99M MEBROFENIN IV KIT
5.4000 | PACK | Freq: Once | INTRAVENOUS | Status: AC | PRN
  Administered 2020-05-06: 5.4 via INTRAVENOUS

## 2020-05-18 ENCOUNTER — Telehealth: Payer: Self-pay | Admitting: Gastroenterology

## 2020-05-18 NOTE — Telephone Encounter (Signed)
Inbound call from husband, Lanny Hurst. Called in stating patient have been taking the medication for 2 weeks now and nothing is helping. States patient is in pain and needs help. States he talked to another Dr. In ER department in [place] and asked to if the outer lining of her stomach have been checked. Best contact number [phone]

## 2020-05-18 NOTE — Telephone Encounter (Signed)
Patient reports that she had diarrhea and vomiting that started yesterday.  Vomiting stopped last night at 5:00.  She is tolerating a soft diet.  She reports "diarrhea" every 20 minutes since yesterday.  She has only tried 2 doses of imodium.  She is advised to take additional imodium per package instructions. Patient reports that this happens every week.  She wants to know if she needs additional testing?  She does not have any antispasmodics at home.  Please advise next steps.

## 2020-05-18 NOTE — Telephone Encounter (Signed)
Patient notified

## 2020-05-18 NOTE — Telephone Encounter (Signed)
Patient has been scheduled to see Ellouise Newer, PA on 05/23/20 to discuss next options.

## 2020-05-18 NOTE — Telephone Encounter (Signed)
Pt states that she has been having diarrhea and vomiting since yesterday morning. She would like some advise.

## 2020-05-18 NOTE — Telephone Encounter (Signed)
I spoke with the patient to arrange this appointment.

## 2020-05-18 NOTE — Telephone Encounter (Signed)
Glycopyrrolate 2 mg po bid was prescribed at her 4/22 colonoscopy. Please start this now. Suspected IBS. Recommend she keeps a food diary to determine if any foods or beverages are contributing to diarrhea. No further evaluation is recommended at this time.

## 2020-05-23 ENCOUNTER — Ambulatory Visit: Payer: BC Managed Care – PPO | Admitting: Physician Assistant

## 2020-05-24 ENCOUNTER — Ambulatory Visit: Payer: BC Managed Care – PPO | Admitting: Physician Assistant

## 2020-06-27 ENCOUNTER — Emergency Department (HOSPITAL_BASED_OUTPATIENT_CLINIC_OR_DEPARTMENT_OTHER)
Admission: EM | Admit: 2020-06-27 | Discharge: 2020-06-29 | Discharge status: Against Medical Advice | Payer: BC Managed Care – PPO | Attending: Emergency Medicine | Admitting: Emergency Medicine

## 2020-06-27 ENCOUNTER — Encounter (HOSPITAL_BASED_OUTPATIENT_CLINIC_OR_DEPARTMENT_OTHER): Payer: Self-pay | Admitting: Urology

## 2020-06-27 ENCOUNTER — Other Ambulatory Visit: Payer: Self-pay

## 2020-06-27 ENCOUNTER — Emergency Department (HOSPITAL_COMMUNITY): Payer: BC Managed Care – PPO

## 2020-06-27 DIAGNOSIS — H9201 Otalgia, right ear: Secondary | ICD-10-CM | POA: Insufficient documentation

## 2020-06-27 DIAGNOSIS — I10 Essential (primary) hypertension: Secondary | ICD-10-CM | POA: Insufficient documentation

## 2020-06-27 DIAGNOSIS — R269 Unspecified abnormalities of gait and mobility: Secondary | ICD-10-CM | POA: Diagnosis not present

## 2020-06-27 DIAGNOSIS — R42 Dizziness and giddiness: Secondary | ICD-10-CM | POA: Diagnosis present

## 2020-06-27 DIAGNOSIS — R519 Headache, unspecified: Secondary | ICD-10-CM | POA: Diagnosis not present

## 2020-06-27 DIAGNOSIS — R413 Other amnesia: Secondary | ICD-10-CM | POA: Diagnosis not present

## 2020-06-27 LAB — DIFFERENTIAL
Abs Immature Granulocytes: 0.02 10*3/uL (ref 0.00–0.07)
Basophils Absolute: 0 10*3/uL (ref 0.0–0.1)
Basophils Relative: 0 %
Eosinophils Absolute: 0 10*3/uL (ref 0.0–0.5)
Eosinophils Relative: 0 %
Immature Granulocytes: 0 %
Lymphocytes Relative: 27 %
Lymphs Abs: 1.6 10*3/uL (ref 0.7–4.0)
Monocytes Absolute: 0.5 10*3/uL (ref 0.1–1.0)
Monocytes Relative: 8 %
Neutro Abs: 3.9 10*3/uL (ref 1.7–7.7)
Neutrophils Relative %: 65 %

## 2020-06-27 LAB — APTT: aPTT: 28 seconds (ref 24–36)

## 2020-06-27 LAB — PROTIME-INR
INR: 0.9 (ref 0.8–1.2)
Prothrombin Time: 12.4 seconds (ref 11.4–15.2)

## 2020-06-27 LAB — URINALYSIS, ROUTINE W REFLEX MICROSCOPIC
Bilirubin Urine: NEGATIVE
Glucose, UA: NEGATIVE mg/dL
Hgb urine dipstick: NEGATIVE
Ketones, ur: NEGATIVE mg/dL
Leukocytes,Ua: NEGATIVE
Nitrite: NEGATIVE
Protein, ur: NEGATIVE mg/dL
Specific Gravity, Urine: 1.02 (ref 1.005–1.030)
pH: 6.5 (ref 5.0–8.0)

## 2020-06-27 LAB — COMPREHENSIVE METABOLIC PANEL
ALT: 28 U/L (ref 0–44)
AST: 21 U/L (ref 15–41)
Albumin: 3.9 g/dL (ref 3.5–5.0)
Alkaline Phosphatase: 67 U/L (ref 38–126)
Anion gap: 7 (ref 5–15)
BUN: 17 mg/dL (ref 6–20)
CO2: 25 mmol/L (ref 22–32)
Calcium: 9.2 mg/dL (ref 8.9–10.3)
Chloride: 104 mmol/L (ref 98–111)
Creatinine, Ser: 0.54 mg/dL (ref 0.44–1.00)
GFR, Estimated: >60 mL/min (ref >60)
Glucose, Bld: 85 mg/dL (ref 70–99)
Potassium: 4.3 mmol/L (ref 3.5–5.1)
Sodium: 136 mmol/L (ref 135–145)
Total Bilirubin: 0.5 mg/dL (ref 0.3–1.2)
Total Protein: 6.9 g/dL (ref 6.5–8.1)

## 2020-06-27 LAB — CBC
HCT: 42.5 % (ref 36.0–46.0)
Hemoglobin: 14.4 g/dL (ref 12.0–15.0)
MCH: 29 pg (ref 26.0–34.0)
MCHC: 33.9 g/dL (ref 30.0–36.0)
MCV: 85.7 fL (ref 80.0–100.0)
Platelets: 244 10*3/uL (ref 150–400)
RBC: 4.96 MIL/uL (ref 3.87–5.11)
RDW: 13.7 % (ref 11.5–15.5)
WBC: 5.9 10*3/uL (ref 4.0–10.5)
nRBC: 0 % (ref 0.0–0.2)

## 2020-06-27 LAB — PREGNANCY, URINE: Preg Test, Ur: NEGATIVE

## 2020-06-27 MED ORDER — MECLIZINE HCL 25 MG PO TABS
25.0000 mg | ORAL_TABLET | Freq: Once | ORAL | Status: AC
  Administered 2020-06-27: 25 mg via ORAL
  Filled 2020-06-27: qty 1

## 2020-06-27 MED ORDER — LORAZEPAM 1 MG PO TABS
1.0000 mg | ORAL_TABLET | Freq: Once | ORAL | Status: AC
  Administered 2020-06-27: 1 mg via ORAL
  Filled 2020-06-27: qty 1

## 2020-06-27 NOTE — ED Provider Notes (Signed)
Emergency Medicine Provider Triage Evaluation Note  [name] , a 45 y.o. female  was evaluated in triage.  Pt complains of dizziness, changes in hearing, and left hand weakness x 4 days. Coming from [facility] for MRI brain and MRA head, per neurology. Hx of stroke, not anticoagulated.  Review of Systems  Positive: Dizziness, tinnitus, left arm weakeness, HA Negative: Blurry or double vision, disequlibrium, ataxia  Physical Exam  BP (!) 159/86 (BP Location: Left Arm)   Pulse 66   Temp 98.3 F (36.8 C)   Resp 18   Ht 5' 4"  (1.626 m)   Wt 108.9 kg   LMP 06/29/2017 (Exact Date)   SpO2 96%   BMI 41.20 kg/m  Gen:   Awake, no distress   Resp:  Normal effort  MSK:   Moves extremities without difficulty  Other:  Symmetric strength in grip, PERRL, EOMI.   Medical Decision Making  Medically screening exam initiated at 1:26 PM.  Appropriate orders placed.  [name] was informed that the remainder of the evaluation will be completed by another provider, this initial triage assessment does not replace that evaluation, and the importance of remaining in the ED until their evaluation is complete.  This chart was dictated using voice recognition software, Dragon. Despite the best efforts of this provider to proofread and correct errors, errors may still occur which can change documentation meaning.  Imaging ordered per neurology recommendation/ preceding [facility] ED provider note.    [name] 06/27/20 1327    Daleen Bo, MD 06/27/20 1751

## 2020-06-27 NOTE — ED Notes (Signed)
Pt stated they were going home. Pt educated on staying to been seen. IV removed. Moving OTF.

## 2020-06-27 NOTE — ED Notes (Signed)
ED Provider at bedside. 

## 2020-06-27 NOTE — ED Notes (Signed)
Pt alert and oriented, pt describes unsteady gait, dizziness & heaviness & ringing  in bilateral ears.  States she feels "drunk". Neuro intact, no hx of any seizures
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
## 2020-06-27 NOTE — ED Notes (Signed)
Na FOR VITALS

## 2020-06-27 NOTE — ED Triage Notes (Signed)
States dizziness, unsteady gait x 3 days, states feels like hearing is muffled, h/o stroke 4 years. Denies any pain states "feel like im drunk and numb".

## 2020-06-27 NOTE — Discharge Instructions (Addendum)
You have been evaluated at the [place] [place] emergency department by [facility] PA-C.  Myself as well as neurology recommend that you have an MRI as well as an MRA of your brain performed.  This would require that you be transferred to the [facility] emergency department.  Please go to the front desk of the [facility] emergency department and tell them you are being transferred to their emergency department from [facility] for further imaging.  This is been discussed with Dr. Dina Rich in the emergency department who is aware that you are coming.  If she had any difficulties, please call the [facility] emergency department or at the med center [facility] emergency department immediately.

## 2020-06-27 NOTE — ED Notes (Signed)
Orders received from [place], PA-C that pt may be transported by spouse to [facility] and to leave IV in place

## 2020-07-04 DIAGNOSIS — H9122 Sudden idiopathic hearing loss, left ear: Secondary | ICD-10-CM | POA: Insufficient documentation

## 2020-07-04 DIAGNOSIS — H8302 Labyrinthitis, left ear: Secondary | ICD-10-CM | POA: Insufficient documentation

## 2020-07-18 DIAGNOSIS — I639 Cerebral infarction, unspecified: Secondary | ICD-10-CM | POA: Insufficient documentation

## 2020-07-21 ENCOUNTER — Other Ambulatory Visit: Payer: Self-pay | Admitting: Otolaryngology

## 2020-07-21 DIAGNOSIS — H9122 Sudden idiopathic hearing loss, left ear: Secondary | ICD-10-CM

## 2020-07-29 ENCOUNTER — Ambulatory Visit
Admission: RE | Admit: 2020-07-29 | Discharge: 2020-07-29 | Disposition: A | Discharge status: Home / Self Care | Payer: BC Managed Care – PPO | Source: Ambulatory Visit | Attending: Otolaryngology | Admitting: Otolaryngology

## 2020-07-29 ENCOUNTER — Other Ambulatory Visit: Payer: Self-pay

## 2020-07-29 DIAGNOSIS — H9122 Sudden idiopathic hearing loss, left ear: Secondary | ICD-10-CM

## 2020-07-29 MED ORDER — GADOBENATE DIMEGLUMINE 529 MG/ML IV SOLN
20.0000 mL | Freq: Once | INTRAVENOUS | Status: AC | PRN
  Administered 2020-07-29: 20 mL via INTRAVENOUS

## 2020-09-12 ENCOUNTER — Other Ambulatory Visit: Payer: Self-pay | Admitting: Physician Assistant

## 2021-03-14 ENCOUNTER — Other Ambulatory Visit: Payer: Self-pay | Admitting: Family Medicine

## 2021-03-14 DIAGNOSIS — Z1231 Encounter for screening mammogram for malignant neoplasm of breast: Secondary | ICD-10-CM

## 2021-03-17 ENCOUNTER — Ambulatory Visit (HOSPITAL_COMMUNITY)
Admission: EM | Admit: 2021-03-17 | Discharge: 2021-03-18 | Disposition: A | Discharge status: Other Acute Inpatient Hospital | Payer: BC Managed Care – PPO | Attending: Behavioral Health | Admitting: Behavioral Health

## 2021-03-17 DIAGNOSIS — F419 Anxiety disorder, unspecified: Secondary | ICD-10-CM

## 2021-03-17 DIAGNOSIS — F411 Generalized anxiety disorder: Secondary | ICD-10-CM | POA: Diagnosis not present

## 2021-03-17 DIAGNOSIS — F332 Major depressive disorder, recurrent severe without psychotic features: Secondary | ICD-10-CM

## 2021-03-17 DIAGNOSIS — F4310 Post-traumatic stress disorder, unspecified: Secondary | ICD-10-CM | POA: Diagnosis not present

## 2021-03-17 DIAGNOSIS — Z79899 Other long term (current) drug therapy: Secondary | ICD-10-CM | POA: Insufficient documentation

## 2021-03-17 DIAGNOSIS — R45851 Suicidal ideations: Secondary | ICD-10-CM | POA: Diagnosis present

## 2021-03-17 DIAGNOSIS — Z20822 Contact with and (suspected) exposure to covid-19: Secondary | ICD-10-CM | POA: Diagnosis not present

## 2021-03-17 LAB — POCT URINE DRUG SCREEN - MANUAL ENTRY (I-SCREEN)
POC Amphetamine UR: NOT DETECTED
POC Buprenorphine (BUP): NOT DETECTED
POC Cocaine UR: NOT DETECTED
POC Marijuana UR: NOT DETECTED
POC Methadone UR: NOT DETECTED
POC Methamphetamine UR: NOT DETECTED
POC Morphine: NOT DETECTED
POC Oxazepam (BZO): NOT DETECTED
POC Oxycodone UR: NOT DETECTED
POC Secobarbital (BAR): NOT DETECTED

## 2021-03-17 LAB — CBC WITH DIFFERENTIAL/PLATELET
Abs Immature Granulocytes: 0.02 10*3/uL (ref 0.00–0.07)
Basophils Absolute: 0 10*3/uL (ref 0.0–0.1)
Basophils Relative: 0 %
Eosinophils Absolute: 0 10*3/uL (ref 0.0–0.5)
Eosinophils Relative: 0 %
HCT: 41.4 % (ref 36.0–46.0)
Hemoglobin: 13.8 g/dL (ref 12.0–15.0)
Immature Granulocytes: 0 %
Lymphocytes Relative: 23 %
Lymphs Abs: 1.3 10*3/uL (ref 0.7–4.0)
MCH: 27.8 pg (ref 26.0–34.0)
MCHC: 33.3 g/dL (ref 30.0–36.0)
MCV: 83.3 fL (ref 80.0–100.0)
Monocytes Absolute: 0.5 10*3/uL (ref 0.1–1.0)
Monocytes Relative: 9 %
Neutro Abs: 3.9 10*3/uL (ref 1.7–7.7)
Neutrophils Relative %: 68 %
Platelets: 264 10*3/uL (ref 150–400)
RBC: 4.97 MIL/uL (ref 3.87–5.11)
RDW: 13.6 % (ref 11.5–15.5)
WBC: 5.8 10*3/uL (ref 4.0–10.5)
nRBC: 0 % (ref 0.0–0.2)

## 2021-03-17 LAB — COMPREHENSIVE METABOLIC PANEL
ALT: 25 U/L (ref 0–44)
AST: 23 U/L (ref 15–41)
Albumin: 3.6 g/dL (ref 3.5–5.0)
Alkaline Phosphatase: 75 U/L (ref 38–126)
Anion gap: 8 (ref 5–15)
BUN: 13 mg/dL (ref 6–20)
CO2: 25 mmol/L (ref 22–32)
Calcium: 8.9 mg/dL (ref 8.9–10.3)
Chloride: 105 mmol/L (ref 98–111)
Creatinine, Ser: 0.55 mg/dL (ref 0.44–1.00)
GFR, Estimated: >60 mL/min (ref >60)
Glucose, Bld: 137 mg/dL — ABNORMAL HIGH (ref 70–99)
Potassium: 3.7 mmol/L (ref 3.5–5.1)
Sodium: 138 mmol/L (ref 135–145)
Total Bilirubin: 0.1 mg/dL — ABNORMAL LOW (ref 0.3–1.2)
Total Protein: 6 g/dL — ABNORMAL LOW (ref 6.5–8.1)

## 2021-03-17 LAB — HEMOGLOBIN A1C
Hgb A1c MFr Bld: 4.8 % (ref 4.8–5.6)
Mean Plasma Glucose: 91.06 mg/dL

## 2021-03-17 LAB — LIPID PANEL
Cholesterol: 140 mg/dL (ref 0–200)
HDL: 46 mg/dL (ref >40)
LDL Cholesterol: 79 mg/dL (ref 0–99)
Total CHOL/HDL Ratio: 3 RATIO
Triglycerides: 77 mg/dL (ref <150)
VLDL: 15 mg/dL (ref 0–40)

## 2021-03-17 LAB — POC SARS CORONAVIRUS 2 AG: SARSCOV2ONAVIRUS 2 AG: NEGATIVE

## 2021-03-17 LAB — POC SARS CORONAVIRUS 2 AG -  ED: SARS Coronavirus 2 Ag: NEGATIVE

## 2021-03-17 LAB — TSH: TSH: 2.867 u[IU]/mL (ref 0.350–4.500)

## 2021-03-17 LAB — ETHANOL: Alcohol, Ethyl (B): <10 mg/dL (ref <10)

## 2021-03-17 LAB — RESP PANEL BY RT-PCR (FLU A&B, COVID) ARPGX2
Influenza A by PCR: NEGATIVE
Influenza B by PCR: NEGATIVE
SARS Coronavirus 2 by RT PCR: NEGATIVE

## 2021-03-17 LAB — POCT PREGNANCY, URINE: Preg Test, Ur: NEGATIVE

## 2021-03-17 MED ORDER — BUSPIRONE HCL 5 MG PO TABS
5.0000 mg | ORAL_TABLET | Freq: Two times a day (BID) | ORAL | Status: DC
  Administered 2021-03-17: 5 mg via ORAL
  Filled 2021-03-17: qty 1

## 2021-03-17 MED ORDER — ALPRAZOLAM 0.5 MG PO TABS
0.5000 mg | ORAL_TABLET | Freq: Every day | ORAL | Status: DC | PRN
  Administered 2021-03-17: 0.5 mg via ORAL
  Filled 2021-03-17: qty 1

## 2021-03-17 MED ORDER — NALTREXONE HCL 50 MG PO TABS
50.0000 mg | ORAL_TABLET | Freq: Every day | ORAL | Status: DC
Start: 2021-03-18 — End: 2021-03-18

## 2021-03-17 MED ORDER — PRAZOSIN HCL 2 MG PO CAPS
2.0000 mg | ORAL_CAPSULE | Freq: Every day | ORAL | Status: DC
  Administered 2021-03-17: 2 mg via ORAL
  Filled 2021-03-17: qty 1

## 2021-03-17 MED ORDER — ACETAMINOPHEN 325 MG PO TABS
650.0000 mg | ORAL_TABLET | Freq: Four times a day (QID) | ORAL | Status: DC | PRN
  Administered 2021-03-17: 650 mg via ORAL
  Filled 2021-03-17: qty 2

## 2021-03-17 MED ORDER — ALUM & MAG HYDROXIDE-SIMETH 200-200-20 MG/5ML PO SUSP: 30.0000 mL | ORAL | Status: DC | PRN

## 2021-03-17 MED ORDER — PREGABALIN 75 MG PO CAPS
75.0000 mg | ORAL_CAPSULE | Freq: Two times a day (BID) | ORAL | Status: DC
  Administered 2021-03-17: 75 mg via ORAL
  Filled 2021-03-17: qty 1

## 2021-03-17 MED ORDER — TRAZODONE HCL 50 MG PO TABS: 50.0000 mg | ORAL_TABLET | Freq: Every evening | ORAL | Status: DC | PRN

## 2021-03-17 MED ORDER — MAGNESIUM HYDROXIDE 400 MG/5ML PO SUSP: 30.0000 mL | Freq: Every day | ORAL | Status: DC | PRN

## 2021-03-17 MED ORDER — PREGABALIN 75 MG PO CAPS: 75.0000 mg | ORAL_CAPSULE | Freq: Once | ORAL | Status: DC

## 2021-03-17 MED ORDER — VENLAFAXINE HCL ER 150 MG PO CP24: 225.0000 mg | ORAL_CAPSULE | Freq: Every day | ORAL | Status: DC

## 2021-03-17 NOTE — ED Notes (Signed)
Gave report to Rudy RN@[facility]  adult unit  ?

## 2021-03-17 NOTE — Progress Notes (Signed)
Unable to draw labs at this time. ?
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
## 2021-03-17 NOTE — Progress Notes (Signed)
[facility] LCSW Progress Note ?  ?03/17/2021    10:31 PM ? ?Casi Westerfeld  ? ?[id number]  ? ?Type of Contact and Topic:  Psychiatric Bed Placement  ? ?Pt accepted to [facility] 405-2    ? ?Patient meets inpatient criteria per Darrol Angel, NP ? ?The attending provider will be Viann Fish, MD  ? ?Call report to [phone]   ? ?Lily Kocher, LPN @ [facility] notified.    ? ?Pt scheduled  to arrive at [facility]. Please send voluntary consent prior to transporting this Pt.  ? ? ?Mariea Clonts, MSW, LCSW-A  ?10:32 PM 03/17/2021   ?  ? ?  ?  ? ? ? ? ?  ?

## 2021-03-17 NOTE — Progress Notes (Signed)
Pt is admitted to continuous observation due to passive SI. Pt verbally contracts for safety on the unit. Pt is alert and oriented. Pt is ambulatory and is oriented to staff/unit. Pt was cooperative with labs and skin assessment. Pt denies pain and current HI/AVH. Staff will monitor for pt's safety. ?

## 2021-03-17 NOTE — ED Notes (Signed)
Pt requesting medication for a headache 4/10. Leandro Reasoner, NP notified. ?

## 2021-03-17 NOTE — ED Notes (Addendum)
Pt currently on the phone with her husband and requesting this RN to speak with her husband. Pt gave verbal consent for RN to speak with her husband over the phone. Pt's husband states he is concerned that pt is going to [facility] because pt's son was previously there and "this may trigger some things for her." Pt's husband asks if pt can just stay at [facility] overnight. RN asked pt if she had any concerns with going to [facility] and pt stated that she didn't. Pt states, "I'm fine with going there, I need to be somewhere for a few days." Voluntary admission and consent for treatment form signed. ?

## 2021-03-17 NOTE — ED Notes (Signed)
Pt A&O x 4. Laying in bed calm and cooeprative. Pain has resolved with tylenol given (see Mar). Will continue to monitor for safety ?

## 2021-03-17 NOTE — Progress Notes (Signed)
RN attempt at specimen collection unsuccessful. Patient states a vein finder is usually used on her in the hospital. ?
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
## 2021-03-18 ENCOUNTER — Encounter (HOSPITAL_COMMUNITY): Payer: Self-pay | Admitting: Nurse Practitioner

## 2021-03-18 ENCOUNTER — Inpatient Hospital Stay (HOSPITAL_COMMUNITY)
Admission: RE | Admit: 2021-03-18 | Discharge: 2021-03-21 | DRG: 885 | Disposition: A | Discharge status: Home / Self Care | Payer: BC Managed Care – PPO | Source: Intra-hospital | Attending: Emergency Medicine | Admitting: Emergency Medicine

## 2021-03-18 ENCOUNTER — Other Ambulatory Visit: Payer: Self-pay

## 2021-03-18 DIAGNOSIS — M797 Fibromyalgia: Secondary | ICD-10-CM | POA: Diagnosis present

## 2021-03-18 DIAGNOSIS — R45851 Suicidal ideations: Secondary | ICD-10-CM | POA: Diagnosis present

## 2021-03-18 DIAGNOSIS — F5081 Binge eating disorder: Secondary | ICD-10-CM | POA: Diagnosis present

## 2021-03-18 DIAGNOSIS — Z79899 Other long term (current) drug therapy: Secondary | ICD-10-CM

## 2021-03-18 DIAGNOSIS — I10 Essential (primary) hypertension: Secondary | ICD-10-CM | POA: Diagnosis present

## 2021-03-18 DIAGNOSIS — F515 Nightmare disorder: Secondary | ICD-10-CM | POA: Diagnosis present

## 2021-03-18 DIAGNOSIS — G4700 Insomnia, unspecified: Secondary | ICD-10-CM | POA: Diagnosis present

## 2021-03-18 DIAGNOSIS — F411 Generalized anxiety disorder: Secondary | ICD-10-CM | POA: Diagnosis present

## 2021-03-18 DIAGNOSIS — F4310 Post-traumatic stress disorder, unspecified: Secondary | ICD-10-CM | POA: Diagnosis present

## 2021-03-18 DIAGNOSIS — F332 Major depressive disorder, recurrent severe without psychotic features: Principal | ICD-10-CM | POA: Diagnosis present

## 2021-03-18 DIAGNOSIS — E7800 Pure hypercholesterolemia, unspecified: Secondary | ICD-10-CM | POA: Diagnosis present

## 2021-03-18 DIAGNOSIS — E785 Hyperlipidemia, unspecified: Secondary | ICD-10-CM | POA: Diagnosis present

## 2021-03-18 DIAGNOSIS — K219 Gastro-esophageal reflux disease without esophagitis: Secondary | ICD-10-CM | POA: Diagnosis present

## 2021-03-18 DIAGNOSIS — G43909 Migraine, unspecified, not intractable, without status migrainosus: Secondary | ICD-10-CM | POA: Diagnosis present

## 2021-03-18 LAB — LIPID PANEL
Cholesterol: 135 mg/dL (ref 0–200)
HDL: 42 mg/dL (ref >40)
LDL Cholesterol: 81 mg/dL (ref 0–99)
Total CHOL/HDL Ratio: 3.2 RATIO
Triglycerides: 59 mg/dL (ref <150)
VLDL: 12 mg/dL (ref 0–40)

## 2021-03-18 LAB — TSH: TSH: 3.419 u[IU]/mL (ref 0.350–4.500)

## 2021-03-18 MED ORDER — TRAZODONE HCL 50 MG PO TABS
50.0000 mg | ORAL_TABLET | Freq: Every evening | ORAL | Status: DC | PRN
Start: 2021-03-18 — End: 2021-03-18

## 2021-03-18 MED ORDER — VENLAFAXINE HCL 75 MG PO TABS: 75.0000 mg | ORAL_TABLET | Freq: Two times a day (BID) | ORAL | Status: DC

## 2021-03-18 MED ORDER — DULOXETINE HCL 30 MG PO CPEP
30.0000 mg | ORAL_CAPSULE | Freq: Every day | ORAL | Status: DC
  Administered 2021-03-18 – 2021-03-19 (×2): 30 mg via ORAL
  Filled 2021-03-18 (×6): qty 1

## 2021-03-18 MED ORDER — PREGABALIN 75 MG PO CAPS
75.0000 mg | ORAL_CAPSULE | Freq: Every day | ORAL | Status: DC
  Administered 2021-03-18: 75 mg via ORAL
  Filled 2021-03-18: qty 1

## 2021-03-18 MED ORDER — HYDROXYZINE HCL 25 MG PO TABS
25.0000 mg | ORAL_TABLET | Freq: Three times a day (TID) | ORAL | Status: DC | PRN
  Administered 2021-03-18 – 2021-03-21 (×4): 25 mg via ORAL
  Filled 2021-03-18 (×4): qty 1

## 2021-03-18 MED ORDER — VENLAFAXINE HCL ER 75 MG PO CP24
225.0000 mg | ORAL_CAPSULE | Freq: Every day | ORAL | Status: DC
  Filled 2021-03-18 (×3): qty 3

## 2021-03-18 MED ORDER — BUSPIRONE HCL 5 MG PO TABS
10.0000 mg | ORAL_TABLET | Freq: Two times a day (BID) | ORAL | Status: DC
  Administered 2021-03-19 – 2021-03-21 (×5): 10 mg via ORAL
  Filled 2021-03-18 (×3): qty 2
  Filled 2021-03-18: qty 1
  Filled 2021-03-18 (×6): qty 2
  Filled 2021-03-18: qty 1
  Filled 2021-03-18: qty 2

## 2021-03-18 MED ORDER — ATORVASTATIN CALCIUM 10 MG PO TABS
20.0000 mg | ORAL_TABLET | Freq: Every day | ORAL | Status: DC
  Administered 2021-03-18 – 2021-03-21 (×4): 20 mg via ORAL
  Filled 2021-03-18: qty 1
  Filled 2021-03-18 (×5): qty 2
  Filled 2021-03-18: qty 1
  Filled 2021-03-18: qty 2

## 2021-03-18 MED ORDER — VENLAFAXINE HCL ER 150 MG PO CP24
150.0000 mg | ORAL_CAPSULE | Freq: Every day | ORAL | Status: DC
  Filled 2021-03-18: qty 1

## 2021-03-18 MED ORDER — ALUM & MAG HYDROXIDE-SIMETH 200-200-20 MG/5ML PO SUSP: 30.0000 mL | ORAL | Status: DC | PRN

## 2021-03-18 MED ORDER — NALTREXONE HCL 50 MG PO TABS
25.0000 mg | ORAL_TABLET | Freq: Every day | ORAL | Status: DC
  Administered 2021-03-18: 25 mg via ORAL
  Filled 2021-03-18 (×3): qty 1

## 2021-03-18 MED ORDER — VENLAFAXINE HCL ER 150 MG PO CP24
150.0000 mg | ORAL_CAPSULE | Freq: Every day | ORAL | Status: AC
  Administered 2021-03-19: 150 mg via ORAL
  Filled 2021-03-18 (×2): qty 1

## 2021-03-18 MED ORDER — TRAZODONE HCL 50 MG PO TABS
25.0000 mg | ORAL_TABLET | Freq: Every evening | ORAL | Status: DC | PRN
  Administered 2021-03-18 – 2021-03-20 (×3): 25 mg via ORAL
  Filled 2021-03-18 (×4): qty 1

## 2021-03-18 MED ORDER — MAGNESIUM HYDROXIDE 400 MG/5ML PO SUSP
30.0000 mL | Freq: Every day | ORAL | Status: DC | PRN
Start: 2021-03-18 — End: 2021-03-21

## 2021-03-18 MED ORDER — VENLAFAXINE HCL ER 150 MG PO CP24
150.0000 mg | ORAL_CAPSULE | Freq: Once | ORAL | Status: AC
  Administered 2021-03-18: 150 mg via ORAL
  Filled 2021-03-18: qty 1

## 2021-03-18 MED ORDER — ACETAMINOPHEN 325 MG PO TABS
650.0000 mg | ORAL_TABLET | Freq: Four times a day (QID) | ORAL | Status: DC | PRN
  Administered 2021-03-20 – 2021-03-21 (×2): 650 mg via ORAL
  Filled 2021-03-18 (×2): qty 2

## 2021-03-18 MED ORDER — PRAZOSIN HCL 1 MG PO CAPS
1.0000 mg | ORAL_CAPSULE | Freq: Every day | ORAL | Status: DC
  Administered 2021-03-18 – 2021-03-20 (×3): 1 mg via ORAL
  Filled 2021-03-18 (×7): qty 1
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
## 2021-03-18 NOTE — [facility] Group Notes (Signed)
Goals Group ?3/4//2023 ? ? ?Group Focus: affirmation, clarity of thought, and goals/reality orientation ?Treatment Modality:  Psychoeducation ?Interventions utilized were assignment, group exercise, and support ?Purpose: To be able to understand and verbalize the reason for their admission to the hospital. To understand that the medication helps with their chemical imbalance but they also need to work on their choices in life. To be challenged to develop [name] list of 30 positives about themselves. Also introduce the concept that "feelings" are not reality. ? ?Participation Level:  Active ? ?Participation Quality:  Appropriate ? ?Affect:  Appropriate ? ?Cognitive:  Appropriate ? ?Insight:  Improving ? ?Engagement in Group:  Engaged ? ?Additional Comments:  Rates her energy at [name] 0/10. Anwered question. Did not volunteer anything ? ?[name] ?

## 2021-03-18 NOTE — Tx Team (Signed)
Initial Treatment Plan ?03/18/2021 ?3:50 AM ?[name] ?VQO:[id number] ? ? ? ?PATIENT STRESSORS: ?Marital or family conflict   ?Occupational concerns   ?Traumatic event   ? ? ?PATIENT STRENGTHS: ?Average or above average intelligence  ?Communication skills  ?Supportive family/friends  ? ? ?PATIENT IDENTIFIED PROBLEMS: ?Suicidal Ideation  ?Depression   ?Anxiety  ?(Coping mechanisms)  ?  ?  ?  ?  ?  ?  ? ?DISCHARGE CRITERIA:  ?Improved stabilization in mood, thinking, and/or behavior ?Motivation to continue treatment in a less acute level of care ?Verbal commitment to aftercare and medication compliance ? ?PRELIMINARY DISCHARGE PLAN: ?Outpatient therapy ?Return to previous living arrangement ?Return to previous work or school arrangements ? ?PATIENT/FAMILY INVOLVEMENT: ?This treatment plan has been presented to and reviewed with the patient, [name], and/or family member.  The patient and family have been given the opportunity to ask questions and make suggestions. ? ?Arvid Right, RN ?03/18/2021, 3:50 AM ?

## 2021-03-18 NOTE — Progress Notes (Signed)
D. Pt presented as tearful upon initial approach, stated that she was missing her family, but she knew that she needed to be here. Pt rated her depression, hopelessness and anxiety a 07/19/08, respectively. Pt reported that she slept poorly last night because she didn't have her medication that she usually takes for nightmares (Prazosin). Pt has been visible in the milieu, and observed attending groups. Pt currently denies SI/HI and AVH  ?A. Labs and vitals monitored. Pt supported emotionally and encouraged to express concerns and ask questions.   ?R. Pt remains safe with 15 minute checks. Will continue POC. ? ?  ?

## 2021-03-18 NOTE — [facility] Group Notes (Signed)
[facility] Group Notes:  (Nursing/MHT/Case Management/Adjunct) ? ?Date:  03/18/2021  ?Time:  9:31 PM ? ?Type of Therapy:  Group Therapy ? ?Participation Level:  Active ? ?Participation Quality:  Appropriate ? ?Affect:  Appropriate ? ?Cognitive:  Appropriate ? ?Insight:  Appropriate ? ?Engagement in Group:  Engaged ? ?Modes of Intervention:  Activity ? ?Summary of Progress/Problems: ? ?Clarene Critchley ?03/18/2021, 9:31 PM ?
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
## 2021-03-18 NOTE — BHH Group Notes (Signed)
Psychoeducational Group Note ? ? ? ?Date:03/18/21 ?Time: 1300-1400 ? ? ? ?Purpose of Group: . The group focus' on teaching patients on how to identify their needs and their Life Skills:  A group where two lists are made. What people need and what are things that we do that are unhealthy. The lists are developed by the patients and it is explained that we often do the actions that are not healthy to get our list of needs met. ? ?Goal:: to develop the coping skills needed to get their needs met ? ?Participation Level:  Active ? ?Participation Quality:  Appropriate ? ?Affect:  Appropriate ? ?Cognitive:  Oriented ? ?Insight:  Improving ? ?Engagement in Group:  Engaged ? ?Additional Comments: Pt rates her energy at a 5/10. Participated fully in the the collection of words for needs and unhealthy habits ? ?Bryson Dames A ? ?
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
## 2021-03-18 NOTE — BHH Group Notes (Signed)
Pt attended and participated in orientation/goals group ?

## 2021-03-19 DIAGNOSIS — F4310 Post-traumatic stress disorder, unspecified: Secondary | ICD-10-CM | POA: Diagnosis present

## 2021-03-19 DIAGNOSIS — F332 Major depressive disorder, recurrent severe without psychotic features: Principal | ICD-10-CM

## 2021-03-19 MED ORDER — PREGABALIN 75 MG PO CAPS
75.0000 mg | ORAL_CAPSULE | Freq: Two times a day (BID) | ORAL | Status: DC
  Administered 2021-03-19 – 2021-03-21 (×5): 75 mg via ORAL
  Filled 2021-03-19 (×5): qty 1

## 2021-03-19 MED ORDER — PANTOPRAZOLE SODIUM 40 MG PO TBEC
40.0000 mg | DELAYED_RELEASE_TABLET | Freq: Every day | ORAL | Status: DC
  Administered 2021-03-19 – 2021-03-21 (×3): 40 mg via ORAL
  Filled 2021-03-19 (×6): qty 1

## 2021-03-19 MED ORDER — NALTREXONE HCL 50 MG PO TABS
50.0000 mg | ORAL_TABLET | Freq: Every day | ORAL | Status: DC
  Administered 2021-03-19 – 2021-03-21 (×3): 50 mg via ORAL
  Filled 2021-03-19 (×6): qty 1

## 2021-03-19 MED ORDER — AMLODIPINE BESYLATE 10 MG PO TABS
10.0000 mg | ORAL_TABLET | Freq: Every day | ORAL | Status: DC
  Administered 2021-03-19 – 2021-03-21 (×3): 10 mg via ORAL
  Filled 2021-03-19 (×7): qty 1

## 2021-03-19 MED ORDER — LOPERAMIDE HCL 2 MG PO CAPS
2.0000 mg | ORAL_CAPSULE | ORAL | Status: DC | PRN
  Administered 2021-03-19 – 2021-03-20 (×2): 2 mg via ORAL
  Filled 2021-03-19 (×2): qty 1

## 2021-03-19 MED ORDER — ONDANSETRON HCL 4 MG PO TABS
4.0000 mg | ORAL_TABLET | Freq: Three times a day (TID) | ORAL | Status: DC | PRN
  Administered 2021-03-19 – 2021-03-21 (×3): 4 mg via ORAL
  Filled 2021-03-19 (×3): qty 1

## 2021-03-19 MED ORDER — VENLAFAXINE HCL ER 75 MG PO CP24
75.0000 mg | ORAL_CAPSULE | Freq: Every day | ORAL | Status: AC
  Administered 2021-03-20 – 2021-03-21 (×2): 75 mg via ORAL
  Filled 2021-03-19 (×3): qty 1

## 2021-03-19 MED ORDER — DULOXETINE HCL 60 MG PO CPEP
60.0000 mg | ORAL_CAPSULE | Freq: Every day | ORAL | Status: DC
  Administered 2021-03-20 – 2021-03-21 (×2): 60 mg via ORAL
  Filled 2021-03-19 (×6): qty 1

## 2021-03-19 NOTE — Progress Notes (Signed)
The focus of this group is to help patients review their daily goal of treatment and discuss progress on daily workbooks. ? ?Pt attended the evening group session and responded to all discussion prompts from the [place]. Pt shared that today was a good day on the unit, the highlight of which was a visit from her husband. "I also got to talk to my best friend on the phone, which was so great." ? ?Pt told that her goal for the coming week was to "get my medications sorted out so that I can get home to my family." ? ?Pt rated her day a 7 out of 10 and her affect was appropriate. ?

## 2021-03-19 NOTE — BHH Group Notes (Signed)
Adult Psychoeducational Group Not ?Date:  03/19/2021 ?Time:  5056-9794 ?Group Topic/Focus: PROGRESSIVE RELAXATION. A group where deep breathing is taught and tensing and relaxation muscle groups is used. Imagery is used as well.  Pts are asked to imagine 3 pillars that hold them up when they are not able to hold themselves up and to share that with the group. ? ?Participation Level:  Active ? ?Participation Quality:  Appropriate ? ?Affect:  Appropriate ? ?Cognitive:  Oriented ? ?Insight: Improving ? ?Engagement in Group:  Engaged ? ?Modes of Intervention:  Activity, Discussion, Education, and Support ? ?Additional Comments:  Pt rates her energy at a 5/10 , What holds her up is her faith, her family and hope. ? ?Bryson Dames A ? ? ?

## 2021-03-19 NOTE — Progress Notes (Signed)
Pt complained of diarrhea one time episode, pt encouraged to let the staff know if it continues, will continue to monitor. ?
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
## 2021-03-19 NOTE — BHH Counselor (Signed)
Clinical Social Work Note ? ?Weekend CSW called 03/19/21 at 4pm to complete SPE information with pt's husband Jacey Eckerson at [phone]. He did not answer, and so HIPPA compliant Voicemail was left with a return number. ? ?[place], [place] ?03/19/2021 4:22 PM  ? ?
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
## 2021-03-19 NOTE — Progress Notes (Signed)
?   03/18/21 2300  ?Psych Admission Type (Psych Patients Only)  ?Admission Status Voluntary  ?Psychosocial Assessment  ?Patient Complaints None  ?Eye Contact Fair  ?Facial Expression Flat  ?Affect Appropriate to circumstance  ?Speech Logical/coherent  ?Interaction Assertive  ?Motor Activity Slow  ?Appearance/Hygiene Improved  ?Behavior Characteristics Appropriate to situation;Cooperative  ?Mood Pleasant  ?Thought Process  ?Coherency WDL  ?Content WDL  ?Delusions WDL  ?Perception WDL  ?Hallucination None reported or observed  ?Judgment WDL  ?Confusion None  ?Danger to Self  ?Current suicidal ideation? Denies  ?Self-Injurious Behavior No self-injurious ideation or behavior indicators observed or expressed   ?Agreement Not to Harm Self Yes  ?Description of Agreement verbal  ?Danger to Others  ?Danger to Others None reported or observed  ? ? ?

## 2021-03-19 NOTE — Progress Notes (Signed)
D. Pt presented with improved mood this am, stating, "I feel better than yesterday." Pt reported having an episode of diarrhea last pm, that pt felt was from having had ice cream. Other than that one episode, pt reported that she slept well. Per pt's self inventory, pt rated her depression, hopelessness and anxiety a 3/0/6, respectively. Pt reported that her goal today was to "participate and listen in groups so that she can add more coping skills." Pt currently denies SI/HI and AVH Pt observed attending group this am. A. Labs and vitals monitored. Pt given and educated on medications. Pt supported emotionally and encouraged to express concerns and ask questions.   ?R. Pt remains safe with 15 minute checks. Will continue POC. ? ?  ?

## 2021-03-19 NOTE — Discharge Instructions (Signed)
If you wish to follow up with Stephannie Peters: ? ?[facility] ?[address] ?[address] ?[place],  [address] ?[phone] ?

## 2021-03-19 NOTE — Group Note (Signed)
?  [facility] LCSW Group Therapy Note ? ?Date/Time:  03/19/2021 10:00AM-11:00AM ? ?Type of Therapy and Topic:  Group Therapy:  Self-Care after Hospitalization ? ?Participation Level:  Active  ? ?Description of Group ?This process group involved patients discussing how they plan to take care of themselves in a better manner when they get home from the hospital.  The group started with patients listing one healthy and one unhealthy way they took care of themselves prior to hospitalization.  A discussion ensued about the differences in healthy and unhealthy coping skills.  Group members shared ideas about making changes when they return home so that they can stay well and in recovery.  The white board was used to list ideas so that patients can continue to see these ideas throughout the day. ? ?Therapeutic Goals ?Patient will identify and describe one healthy and one unhealthy coping technique used prior to hospitalization ?Patient will participate in generating ideas about healthy self-care options when they return to the community ?Patients will be supportive of one another and receive said support from others ?Patient will identify one healthy self-care activity to add to his/her post-hospitalization life that can help in recovery ? ?Summary of Patient Progress:  The patient expressed that prior to hospitalization some healthy self-care activity that she engaged in was sleeping and getting rest, while unhealthy self-care activity included getting too much rest where she was avoiding life.  Patient's participation in group was beneficial. Patient identified walking in the park as another self-care activity to add in her pursuit of recovery post-discharge. ? ? ?Therapeutic Modalities ?Brief Solution-Focused Therapy ?Motivational Interviewing ?Psychoeducation ? ? ?Read Drivers, LCSWA ?03/19/2021  1:07 PM   ? ?

## 2021-03-20 ENCOUNTER — Encounter (HOSPITAL_COMMUNITY): Payer: Self-pay

## 2021-03-20 MED ORDER — SUMATRIPTAN SUCCINATE 50 MG PO TABS
50.0000 mg | ORAL_TABLET | ORAL | Status: DC | PRN
  Administered 2021-03-20: 50 mg via ORAL
  Filled 2021-03-20: qty 1

## 2021-03-20 MED ORDER — VENLAFAXINE HCL ER 37.5 MG PO CP24
37.5000 mg | ORAL_CAPSULE | Freq: Every day | ORAL | Status: DC
  Filled 2021-03-20 (×2): qty 1

## 2021-03-20 NOTE — BH IP Treatment Plan (Signed)
Interdisciplinary Treatment and Diagnostic Plan Update  03/20/2021 Time of Session: 11:05am [name] MRN: [id number]  Principal Diagnosis: MDD (major depressive disorder), recurrent episode, severe ([place])  Secondary Diagnoses: Principal Problem:   MDD (major depressive disorder), recurrent episode, severe ([place]) Active Problems:   PTSD (post-traumatic stress disorder)   Current Medications:  Current Facility-Administered Medications  Medication Dose Route Frequency Provider Last Rate Last Admin   acetaminophen (TYLENOL) tablet 650 mg  650 mg Oral Q6H PRN Bobbitt, Shalon E, NP   650 mg at 03/20/21 0633   alum & mag hydroxide-simeth (MAALOX/MYLANTA) 200-200-20 MG/5ML suspension 30 mL  30 mL Oral Q4H PRN Bobbitt, Shalon E, NP       amLODipine (NORVASC) tablet 10 mg  10 mg Oral Daily Onuoha, Josephine C, NP   10 mg at 03/20/21 0806   atorvastatin (LIPITOR) tablet 20 mg  20 mg Oral Daily Derrill Center, NP   20 mg at 03/20/21 0806   busPIRone (BUSPAR) tablet 10 mg  10 mg Oral BID Derrill Center, NP   10 mg at 03/20/21 0806   DULoxetine (CYMBALTA) DR capsule 60 mg  60 mg Oral Daily Viann Fish E, MD   60 mg at 03/20/21 8250   hydrOXYzine (ATARAX) tablet 25 mg  25 mg Oral TID PRN Bobbitt, Shalon E, NP   25 mg at 03/20/21 0810   loperamide (IMODIUM) capsule 2 mg  2 mg Oral Q4H PRN Bobbitt, Shalon E, NP   2 mg at 03/19/21 2149   magnesium hydroxide (MILK OF MAGNESIA) suspension 30 mL  30 mL Oral Daily PRN Bobbitt, Shalon E, NP       naltrexone (DEPADE) tablet 50 mg  50 mg Oral Daily Nelda Marseille, Amy E, MD   50 mg at 03/20/21 0806   ondansetron (ZOFRAN) tablet 4 mg  4 mg Oral Q8H PRN Harlow Asa, MD   4 mg at 03/20/21 0806   pantoprazole (PROTONIX) EC tablet 40 mg  40 mg Oral Daily Nelda Marseille, Amy E, MD   40 mg at 03/20/21 0806   prazosin (MINIPRESS) capsule 1 mg  1 mg Oral QHS Derrill Center, NP   1 mg at 03/19/21 2144   pregabalin (LYRICA) capsule 75 mg  75 mg Oral BID  Harlow Asa, MD   75 mg at 03/20/21 0806   traZODone (DESYREL) tablet 25 mg  25 mg Oral QHS PRN Derrill Center, NP   25 mg at 03/19/21 2143   venlafaxine XR (EFFEXOR-XR) 24 hr capsule 75 mg  75 mg Oral Q breakfast Nelda Marseille, Amy E, MD   75 mg at 03/20/21 0806   PTA Medications: Medications Prior to Admission  Medication Sig Dispense Refill Last Dose   atorvastatin (LIPITOR) 20 MG tablet Take 20 mg by mouth daily.   Past Week   busPIRone (BUSPAR) 5 MG tablet Take 5 mg by mouth 2 (two) times daily.   Past Week   naltrexone (DEPADE) 50 MG tablet Take 1 tablet by mouth at bedtime.   Past Week   prazosin (MINIPRESS) 2 MG capsule Take 2 mg by mouth at bedtime.   Past Week   pregabalin (LYRICA) 75 MG capsule Take 75 mg by mouth 2 (two) times daily.   Past Week   traZODone (DESYREL) 50 MG tablet Take 50 mg by mouth at bedtime as needed.   Past Week   Ubrogepant (UBRELVY) 100 MG TABS Take 100 mg by mouth daily as needed. Take one tablet at  onset of headache, may repeat 1 tablet in 2 hours, no more than 2 tablets in 24 hours. 10 tablet 11 Past Week   venlafaxine XR (EFFEXOR-XR) 75 MG 24 hr capsule Take 225 mg by mouth daily.   Past Week   famotidine (PEPCID) 20 MG tablet Take 20 mg by mouth 2 (two) times daily.      Fremanezumab-vfrm (AJOVY) 225 MG/1.5ML SOSY Inject 225 mg into the skin every 30 (thirty) days. (Patient not taking: Reported on 03/18/2021) 4.5 mL 3 Not Taking    Patient Stressors: Marital or family conflict   Occupational concerns   Traumatic event    Patient Strengths: Average or above average intelligence  Communication skills  Supportive family/friends   Treatment Modalities: Medication Management, Group therapy, Case management,  1 to 1 session with clinician, Psychoeducation, Recreational therapy.   Physician Treatment Plan for Primary Diagnosis: MDD (major depressive disorder), recurrent episode, severe ([place]) Long Term Goal(s): Improvement in symptoms so as ready for  discharge   Short Term Goals: Ability to demonstrate self-control will improve Ability to identify and develop effective coping behaviors will improve Ability to maintain clinical measurements within normal limits will improve Compliance with prescribed medications will improve Ability to identify changes in lifestyle to reduce recurrence of condition will improve Ability to verbalize feelings will improve Ability to disclose and discuss suicidal ideas  Medication Management: Evaluate patient's response, side effects, and tolerance of medication regimen.  Therapeutic Interventions: 1 to 1 sessions, Unit Group sessions and Medication administration.  Evaluation of Outcomes: Progressing  Physician Treatment Plan for Secondary Diagnosis: Principal Problem:   MDD (major depressive disorder), recurrent episode, severe ([place]) Active Problems:   PTSD (post-traumatic stress disorder)  Long Term Goal(s): Improvement in symptoms so as ready for discharge   Short Term Goals: Ability to demonstrate self-control will improve Ability to identify and develop effective coping behaviors will improve Ability to maintain clinical measurements within normal limits will improve Compliance with prescribed medications will improve Ability to identify changes in lifestyle to reduce recurrence of condition will improve Ability to verbalize feelings will improve Ability to disclose and discuss suicidal ideas     Medication Management: Evaluate patient's response, side effects, and tolerance of medication regimen.  Therapeutic Interventions: 1 to 1 sessions, Unit Group sessions and Medication administration.  Evaluation of Outcomes: Progressing   RN Treatment Plan for Primary Diagnosis: MDD (major depressive disorder), recurrent episode, severe ([place]) Long Term Goal(s): Knowledge of disease and therapeutic regimen to maintain health will improve  Short Term Goals: Ability to remain free from injury will  improve, Ability to verbalize frustration and anger appropriately will improve, Ability to demonstrate self-control, Ability to identify and develop effective coping behaviors will improve, and Compliance with prescribed medications will improve  Medication Management: RN will administer medications as ordered by provider, will assess and evaluate patient's response and provide education to patient for prescribed medication. RN will report any adverse and/or side effects to prescribing provider.  Therapeutic Interventions: 1 on 1 counseling sessions, Psychoeducation, Medication administration, Evaluate responses to treatment, Monitor vital signs and CBGs as ordered, Perform/monitor CIWA, COWS, AIMS and Fall Risk screenings as ordered, Perform wound care treatments as ordered.  Evaluation of Outcomes: Progressing   LCSW Treatment Plan for Primary Diagnosis: MDD (major depressive disorder), recurrent episode, severe ([place]) Long Term Goal(s): Safe transition to appropriate next level of care at discharge, Engage patient in therapeutic group addressing interpersonal concerns.  Short Term Goals: Engage patient in aftercare planning with  referrals and resources, Increase social support, Increase ability to appropriately verbalize feelings, Identify triggers associated with mental health/substance abuse issues, and Increase skills for wellness and recovery  Therapeutic Interventions: Assess for all discharge needs, 1 to 1 time with Social worker, Explore available resources and support systems, Assess for adequacy in community support network, Educate family and significant other(s) on suicide prevention, Complete Psychosocial Assessment, Interpersonal group therapy.  Evaluation of Outcomes: Progressing   Progress in Treatment: Attending groups: Yes. and No. Participating in groups: Yes. and No. Taking medication as prescribed: Yes. Toleration medication: Yes. Family/Significant other contact made:  No, will contact:  husband Patient understands diagnosis: Yes. Discussing patient identified problems/goals with staff: Yes. Medical problems stabilized or resolved: Yes. Denies suicidal/homicidal ideation: Yes. Issues/concerns per patient self-inventory: No.   New problem(s) identified: No, Describe:  none  New Short Term/Long Term Goal(s): medication stabilization, elimination of SI thoughts, development of comprehensive mental wellness plan.    Patient Goals:  "To learn about medicine"  Discharge Plan or Barriers: Patient recently admitted. CSW will continue to follow and assess for appropriate referrals and possible discharge planning.    Reason for Continuation of Hospitalization: Anxiety Depression Medication stabilization  Estimated Length of Stay: 1-3 days   Scribe for Treatment Team: Vassie Moselle, LCSW 03/20/2021 11:32 AM

## 2021-03-20 NOTE — Progress Notes (Signed)
Chaplain received a spiritual care consult to provide [place] support.  Chaplain engaged [name] in a conversation following group and she stated that she is doing okay at this time and declined talking further.  If that changes, please put in another consult or page at [phone] ? ?[place], [facility] ?Pager, [phone] ?2:00 PM ? ?

## 2021-03-20 NOTE — Group Note (Signed)
Recreation Therapy Group Note ? ? ?Group Topic:Stress Management  ?Group Date: 03/20/2021 ?Start Time: 57 ?End Time: 2122 ?Facilitators: Victorino Sparrow, LRT,CTRS ?Location: [place] ? ? ?Goal Area(s) Addresses:  ?Patient will identify positive stress management techniques. ?Patient will identify benefits of using stress management post d/c. ? ?Group Description:  Meditation.  LRT played a meditation that focused on using your day to restore, forgive and show grace to yourself and others.  The meditation also focused on loving every part of yourself flaws and all.  Patients were to listen and follow along with the meditation as it played to fully engage in activity. ? ? ?Affect/Mood: Appropriate ?  ?Participation Level: Active ?  ?Participation Quality: Independent ?  ?Behavior: Attentive  ?  ?Speech/Thought Process: Focused ?  ?Insight: Good ?  ?Judgement: Good ?  ?Modes of Intervention: Meditation ?  ?Patient Response to Interventions:  Attentive and Engaged ?  ?Education Outcome: ? Acknowledges education and In group clarification offered   ? ?Clinical Observations/Individualized Feedback: Pt attended and participated in group session.  ?  ? ? ?Plan: Continue to engage patient in RT group sessions 2-3x/week. ? ? ?Victorino Sparrow, LRT,CTRS ?03/20/2021 1:24 PM ?

## 2021-03-20 NOTE — Group Note (Signed)
LCSW Group Therapy Note ? ? ?Group Date: 03/20/2021 ?Start Time: 1300 ?End Time: 1400 ? ?Type of Therapy and Topic: Group Therapy: Control ? ?Participation Level: Active ? ?Description of Group: ?In this group patients will discuss what is out of their control, what is somewhat in their control, and what is within their control.  They will be encouraged to explore what issues they can control and what issues are out of their control within their daily lives. They will be guided to discuss their thoughts, feelings, and behaviors related to these issues. The group will process together ways to better control things that are well within our own control and how to notice and accept the things that are not within our control. This group will be process-oriented, with patients participating in exploration of their own experiences as well as giving and receiving support and challenge from other group members.  During this group 2 worksheets will be provided to each patient to follow along and fill out.  ? ?Therapeutic Goals: ?1. Patient will identify what is within their control and what is not within their control. ?2. Patient will identify their thoughts and feelings about having control over their own lives. ?3. Patient will identify their thoughts and feelings about not having control over everything in their lives.Marland Kitchen ?4. Patient will identify ways that they can have more control over their own lives. ?5. Patient will identify areas were they can allow others to help them or provide assistance. ? ?Summary of Patient Progress:  The Pt attended the group and remained there the entire time.  The Pt accepted the worksheets that were provided and followed along throughout the group.  The Pt participated in the group discussion and was appropriate with their peers.  The Pt shared openly and demonstrated understanding of the topics being discussed.  ? ?Darleen Crocker, LCSWA ?03/20/2021  3:12 PM   ? ?

## 2021-03-20 NOTE — Progress Notes (Signed)
D. Pt presented a little sad today, stated, "Im missing my kids." Pt continues to complain of nausea-  had some relief with Zofran. Pt observed in the milieu interacting appropriately with peers and attending groups.Pt currently denies SI/HI and AVH A. Labs and vitals monitored. Pt given and educated on medications. Pt supported emotionally and encouraged to express concerns and ask questions.   ?R. Pt remains safe with 15 minute checks. Will continue POC. ? ?  ?
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
## 2021-03-20 NOTE — Progress Notes (Signed)
D: Pt informed the writer that she had an "upset stomach" and had bouts of "diarrhea" today due to ice cream. When asked about her day stated it was alright, but that she misses her kids". Informed the writer that she has an adoptive autistic son, that requires special attention. Writer observed pt's interaction with staff and peers as appropriate.  ? ?A: Support and encouragement was offered. Pt has no questions or concerns.  ?Continue to monitor every 15 mins for safety. ? ?R: Pt remains safe. ?

## 2021-03-20 NOTE — [facility] Group Notes (Signed)
Adult Psychoeducational Group Note ? ?Date:  03/20/2021 ?Time:  9:30 AM ? ?Group Topic/Focus:  ?Goals Group:   The focus of this group is to help patients establish daily goals to achieve during treatment and discuss how the patient can incorporate goal setting into their daily lives to aide in recovery. ? ?Participation Level:  Active ? ?Participation Quality:  Appropriate ? ?Affect:  Appropriate ? ?Cognitive:  Appropriate ? ?Insight: Appropriate ? ?Engagement in Group:  Engaged ? ?Modes of Intervention:  Discussion ? ?Additional Comments:  Pt stated that her goal is to work on medication management and learn more about her medication. ? ?Dub Mikes ?03/20/2021, 9:30 AM ?

## 2021-03-20 NOTE — [facility] Group Notes (Signed)
1400 PsychoEd Group- ?Pt did not attend ?

## 2021-03-21 DIAGNOSIS — F4310 Post-traumatic stress disorder, unspecified: Secondary | ICD-10-CM

## 2021-03-21 MED ORDER — DULOXETINE HCL 60 MG PO CPEP: 60.0000 mg | ORAL_CAPSULE | Freq: Every day | ORAL | 0 refills | Status: AC

## 2021-03-21 MED ORDER — VENLAFAXINE HCL ER 37.5 MG PO CP24: 37.5000 mg | ORAL_CAPSULE | Freq: Every day | ORAL | 0 refills | Status: DC

## 2021-03-21 MED ORDER — HYDROXYZINE HCL 25 MG PO TABS
25.0000 mg | ORAL_TABLET | Freq: Three times a day (TID) | ORAL | 0 refills | Status: DC | PRN
Start: 2021-03-21 — End: 2023-07-12

## 2021-03-21 MED ORDER — BUSPIRONE HCL 10 MG PO TABS: 10.0000 mg | ORAL_TABLET | Freq: Two times a day (BID) | ORAL | 0 refills | Status: DC

## 2021-03-21 MED ORDER — AMLODIPINE BESYLATE 10 MG PO TABS
10.0000 mg | ORAL_TABLET | Freq: Every day | ORAL | 0 refills | Status: DC
Start: 2021-03-22 — End: 2023-07-12

## 2021-03-21 MED ORDER — ONDANSETRON HCL 4 MG PO TABS
4.0000 mg | ORAL_TABLET | Freq: Three times a day (TID) | ORAL | 0 refills | Status: DC | PRN
Start: 2021-03-21 — End: 2023-07-12

## 2021-03-21 NOTE — [facility] Group Notes (Signed)
?  Spiritual care group on grief and loss facilitated by chaplain Janne Napoleon, [facility]  ? ?Group Goal:  ? ?Support / Education around grief and loss  ? ?Members engage in facilitated group support and psycho-social education.  ? ?Group Description:  ? ?Following introductions and group rules, group members engaged in facilitated group dialog and support around topic of loss, with particular support around experiences of loss in their lives. Group Identified types of loss (relationships / self / things) and identified patterns, circumstances, and changes that precipitate losses. Reflected on thoughts / feelings around loss, normalized grief responses, and recognized variety in grief experience. Group noted Worden's four tasks of grief in discussion.  ? ?Group drew on Adlerian / Rogerian, narrative, MI,  ? ?Patient Progress: Shalah attended group and was actively engaged in the conversation.  Her comments showed insight and she was respectful of peers. ? ?[employer], Bcc ?Pager, [phone] ?11:05 AM ? ?
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
## 2021-03-21 NOTE — Group Note (Signed)
Date:  03/21/2021 ?Time:  11:31 AM ? ?Group Topic/Focus:  ?Goals Group:   The focus of this group is to help patients establish daily goals to achieve during treatment and discuss how the patient can incorporate goal setting into their daily lives to aide in recovery. ?Orientation:   The focus of this group is to educate the patient on the purpose and policies of crisis stabilization and provide a format to answer questions about their admission.  The group details unit policies and expectations of patients while admitted. ? ? ? ?Participation Level:  Active ? ?Participation Quality:  Appropriate ? ?Affect:  Appropriate ? ?Cognitive:  Appropriate ? ?Insight: Appropriate ? ?Engagement in Group:  Engaged ? ?Modes of Intervention:  Discussion ? ?Additional Comments:   ? ?Garvin Fila ?03/21/2021, 11:31 AM ? ?

## 2021-03-21 NOTE — Progress Notes (Signed)
?  [facility] Adult Case Management Discharge Plan : ? ?Will you be returning to the same living situation after discharge:  Yes,  Home  ?At discharge, do you have transportation home?: Yes,  Husband  ?Do you have the ability to pay for your medications: Yes,  Insurance  ? ?Release of information consent forms completed and in the chart;  Patient's signature needed at discharge. ? ?Patient to Follow up at: ? Follow-up Information   ? ? [facility] [place]. Call on 03/21/2021.   ?Why: You have an appointment with Renaldo Harrison on 03/21/21 at 4:30 pm for therapy services.  This will be in the [place] office. ?Contact information: ?[address] ?[place], [place] [address] ? ?P:  [phone], [phone] ? ?  ?  ? ? [facility] Follow up on 03/28/2021.   ?Why: You have an appointment for medication management services on 03/28/21 at 1:15 pm.  This will be a Virtual appointment.  At this time, you may schedule an appointment for therapy services if you wish.  * Please bring your discharge paperwork from this hospitalization with you to your appointment. ?Contact information: ?[address] ?[address] ?[place] [address] ?[phone] ? ? ?  ?  ? ? [place] Follow up.   ?Specialty: Professional Counselor ?Why: You may also use this provider for therapy services until your scheduled appointment.  You may use walk-in hours from 9:00 to 1:00 pm,  Monday through Thursday. ?Contact information: ?[address] ?[place] [address] ?[phone] ? ? ?  ?  ? ?  ?  ? ?  ? ? ?Next level of care provider has access to [facility] ? ?Safety Planning and Suicide Prevention discussed: Yes,  with patient and husband  ? ?  ? ?Has patient been referred to the Quitline?: N/A patient is not a smoker ? ?Patient has been referred for addiction treatment: N/A ? ?Darleen Crocker, LCSWA ?03/21/2021, 12:10 PM ?

## 2021-03-21 NOTE — [facility] Group Notes (Signed)
Discharge order received for this patient. AVS discharge instructions reviewed at length and avs updated. [employer] given. Pt denies any SI/HI or A/V Hallucinations. Pt pleasant and cooperative and states she feels ready for discharge. No signs of acute decompensation. Pt verbalized understanding of all follow up care.  ?
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
## 2021-03-21 NOTE — Progress Notes (Signed)
Patient  attend AA wrap up group ?

## 2021-03-21 NOTE — Progress Notes (Signed)
?   03/20/21 2100  ?Psych Admission Type (Psych Patients Only)  ?Admission Status Voluntary  ?Psychosocial Assessment  ?Patient Complaints None  ?Eye Contact Fair  ?Facial Expression Worried  ?Affect Appropriate to circumstance  ?Speech Logical/coherent  ?Interaction Assertive  ?Motor Activity Slow  ?Appearance/Hygiene Improved  ?Behavior Characteristics Appropriate to situation  ?Mood Pleasant  ?Thought Process  ?Coherency WDL  ?Content WDL  ?Delusions None reported or observed  ?Perception WDL  ?Hallucination None reported or observed  ?Judgment WDL  ?Confusion None  ?Danger to Self  ?Current suicidal ideation? Denies  ?Self-Injurious Behavior No self-injurious ideation or behavior indicators observed or expressed   ?Agreement Not to Harm Self Yes  ?Description of Agreement verbal  ?Danger to Others  ?Danger to Others None reported or observed  ? ?Pt is alert and oriented x3, denies SI/HI and verbally contracted for safety. Pt has been visible in the milieu, attended the wrap up group, took all her night medications as ordered and currently in bed a sleep with even and unlabored respiration, will continue to monitor. ?

## 2021-03-29 ENCOUNTER — Telehealth (HOSPITAL_COMMUNITY): Payer: Self-pay | Admitting: Family Medicine

## 2021-03-29 NOTE — BH Assessment (Signed)
Care Management - Follow Up [place] Discharges  ? ?Patient has been placed in an inpatient psychiatric hospital ([facility]) on 03-17-21 ?

## 2021-04-27 ENCOUNTER — Encounter (HOSPITAL_BASED_OUTPATIENT_CLINIC_OR_DEPARTMENT_OTHER): Payer: Self-pay

## 2021-04-27 ENCOUNTER — Other Ambulatory Visit: Payer: Self-pay

## 2021-04-27 DIAGNOSIS — R32 Unspecified urinary incontinence: Secondary | ICD-10-CM | POA: Diagnosis not present

## 2021-04-27 DIAGNOSIS — R059 Cough, unspecified: Secondary | ICD-10-CM | POA: Diagnosis not present

## 2021-04-27 DIAGNOSIS — Z20822 Contact with and (suspected) exposure to covid-19: Secondary | ICD-10-CM | POA: Insufficient documentation

## 2021-04-27 DIAGNOSIS — R531 Weakness: Secondary | ICD-10-CM | POA: Insufficient documentation

## 2021-04-27 DIAGNOSIS — R109 Unspecified abdominal pain: Secondary | ICD-10-CM | POA: Insufficient documentation

## 2021-04-27 DIAGNOSIS — R351 Nocturia: Secondary | ICD-10-CM | POA: Diagnosis not present

## 2021-04-27 DIAGNOSIS — M545 Low back pain, unspecified: Secondary | ICD-10-CM | POA: Diagnosis not present

## 2021-04-27 LAB — RESP PANEL BY RT-PCR (FLU A&B, COVID) ARPGX2
Influenza A by PCR: NEGATIVE
Influenza B by PCR: NEGATIVE
SARS Coronavirus 2 by RT PCR: NEGATIVE

## 2021-04-27 NOTE — ED Triage Notes (Signed)
Pt reports abdomen cramps and urinating on herself during the night. She is not woken up by it. Going on since the April 18, 2021. ? ?Pt sore throat and ear ache, and cough.  ?

## 2021-04-28 ENCOUNTER — Emergency Department (HOSPITAL_BASED_OUTPATIENT_CLINIC_OR_DEPARTMENT_OTHER)
Admission: EM | Admit: 2021-04-28 | Discharge: 2021-04-28 | Disposition: A | Discharge status: Home / Self Care | Payer: BC Managed Care – PPO | Attending: Emergency Medicine | Admitting: Emergency Medicine

## 2021-04-28 ENCOUNTER — Emergency Department (HOSPITAL_BASED_OUTPATIENT_CLINIC_OR_DEPARTMENT_OTHER): Payer: BC Managed Care – PPO

## 2021-04-28 DIAGNOSIS — R200 Anesthesia of skin: Secondary | ICD-10-CM

## 2021-04-28 DIAGNOSIS — M5126 Other intervertebral disc displacement, lumbar region: Secondary | ICD-10-CM

## 2021-04-28 DIAGNOSIS — M545 Low back pain, unspecified: Secondary | ICD-10-CM

## 2021-04-28 LAB — URINALYSIS, ROUTINE W REFLEX MICROSCOPIC
Bilirubin Urine: NEGATIVE
Glucose, UA: NEGATIVE mg/dL
Hgb urine dipstick: NEGATIVE
Ketones, ur: NEGATIVE mg/dL
Leukocytes,Ua: NEGATIVE
Nitrite: NEGATIVE
Protein, ur: 30 mg/dL — AB
Specific Gravity, Urine: 1.031 — ABNORMAL HIGH (ref 1.005–1.030)
pH: 6.5 (ref 5.0–8.0)

## 2021-04-28 LAB — COMPREHENSIVE METABOLIC PANEL
ALT: 21 U/L (ref 0–44)
AST: 20 U/L (ref 15–41)
Albumin: 4.5 g/dL (ref 3.5–5.0)
Alkaline Phosphatase: 84 U/L (ref 38–126)
Anion gap: 9 (ref 5–15)
BUN: 13 mg/dL (ref 6–20)
CO2: 24 mmol/L (ref 22–32)
Calcium: 9.5 mg/dL (ref 8.9–10.3)
Chloride: 105 mmol/L (ref 98–111)
Creatinine, Ser: 0.63 mg/dL (ref 0.44–1.00)
GFR, Estimated: >60 mL/min (ref >60)
Glucose, Bld: 96 mg/dL (ref 70–99)
Potassium: 3.9 mmol/L (ref 3.5–5.1)
Sodium: 138 mmol/L (ref 135–145)
Total Bilirubin: 0.5 mg/dL (ref 0.3–1.2)
Total Protein: 7.6 g/dL (ref 6.5–8.1)

## 2021-04-28 LAB — CBC
HCT: 42.1 % (ref 36.0–46.0)
Hemoglobin: 13.8 g/dL (ref 12.0–15.0)
MCH: 27.5 pg (ref 26.0–34.0)
MCHC: 32.8 g/dL (ref 30.0–36.0)
MCV: 84 fL (ref 80.0–100.0)
Platelets: 265 10*3/uL (ref 150–400)
RBC: 5.01 MIL/uL (ref 3.87–5.11)
RDW: 14.1 % (ref 11.5–15.5)
WBC: 9.5 10*3/uL (ref 4.0–10.5)
nRBC: 0 % (ref 0.0–0.2)

## 2021-04-28 LAB — LIPASE, BLOOD: Lipase: 19 U/L (ref 11–51)

## 2021-04-28 MED ORDER — GADOBUTROL 1 MMOL/ML IV SOLN
10.0000 mL | Freq: Once | INTRAVENOUS | Status: AC | PRN
  Administered 2021-04-28: 10 mL via INTRAVENOUS

## 2021-04-28 MED ORDER — LORAZEPAM 2 MG/ML IJ SOLN
1.0000 mg | Freq: Once | INTRAMUSCULAR | Status: AC
  Administered 2021-04-28: 1 mg via INTRAVENOUS
  Filled 2021-04-28: qty 1

## 2021-04-28 NOTE — ED Provider Notes (Signed)
Follow-up on MRI ?Physical Exam  ?BP (!) 175/91 (BP Location: Left Wrist)   Pulse 84   Temp 98.6 ?F (37 ?C) (Oral)   Resp 16   Ht 5' 3"  (1.6 m)   Wt 131.5 kg   LMP 06/29/2017 (Exact Date)   SpO2 99%   BMI 51.37 kg/m?  ? ?Physical Exam ? ?Procedures  ?Procedures ? ?ED Course / MDM  ?  ?Medical Decision Making ?Amount and/or Complexity of Data Reviewed ?Labs: ordered. ?Radiology: ordered. ? ?Risk ?Prescription drug management. ? ? ?Patient has had sporadic nighttime urinary incontinence for a month.  There have been a total of 3 episodes.  She reports the last time, she soaked the bed and had not realized it.  She reports she is experiencing some urgency during the daytime but no loss of continence.  Patient reports when her family went to [facility] last week, she was having a lot of low back pain and then developed loss of sensation and numbness from her waist down.  Symptoms were more pronounced on the right side.  She reports she sat out activities for the remainder of the day and had to have her husband help her to walk around and used handrails to be able to go up stairs.  She reports after that she rested for about 18 hours and the symptoms ultimately resolved.  She reports she had some pins and needle sensation in the right leg but she went back to her baseline. ? ?Patient reports she had an evaluation for MS about 10 to 15 years ago.  It was negative at that time.  She does describe history of complex migraines. ? ?Patient is alert with clear mental status.  Speech clear.  Follows all commands without difficulty.  Patient can get up off of the stretcher and bear weight independently on each leg to take off her garments.  In supine position she can elevate each leg off the bed and hold however slightly weaker on the right for holding against resistance.  I did extensive sharp dull testing from the feet to the perineum.  She was mostly accurate throughout.  Occasionally missed a touch, but was not  consistent in that area for loss of sensation.  Rectal tone is normal.  Perianal sensation intact. ? ?Patient describes prior diagnosis of CVA based on MRI, not specific symptoms or event. ? ?MRI lumbar spine does show disc herniation which could account for the patient's pain but seems less likely to account for bilateral loss of sensation that resolved with 18 hours of rest.  Will add MRI brain. ? ?Consult: Neurology Dr. Su Monks.  We reviewed MRI of the brain.  Dr. Quinn Axe does not feel that any further work-up for MS is needed based on findings.  She does suggest based on the case history patient would also get cervical and thoracic spine MRI.  These need to be done with and without contrast.  Since the patient already had a contrasted study and she has no deficits currently, she advises it will be fine to have the studies done in the next week or so. ? ?I have carefully reviewed the plan with the patient.  She is aware of the need to contact PCP to schedule with and without MRIs of the cervical and thoracic spine.  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  Careful return precautions are included in discharge instructions. ? ? ? ?  ?Charlesetta Shanks, MD ?04/28/21  1037 ? ?

## 2021-04-28 NOTE — ED Notes (Signed)
Discussed pending MRI with pt. Shift supervisor to arrive at 3 am will be able to give update as to when MRI could be completed at this facility. Per calendar available times are 7 am for MRI. Original plan was for pt to transfer to [facility] Emergency Department, accepting EDP [place]. Hold awaiting update on MRI here ?

## 2021-04-28 NOTE — Discharge Instructions (Signed)
1.  Call your doctor to schedule a cervical and thoracic MRI with and without contrast.  You had MRI of your brain and low back done in the emergency department.  However, consultation with the neurologist recommended completing MRI series ideally within the next week or so. ?2.  Return to the emergency department immediately if you get weakness numbness loss of sensation or function of your legs. ?3.  Due to significant disc herniations, you are advised to follow-up with the neurosurgical group as well.  It may be best to do this after your MRIs are completed so they can evaluate the complete studies. ?

## 2021-04-28 NOTE — ED Notes (Signed)
Patient transported to MRI 

## 2021-04-28 NOTE — ED Provider Notes (Signed)
?[facility]PT ?Provider Note ? ? ?CSN: [id number] ?Arrival date & time: 04/27/21  2137 ? ?  ? ?History ? ?Chief Complaint  ?Patient presents with  ? Abdominal Cramping  ? Cough  ? Otalgia  ? Nocturia  ? ? ?[name] Rotter is a 46 y.o. female. ? ?Patient is a 46 year old female presenting with complaints of urinary incontinence, low back pain, and suprapubic cramping.  She has had several episodes of urinary incontinence during the night while sleeping.  She also reports having pain in her low back and weakness in her legs.  She [place], then developed back pain and weakness where she had to "pull herself up the stairs with her arms".  She denies any prior back surgery.  She was seen by her primary doctor, then requested for her to come to the ER for an emergent MRI due to these urinary issues.  Patient also describes earache and cough ? ?The history is provided by the patient.  ? ?  ? ?Home Medications ?Prior to Admission medications   ?Medication Sig Start Date End Date Taking? Authorizing Provider  ?amLODipine (NORVASC) 10 MG tablet Take 1 tablet (10 mg total) by mouth daily. 03/22/21   Maida Sale, MD  ?atorvastatin (LIPITOR) 20 MG tablet Take 20 mg by mouth daily. 03/10/21   [provider]  ?busPIRone (BUSPAR) 10 MG tablet Take 1 tablet (10 mg total) by mouth 2 (two) times daily. 03/21/21   Maida Sale, MD  ?DULoxetine (CYMBALTA) 60 MG capsule Take 1 capsule (60 mg total) by mouth daily. 03/22/21   Maida Sale, MD  ?famotidine (PEPCID) 20 MG tablet Take 20 mg by mouth 2 (two) times daily. 12/09/20   [provider]  ?Fremanezumab-vfrm (AJOVY) 225 MG/1.5ML SOSY Inject 225 mg into the skin every 30 (thirty) days. ?Patient not taking: Reported on 03/18/2021 03/17/19   Debbora Presto, NP  ?hydrOXYzine (ATARAX) 25 MG tablet Take 1 tablet (25 mg total) by mouth 3 (three) times daily as needed for  anxiety. 03/21/21   Maida Sale, MD  ?naltrexone (DEPADE) 50 MG tablet Take 1 tablet by mouth at bedtime. 02/28/21   [provider]  ?ondansetron (ZOFRAN) 4 MG tablet Take 1 tablet (4 mg total) by mouth every 8 (eight) hours as needed for nausea or vomiting. 03/21/21   Maida Sale, MD  ?prazosin (MINIPRESS) 2 MG capsule Take 2 mg by mouth at bedtime. 03/09/21   [provider]  ?pregabalin (LYRICA) 75 MG capsule Take 75 mg by mouth 2 (two) times daily. 02/03/21   [provider]  ?traZODone (DESYREL) 50 MG tablet Take 50 mg by mouth at bedtime as needed. 03/09/21   [provider]  ?Ubrogepant (UBRELVY) 100 MG TABS Take 100 mg by mouth daily as needed. Take one tablet at onset of headache, may repeat 1 tablet in 2 hours, no more than 2 tablets in 24 hours. 03/17/19   Lomax, Amy, NP  ?venlafaxine XR (EFFEXOR-XR) 37.5 MG 24 hr capsule Take 1 capsule (37.5 mg total) by mouth daily with breakfast. Take until gone and then stop 03/22/21   Maida Sale, MD  ?   ? ?Allergies    ?Hydrocodone   ? ?Review of Systems   ?Review of Systems  ?All other systems reviewed and are negative. ? ?Physical Exam ?Updated Vital Signs ?BP (!) 158/109   Pulse 83   Temp 98.6 ?F (37 ?C) (  Oral)   Resp 18   Ht 5' 3"  (1.6 m)   Wt 131.5 kg   LMP 06/29/2017 (Exact Date)   SpO2 100%   BMI 51.37 kg/m?  ?Physical Exam ?Vitals and nursing note reviewed.  ?Constitutional:   ?   General: She is not in acute distress. ?   Appearance: She is well-developed. She is not diaphoretic.  ?HENT:  ?   Head: Normocephalic and atraumatic.  ?Cardiovascular:  ?   Rate and Rhythm: Normal rate and regular rhythm.  ?   Heart sounds: No murmur heard. ?  No friction rub. No gallop.  ?Pulmonary:  ?   Effort: Pulmonary effort is normal. No respiratory distress.  ?   Breath sounds: Normal breath sounds. No wheezing.  ?Abdominal:  ?   General: Bowel sounds are normal. There is no distension.  ?   Palpations: Abdomen  is soft.  ?   Tenderness: There is no abdominal tenderness.  ?Musculoskeletal:     ?   General: Normal range of motion.  ?   Cervical back: Normal range of motion and neck supple.  ?   Comments: There is some tenderness in the lumbar region.  ?Skin: ?   General: Skin is warm and dry.  ?Neurological:  ?   General: No focal deficit present.  ?   Mental Status: She is alert and oriented to person, place, and time.  ?   Comments: I am unable to elicit DTRs, possibly secondary to body habitus.  Strength is 5 out of 5 in both lower extremities.  ? ? ?ED Results / Procedures / Treatments   ?Labs ?(all labs ordered are listed, but only abnormal results are displayed) ?Labs Reviewed  ?URINALYSIS, ROUTINE W REFLEX MICROSCOPIC - Abnormal; Notable for the following components:  ?    Result Value  ? Specific Gravity, Urine 1.031 (*)   ? Protein, ur 30 (*)   ? Bacteria, UA RARE (*)   ? All other components within normal limits  ?RESP PANEL BY RT-PCR (FLU A&B, COVID) ARPGX2  ?LIPASE, BLOOD  ?COMPREHENSIVE METABOLIC PANEL  ?CBC  ? ? ?EKG ?None ? ?Radiology ?No results found. ? ?Procedures ?Procedures  ? ? ?Medications Ordered in ED ?Medications - No data to display ? ?ED Course/ Medical Decision Making/ A&P ? ?Patient referred here by her primary doctor for evaluation of urinary incontinence, low back pain, and weakness in her legs.  Patient also reports what sounds like saddle anesthesia.  MRI was recommended by the primary doctor and will be obtained here in the ER. ? ?There is no tech to perform the MRI at night.  The possibility of transfer to [facility] for MRI was entertained, however due to patient volumes there and the fact that MRI begins here at 7 AM, patient will remain here and undergo MRI in the morning.  Care signed out to oncoming provider at shift change to obtain the results of the MRI and determine the final disposition. ? ?Final Clinical Impression(s) / ED Diagnoses ?Final diagnoses:  ?None  ? ? ?Rx / DC Orders ?ED  Discharge Orders   ? ? None  ? ?  ? ? ?  ?Veryl Speak, MD ?04/28/21 [phone] ? ?

## 2021-04-28 NOTE — ED Notes (Signed)
Pt informed pending MRI here after 7 am  ?

## 2021-05-10 ENCOUNTER — Encounter: Payer: Self-pay | Admitting: Hematology

## 2021-05-10 ENCOUNTER — Ambulatory Visit
Admission: RE | Admit: 2021-05-10 | Discharge: 2021-05-10 | Disposition: A | Discharge status: Home / Self Care | Payer: BC Managed Care – PPO | Source: Ambulatory Visit | Attending: Family Medicine | Admitting: Family Medicine

## 2021-05-10 DIAGNOSIS — Z1231 Encounter for screening mammogram for malignant neoplasm of breast: Secondary | ICD-10-CM

## 2021-05-11 ENCOUNTER — Other Ambulatory Visit: Payer: Self-pay | Admitting: Neurological Surgery

## 2021-05-11 ENCOUNTER — Encounter: Payer: Self-pay | Admitting: Hematology

## 2021-05-11 DIAGNOSIS — G379 Demyelinating disease of central nervous system, unspecified: Secondary | ICD-10-CM

## 2021-05-23 DIAGNOSIS — R32 Unspecified urinary incontinence: Secondary | ICD-10-CM | POA: Insufficient documentation

## 2021-05-23 DIAGNOSIS — G988 Other disorders of nervous system: Secondary | ICD-10-CM | POA: Insufficient documentation

## 2021-06-06 ENCOUNTER — Ambulatory Visit
Admission: RE | Admit: 2021-06-06 | Discharge: 2021-06-06 | Disposition: A | Discharge status: Home / Self Care | Payer: BC Managed Care – PPO | Source: Ambulatory Visit | Attending: Neurological Surgery | Admitting: Neurological Surgery

## 2021-06-06 DIAGNOSIS — G379 Demyelinating disease of central nervous system, unspecified: Secondary | ICD-10-CM

## 2021-06-06 MED ORDER — GADOBENATE DIMEGLUMINE 529 MG/ML IV SOLN
20.0000 mL | Freq: Once | INTRAVENOUS | Status: AC | PRN
  Administered 2021-06-06: 20 mL via INTRAVENOUS

## 2021-06-14 ENCOUNTER — Other Ambulatory Visit: Payer: Self-pay | Admitting: Gastroenterology

## 2021-06-14 DIAGNOSIS — R1030 Lower abdominal pain, unspecified: Secondary | ICD-10-CM

## 2021-08-23 ENCOUNTER — Encounter (INDEPENDENT_AMBULATORY_CARE_PROVIDER_SITE_OTHER): Payer: Self-pay

## 2021-12-31 IMAGING — MR MR MRA HEAD W/O CM
1 series · 24 of 48 positions shown · non-contrast
Comparison: No pertinent prior exam.
COMPARISON: No pertinent prior exam.

CLINICAL DATA: Dizziness.

EXAM:
MRI HEAD WITHOUT CONTRAST
MRA HEAD WITHOUT CONTRAST
TECHNIQUE: Multiplanar, multi-echo pulse sequences of the brain and surrounding
structures were acquired without intravenous contrast. Angiographic
images of the Circle of Willis were acquired using MRA technique
without intravenous contrast.

[Series 5: 3d cow · axial · 0.5mm · 0.41mm/px · z∈[-126,-37]mm · 24 of 188 slices shown]
[im 1/188]
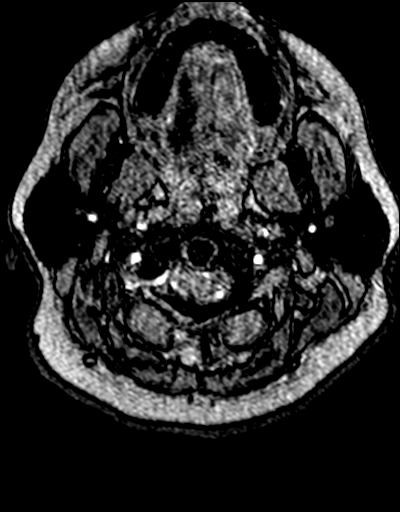
[im 4/188]
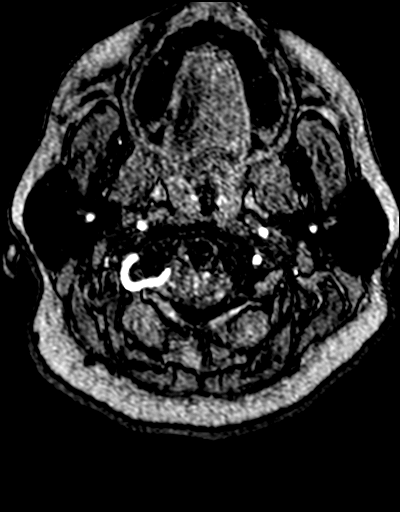
[im 8/188]
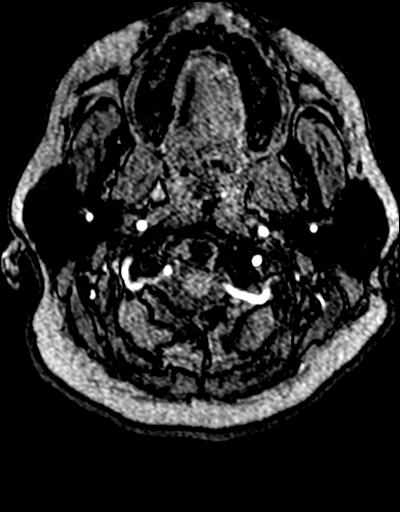
[im 12/188]
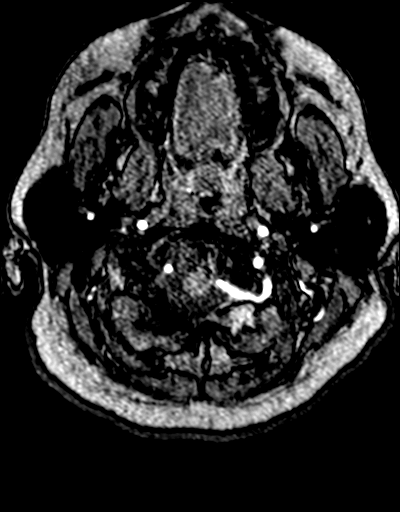
[im 16/188]
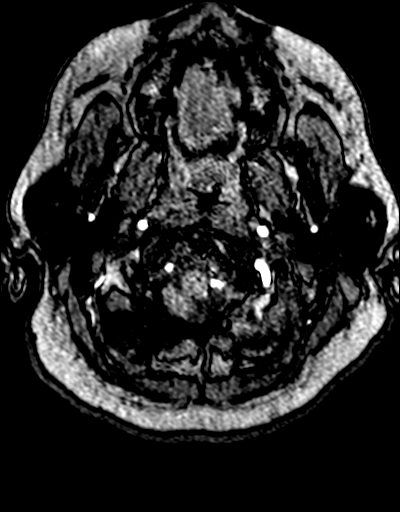
[im 20/188]
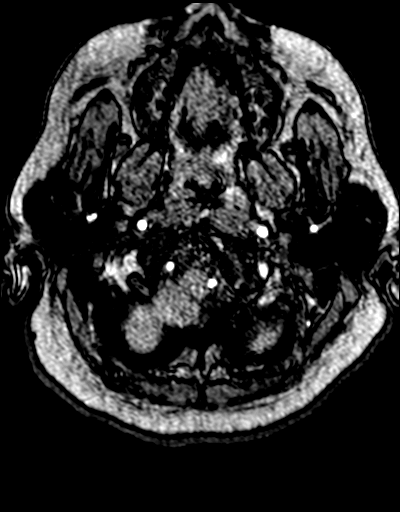
[im 24/188]
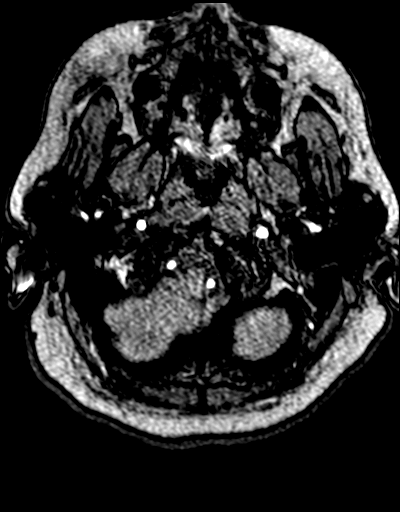
[im 28/188]
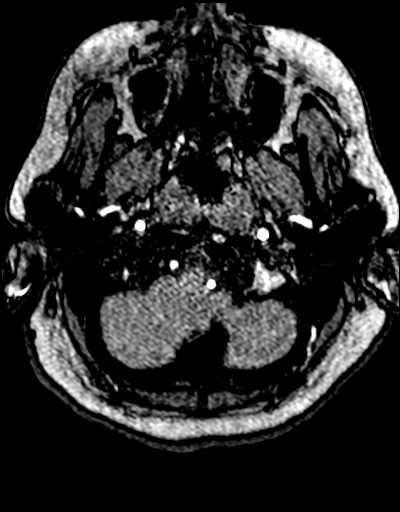
[im 32/188]
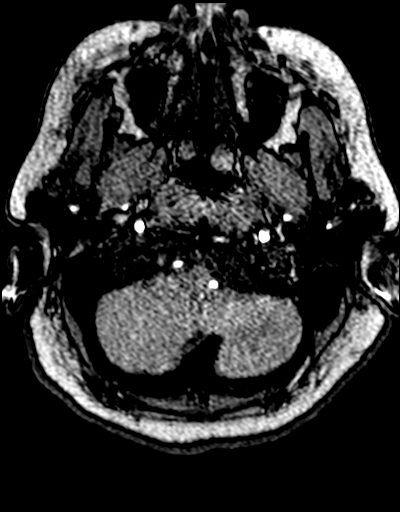
[im 36/188]
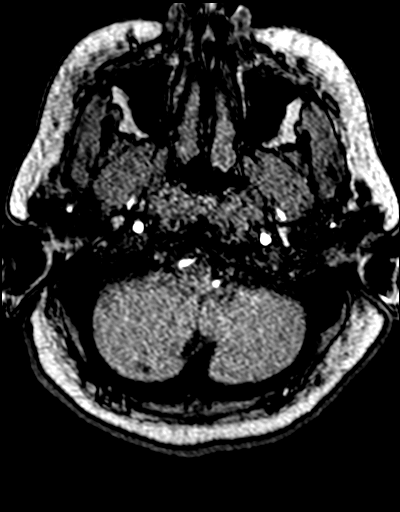
[im 40/188]
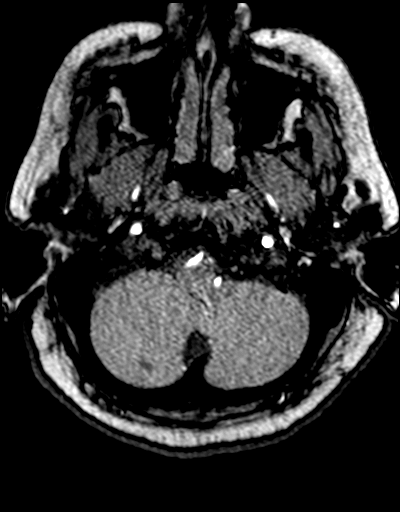
[im 44/188]
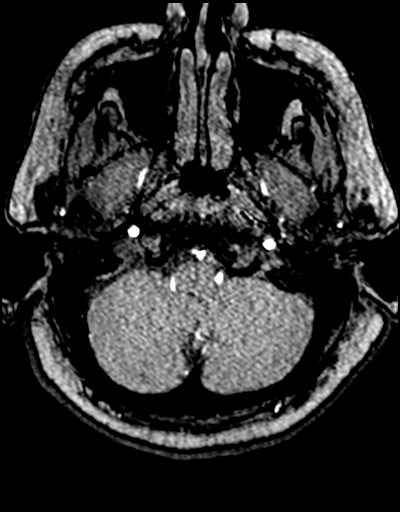
[im 48/188]
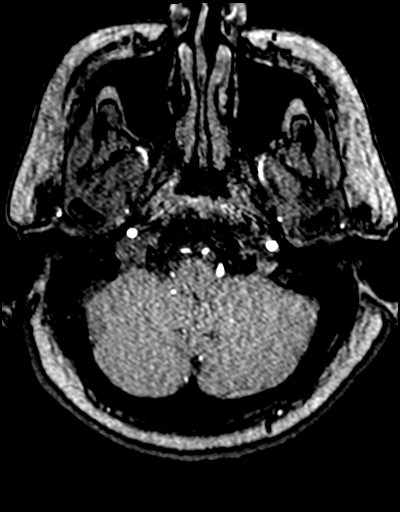
[im 52/188]
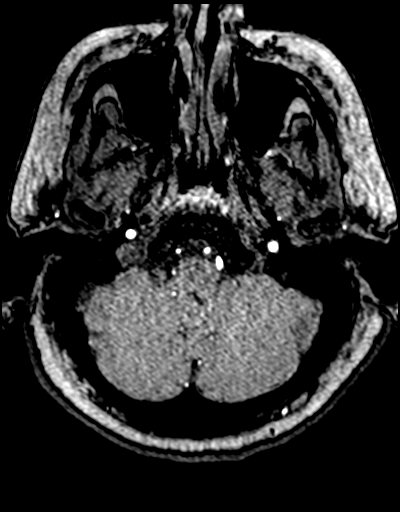
[im 56/188]
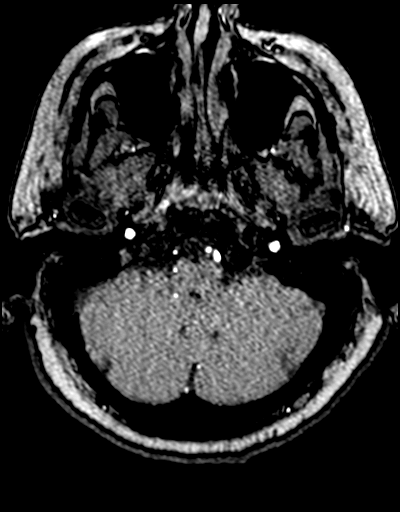
[im 60/188]
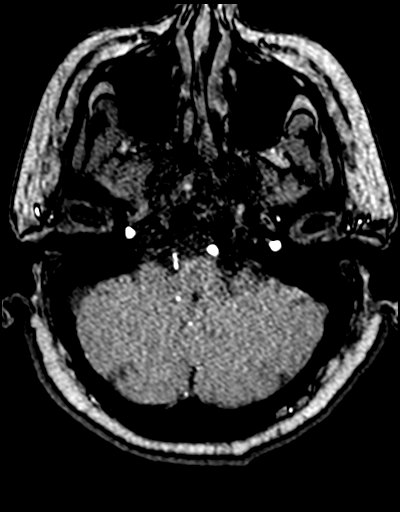
[im 64/188]
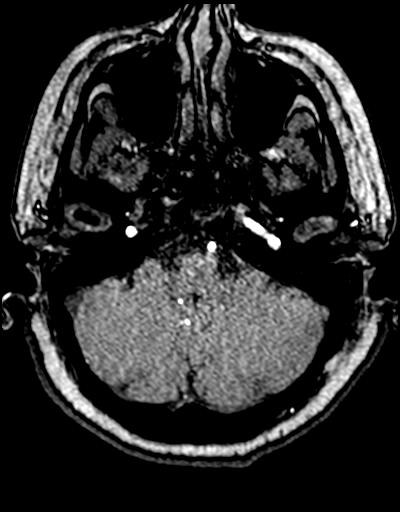
[im 84/188]
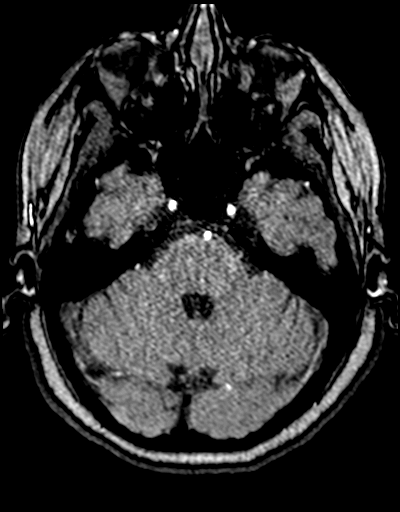
[im 96/188]
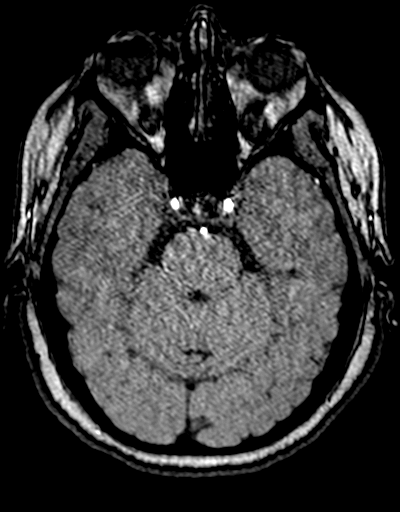
[im 108/188]
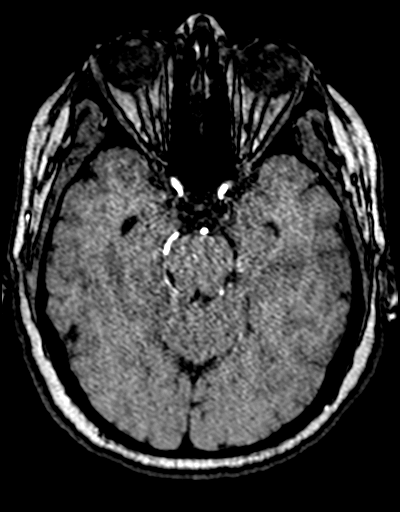
[im 132/188]
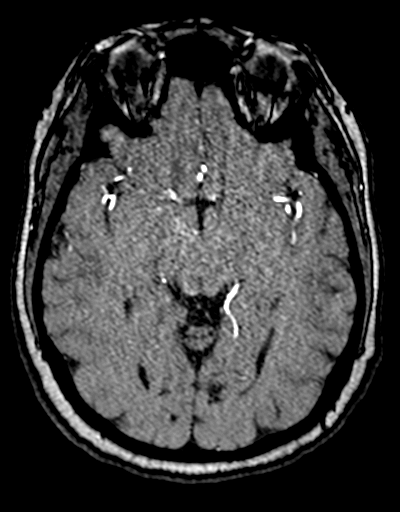
[im 156/188]
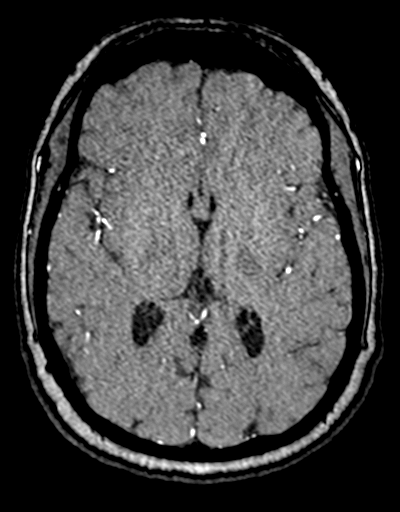
[im 160/188]
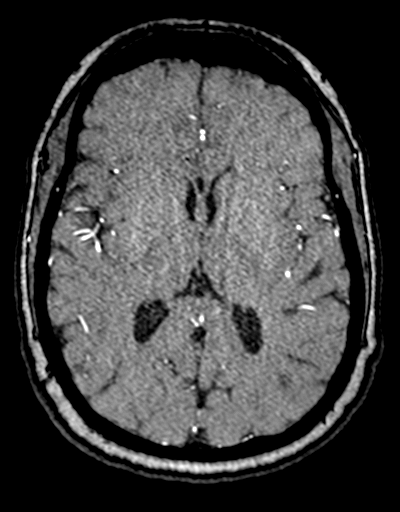
[im 180/188]
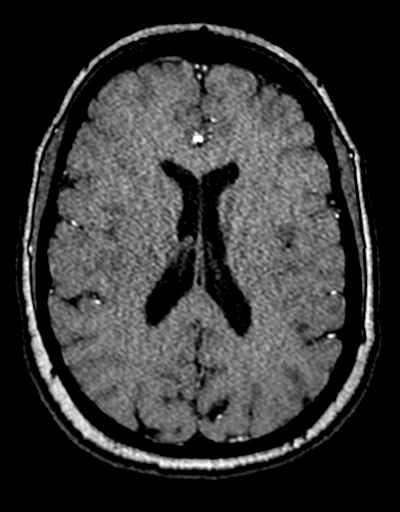

[24 of 48 positions shown; findings below may reference images not displayed]

FINDINGS: MRI HEAD FINDINGS

Brain: No acute infarction, hemorrhage, hydrocephalus, extra-axial
collection or mass lesion. Mild scattered T2/FLAIR hyperintensities
within the white matter.

Vascular: See below.

Skull and upper cervical spine: Normal marrow signal.

Sinuses/Orbits: Clear sinuses

Other: No mastoid effusions.

MRA HEAD FINDINGS

Anterior circulation: No large vessel occlusion or proximal
hemodynamically significant stenosis. No aneurysm.

Posterior circulation: No large vessel occlusion or proximal
hemodynamically significant stenosis. No aneurysm.
IMPRESSION: MRI:

1. No acute intracranial abnormality. Specifically, no acute
infarct.
2. Mild scattered T2/FLAIR hyperintensities within the white matter,
which are slightly advanced in number for age. This finding is
nonspecific but most likely related to chronic microvascular
ischemic disease. Additional differential considerations include
prior demyelination, chronic migraines, or prior
inflammation/infection.

MRA:

No large vessel occlusion or proximal hemodynamically significant
stenosis.

## 2022-06-06 DIAGNOSIS — I517 Cardiomegaly: Secondary | ICD-10-CM | POA: Insufficient documentation

## 2022-06-11 DIAGNOSIS — G510 Bell's palsy: Secondary | ICD-10-CM | POA: Insufficient documentation

## 2023-01-14 ENCOUNTER — Encounter: Payer: Self-pay | Admitting: Hematology

## 2023-01-22 ENCOUNTER — Encounter: Payer: Self-pay | Admitting: Hematology

## 2023-01-22 ENCOUNTER — Encounter (HOSPITAL_BASED_OUTPATIENT_CLINIC_OR_DEPARTMENT_OTHER): Payer: Self-pay

## 2023-01-22 ENCOUNTER — Other Ambulatory Visit: Payer: Self-pay

## 2023-01-22 ENCOUNTER — Emergency Department (HOSPITAL_BASED_OUTPATIENT_CLINIC_OR_DEPARTMENT_OTHER)
Admission: EM | Admit: 2023-01-22 | Discharge: 2023-01-22 | Disposition: A | Discharge status: Home / Self Care | Payer: 59 | Attending: Emergency Medicine | Admitting: Emergency Medicine

## 2023-01-22 DIAGNOSIS — I10 Essential (primary) hypertension: Secondary | ICD-10-CM | POA: Insufficient documentation

## 2023-01-22 DIAGNOSIS — L02214 Cutaneous abscess of groin: Secondary | ICD-10-CM | POA: Insufficient documentation

## 2023-01-22 DIAGNOSIS — L0291 Cutaneous abscess, unspecified: Secondary | ICD-10-CM

## 2023-01-22 MED ORDER — LIDOCAINE HCL (PF) 1 % IJ SOLN
10.0000 mL | Freq: Once | INTRAMUSCULAR | Status: AC
  Administered 2023-01-22: 10 mL
  Filled 2023-01-22: qty 10

## 2023-01-22 MED ORDER — IBUPROFEN 800 MG PO TABS
800.0000 mg | ORAL_TABLET | Freq: Once | ORAL | Status: AC
Start: 2023-01-22 — End: 2023-01-22
  Administered 2023-01-22: 800 mg via ORAL
  Filled 2023-01-22: qty 1

## 2023-01-22 MED ORDER — CEPHALEXIN 500 MG PO CAPS: 500.0000 mg | ORAL_CAPSULE | Freq: Four times a day (QID) | ORAL | 0 refills | Status: AC

## 2023-01-22 NOTE — Discharge Instructions (Signed)
 You were evaluated in the Emergency Department and after careful evaluation, we did not find any emergent condition requiring admission or further testing in the hospital.  Your exam/testing today is overall reassuring.  Symptoms likely due to an abscess from an ingrown hair.  We drained it here in the emergency department.  Take the Keflex  antibiotic as directed.  Please return to the Emergency Department if you experience any worsening of your condition.   Thank you for allowing us  to be a part of your care.

## 2023-01-22 NOTE — ED Triage Notes (Signed)
 Pt presents via POV c/o "lump" in right groin area since yesterday. Reports "busted" this am. Pt reports "looks like bleeding hole".   Pt ambulatory to triage. A&O x4.
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
## 2023-04-22 ENCOUNTER — Emergency Department (HOSPITAL_BASED_OUTPATIENT_CLINIC_OR_DEPARTMENT_OTHER)
Admission: EM | Admit: 2023-04-22 | Discharge: 2023-04-23 | Disposition: A | Discharge status: Home / Self Care | Attending: Emergency Medicine | Admitting: Emergency Medicine

## 2023-04-22 ENCOUNTER — Other Ambulatory Visit: Payer: Self-pay

## 2023-04-22 ENCOUNTER — Emergency Department (HOSPITAL_BASED_OUTPATIENT_CLINIC_OR_DEPARTMENT_OTHER)

## 2023-04-22 DIAGNOSIS — H539 Unspecified visual disturbance: Secondary | ICD-10-CM

## 2023-04-22 DIAGNOSIS — R519 Headache, unspecified: Secondary | ICD-10-CM | POA: Diagnosis present

## 2023-04-22 DIAGNOSIS — R200 Anesthesia of skin: Secondary | ICD-10-CM | POA: Diagnosis not present

## 2023-04-22 DIAGNOSIS — H538 Other visual disturbances: Secondary | ICD-10-CM | POA: Diagnosis not present

## 2023-04-22 DIAGNOSIS — I10 Essential (primary) hypertension: Secondary | ICD-10-CM | POA: Insufficient documentation

## 2023-04-22 DIAGNOSIS — Z8673 Personal history of transient ischemic attack (TIA), and cerebral infarction without residual deficits: Secondary | ICD-10-CM | POA: Insufficient documentation

## 2023-04-22 DIAGNOSIS — Z79899 Other long term (current) drug therapy: Secondary | ICD-10-CM | POA: Insufficient documentation

## 2023-04-22 LAB — CBC WITH DIFFERENTIAL/PLATELET
Abs Immature Granulocytes: 0.03 10*3/uL (ref 0.00–0.07)
Basophils Absolute: 0 10*3/uL (ref 0.0–0.1)
Basophils Relative: 0 %
Eosinophils Absolute: 0.2 10*3/uL (ref 0.0–0.5)
Eosinophils Relative: 2 %
HCT: 42.5 % (ref 36.0–46.0)
Hemoglobin: 14.1 g/dL (ref 12.0–15.0)
Immature Granulocytes: 0 %
Lymphocytes Relative: 22 %
Lymphs Abs: 2 10*3/uL (ref 0.7–4.0)
MCH: 27.8 pg (ref 26.0–34.0)
MCHC: 33.2 g/dL (ref 30.0–36.0)
MCV: 83.7 fL (ref 80.0–100.0)
Monocytes Absolute: 0.5 10*3/uL (ref 0.1–1.0)
Monocytes Relative: 5 %
Neutro Abs: 6.4 10*3/uL (ref 1.7–7.7)
Neutrophils Relative %: 71 %
Platelets: 298 10*3/uL (ref 150–400)
RBC: 5.08 MIL/uL (ref 3.87–5.11)
RDW: 13.7 % (ref 11.5–15.5)
WBC: 9 10*3/uL (ref 4.0–10.5)
nRBC: 0 % (ref 0.0–0.2)

## 2023-04-22 LAB — COMPREHENSIVE METABOLIC PANEL WITH GFR
ALT: 20 U/L (ref 0–44)
AST: 18 U/L (ref 15–41)
Albumin: 4.4 g/dL (ref 3.5–5.0)
Alkaline Phosphatase: 70 U/L (ref 38–126)
Anion gap: 8 (ref 5–15)
BUN: 16 mg/dL (ref 6–20)
CO2: 28 mmol/L (ref 22–32)
Calcium: 9.8 mg/dL (ref 8.9–10.3)
Chloride: 103 mmol/L (ref 98–111)
Creatinine, Ser: 0.52 mg/dL (ref 0.44–1.00)
GFR, Estimated: >60 mL/min (ref >60)
Glucose, Bld: 95 mg/dL (ref 70–99)
Potassium: 4 mmol/L (ref 3.5–5.1)
Sodium: 139 mmol/L (ref 135–145)
Total Bilirubin: 0.4 mg/dL (ref 0.0–1.2)
Total Protein: 6.9 g/dL (ref 6.5–8.1)

## 2023-04-22 MED ORDER — IOHEXOL 350 MG/ML SOLN
100.0000 mL | Freq: Once | INTRAVENOUS | Status: AC | PRN
  Administered 2023-04-22: 100 mL via INTRAVENOUS

## 2023-04-22 MED ORDER — DIPHENHYDRAMINE HCL 50 MG/ML IJ SOLN
25.0000 mg | Freq: Once | INTRAMUSCULAR | Status: AC
  Administered 2023-04-22: 25 mg via INTRAVENOUS
  Filled 2023-04-22: qty 1

## 2023-04-22 MED ORDER — PROCHLORPERAZINE EDISYLATE 10 MG/2ML IJ SOLN
10.0000 mg | Freq: Once | INTRAMUSCULAR | Status: AC
  Administered 2023-04-22: 10 mg via INTRAVENOUS
  Filled 2023-04-22: qty 2

## 2023-04-22 NOTE — ED Triage Notes (Addendum)
 1000AM notice sudden onset shocking pain in right eye into head. Left eye dilated-reactive. Vision blurry. Right side face seems to be weaker with 'smile test'. 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

## 2023-04-23 ENCOUNTER — Emergency Department (HOSPITAL_COMMUNITY)

## 2023-04-23 DIAGNOSIS — I10 Essential (primary) hypertension: Secondary | ICD-10-CM | POA: Diagnosis not present

## 2023-04-23 DIAGNOSIS — R200 Anesthesia of skin: Secondary | ICD-10-CM | POA: Diagnosis not present

## 2023-04-23 DIAGNOSIS — Z79899 Other long term (current) drug therapy: Secondary | ICD-10-CM | POA: Diagnosis not present

## 2023-04-23 DIAGNOSIS — H538 Other visual disturbances: Secondary | ICD-10-CM | POA: Diagnosis not present

## 2023-04-23 DIAGNOSIS — Z8673 Personal history of transient ischemic attack (TIA), and cerebral infarction without residual deficits: Secondary | ICD-10-CM | POA: Diagnosis not present

## 2023-04-23 DIAGNOSIS — R519 Headache, unspecified: Secondary | ICD-10-CM | POA: Diagnosis present

## 2023-04-23 MED ORDER — TETRACAINE HCL 0.5 % OP SOLN
2.0000 [drp] | Freq: Once | OPHTHALMIC | Status: AC
  Administered 2023-04-23: 2 [drp] via OPHTHALMIC
  Filled 2023-04-23: qty 4

## 2023-04-23 MED ORDER — LORAZEPAM 2 MG/ML IJ SOLN: 1.0000 mg | Freq: Once | INTRAMUSCULAR | Status: DC

## 2023-04-23 MED ORDER — HALOPERIDOL LACTATE 5 MG/ML IJ SOLN
2.0000 mg | Freq: Once | INTRAMUSCULAR | Status: AC
  Administered 2023-04-23: 2 mg via INTRAVENOUS
  Filled 2023-04-23: qty 1

## 2023-04-23 MED ORDER — KETOROLAC TROMETHAMINE 30 MG/ML IJ SOLN
30.0000 mg | Freq: Once | INTRAMUSCULAR | Status: AC
  Administered 2023-04-23: 30 mg via INTRAVENOUS
  Filled 2023-04-23: qty 1

## 2023-04-23 MED ORDER — GADOBUTROL 1 MMOL/ML IV SOLN
10.0000 mL | Freq: Once | INTRAVENOUS | Status: AC | PRN
  Administered 2023-04-23: 10 mL via INTRAVENOUS

## 2023-04-23 MED ORDER — LORAZEPAM 2 MG/ML IJ SOLN
1.0000 mg | Freq: Once | INTRAMUSCULAR | Status: AC | PRN
  Administered 2023-04-23: 1 mg via INTRAVENOUS
  Filled 2023-04-23: qty 1
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
## 2023-04-23 NOTE — Discharge Instructions (Addendum)
 Your Neurologist.

## 2023-04-23 NOTE — ED Provider Notes (Signed)
 Pt transferred from med center for MRI brain.  MRI without acute findings.  Plan to d/c with outpatient neurology follow up.  Patient's headache improved after medications in the emergency department.     Tilden Fossa, MD 04/23/23 [phone]

## 2023-04-23 NOTE — ED Notes (Signed)
 Patient transported to MRI

## 2023-04-23 NOTE — ED Notes (Signed)
 Patient arrived via [facility] from [facility] for MRI.  Patient c/o migraine PTA, has resolved slightly since med admin at [facility].

## 2023-04-24 ENCOUNTER — Encounter: Payer: Self-pay | Admitting: Hematology

## 2023-07-12 ENCOUNTER — Ambulatory Visit: Admitting: Neurology

## 2023-07-12 ENCOUNTER — Encounter: Payer: Self-pay | Admitting: Neurology

## 2023-07-12 VITALS — BP 141/76 | HR 69 | Ht 63.0 in | Wt 255.2 lb

## 2023-07-12 DIAGNOSIS — G4730 Sleep apnea, unspecified: Secondary | ICD-10-CM

## 2023-07-12 DIAGNOSIS — G43709 Chronic migraine without aura, not intractable, without status migrainosus: Secondary | ICD-10-CM | POA: Diagnosis not present

## 2023-07-12 DIAGNOSIS — R194 Change in bowel habit: Secondary | ICD-10-CM | POA: Insufficient documentation

## 2023-07-12 NOTE — Patient Instructions (Addendum)
 ASA 81mg     Discussed:  There is increased risk for stroke in women with migraine with aura and a contraindication for the combined contraceptive pill for use by women who have migraine with aura. The risk for women with migraine without aura is lower. However other risk factors like smoking are far more likely to increase stroke risk than migraine. There is a recommendation for no smoking and for the use of OCPs without estrogen such as progestogen only pills particularly for women with migraine with aura.SABRA People who have migraine headaches with auras may be 3 times more likely to have a stroke caused by a blood clot, compared to migraine patients who don't see auras. Women who take hormone-replacement therapy may be 30 percent more likely to suffer a clot-based stroke than women not taking medication containing estrogen. Other risk factors like smoking and high blood pressure may be  much more important. And stroke is still a rare complication due to migraine aura and is controversial and lower doses may not cause a risk.
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
## 2023-07-17 ENCOUNTER — Other Ambulatory Visit: Payer: Self-pay | Admitting: Neurology

## 2023-07-17 ENCOUNTER — Encounter: Payer: Self-pay | Admitting: Neurology

## 2023-07-17 DIAGNOSIS — R519 Headache, unspecified: Secondary | ICD-10-CM

## 2023-07-17 DIAGNOSIS — G4733 Obstructive sleep apnea (adult) (pediatric): Secondary | ICD-10-CM

## 2023-07-17 DIAGNOSIS — R5383 Other fatigue: Secondary | ICD-10-CM

## 2023-08-12 ENCOUNTER — Institutional Professional Consult (permissible substitution): Admitting: Neurology

## 2023-08-13 ENCOUNTER — Encounter: Payer: Self-pay | Admitting: Neurology

## 2023-08-13 ENCOUNTER — Ambulatory Visit (INDEPENDENT_AMBULATORY_CARE_PROVIDER_SITE_OTHER): Admitting: Neurology

## 2023-08-13 VITALS — BP 163/95 | HR 94 | Ht 63.0 in | Wt 265.4 lb

## 2023-08-13 DIAGNOSIS — I6930 Unspecified sequelae of cerebral infarction: Secondary | ICD-10-CM

## 2023-08-13 DIAGNOSIS — R140 Abdominal distension (gaseous): Secondary | ICD-10-CM | POA: Insufficient documentation

## 2023-08-13 DIAGNOSIS — R0683 Snoring: Secondary | ICD-10-CM

## 2023-08-13 DIAGNOSIS — G43109 Migraine with aura, not intractable, without status migrainosus: Secondary | ICD-10-CM

## 2023-08-13 DIAGNOSIS — Z6841 Body Mass Index (BMI) 40.0 and over, adult: Secondary | ICD-10-CM

## 2023-08-13 DIAGNOSIS — R0602 Shortness of breath: Secondary | ICD-10-CM

## 2023-08-13 DIAGNOSIS — F4310 Post-traumatic stress disorder, unspecified: Secondary | ICD-10-CM

## 2023-08-13 DIAGNOSIS — Z8673 Personal history of transient ischemic attack (TIA), and cerebral infarction without residual deficits: Secondary | ICD-10-CM

## 2023-08-13 MED ORDER — ALPRAZOLAM 0.5 MG PO TABS: 0.5000 mg | ORAL_TABLET | Freq: Every evening | ORAL | 0 refills | Status: DC | PRN
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
## 2023-08-13 NOTE — Patient Instructions (Addendum)
 Fatigue :Fatigue If you have fatigue, you feel tired all the time and have a lack of energy or a lack of motivation. Fatigue may make it difficult to start or complete tasks because of exhaustion. Occasional or mild fatigue is often a normal response to activity or life. However, long-term (chronic) or extreme fatigue may be a symptom of a medical condition such as: Depression. Not having enough red blood cells or hemoglobin in the blood (anemia). A problem with a small gland located in the lower front part of the neck (thyroid disorder). Rheumatologic conditions. These are problems related to the body's defense system (immune system). Infections, especially certain viral infections. Fatigue can also lead to negative health outcomes over time. Follow these instructions at home: Medicines Take over-the-counter and prescription medicines only as told by your health care provider. Take a multivitamin if told by your health care provider. Do not use herbal or dietary supplements unless they are approved by your health care provider. Eating and drinking  Avoid heavy meals in the evening. Eat a well-balanced diet, which includes lean proteins, whole grains, plenty of fruits and vegetables, and low-fat dairy products. Avoid eating or drinking too many products with caffeine in them. Avoid alcohol. Drink enough fluid to keep your urine pale yellow. Activity  Exercise regularly, as told by your health care provider. Use or practice techniques to help you relax, such as yoga, tai chi, meditation, or massage therapy. Lifestyle Change situations that cause you stress. Try to keep your work and personal schedules in balance. Do not use recreational or illegal drugs. General instructions Monitor your fatigue for any changes. Go to bed and get up at the same time every day. Avoid fatigue by pacing yourself during the day and getting enough sleep at night. Maintain a healthy weight. Contact a health  care provider if: Your fatigue does not get better. You have a fever. You suddenly lose or gain weight. You have headaches. You have trouble falling asleep or sleeping through the night. You feel angry, guilty, anxious, or sad. You have swelling in your legs or another part of your body. Get help right away if: You feel confused, feel like you might faint, or faint. Your vision is blurry or you have a severe headache. You have severe pain in your abdomen, your back, or the area between your waist and hips (pelvis). You have chest pain, shortness of breath, or an irregular or fast heartbeat. You are unable to urinate, or you urinate less than normal. You have abnormal bleeding from the rectum, nose, lungs, nipples, or, if you are female, the vagina. You vomit blood. You have thoughts about hurting yourself or others. These symptoms may be an emergency. Get help right away. Call 911. Do not wait to see if the symptoms will go away. Do not drive yourself to the hospital. Get help right away if you feel like you may hurt yourself or others, or have thoughts about taking your own life. Go to your nearest emergency room or: Call 911. Call the National Suicide Prevention Lifeline at 973-152-0384 or 988. This is open 24 hours a day. Text the Crisis Text Line at 304-169-8463. Summary If you have fatigue, you feel tired all the time and have a lack of energy or a lack of motivation. Fatigue may make it difficult to start or complete tasks because of exhaustion. Long-term (chronic) or extreme fatigue may be a symptom of a medical condition. Exercise regularly, as told by your health care  provider. Change situations that cause you stress. Try to keep your work and personal schedules in balance. This information is not intended to replace advice given to you by your health care provider. Make sure you discuss any questions you have with your health care provider. Document Revised: 10/24/2020 Document  Reviewed: 10/24/2020 Elsevier Patient Education  2024 Elsevier Inc.   Managing Post-Traumatic Stress Disorder If you have been diagnosed with post-traumatic stress disorder (PTSD), you may be relieved that you now know why you have felt or behaved a certain way. Still, you may feel overwhelmed and concerned for your future.  With the right treatment and support, you can live your life with fewer symptoms. What actions can I take to manage PTSD? Manage stress Stress is your body's reaction to life changes and events, both good and bad. Stress can make PTSD worse. Take the following steps to manage stress: Talk with your health care provider or a counselor about ways to lower your stress. These may include: Physical exercise. Meditation, yoga, or other mind-body exercises. Exercises to relax your muscles. Breathing exercises. Listening to quiet music. Spending time outside. Eat a healthy diet. Get plenty of sleep. Take time to relax. Spend time with others. Talk with them about how you are feeling and what you need. Do activities that you enjoy. Have a schedule and take regular breaks. Take your medicines Symptoms of PTSD can make you feel depressed and anxious. They can also disrupt reality. Medicines can help lessen symptoms and make you feel better. It is important to: Talk with your pharmacist or health care provider about all medicines you take. Talk about the side effects. Discuss which medicines are safe to take together. Be involved in your care. Decide on the best treatment plan with your health care provider. Be aware that if you decide to take medicine, it can take 4-8 weeks before you notice a difference in your symptoms. Talk to your health care provider before stopping medicines. Stay close to family and friends PTSD can affect relationships. Make an effort to: Trust your friends and loved ones. Trusting others can help you feel safe and connect you with emotional  support. Be open and honest about your feelings. Have fun and relax in safe spaces, such as with friends and family. Think about going to couples counseling, family education classes, or family therapy. Therapy can be helpful for everyone. Talk with friends and family about how they can best support you. What are signs that my condition is getting worse? Be aware of your symptoms and how often you have them. The following symptoms mean that you may need additional help: You feel suspicious and angry. You have repeated flashbacks. You avoid going out or being with others. You have more fights with close friends or family members. You have thoughts about hurting yourself or others. You cannot get relief from feelings of depression or anxiety. Follow these instructions at home: Lifestyle Exercise at least 30 minutes a few times a week. Try to get 7-9 hours of sleep each night. To help with sleep: Keep your bedroom cool and dark. Do not eat a heavy meal within 1 hour of bedtime. Avoid caffeine for at least 8 hours before bed. Avoid screen time before bed. This includes television, computers, tablets, and mobile phones. Try to have fun and find humor in your life. Eat a healthy diet. Write your thoughts and feelings down in a journal or on a tablet or mobile phone. General instructions Take over-the-counter  and prescription medicines only as told by your health care provider. Do not useillegal drugs. Know and remember that healing from trauma takes time. Keep your health care team up to date about your PTSD symptoms and treatment. Alcohol use Do not drink alcohol if: Your health care provider tells you not to drink. You are pregnant, may be pregnant, or are planning to become pregnant. If you drink alcohol: Limit how much you have to: 0-1 drink a day for women. 0-2 drinks a day for men. Know how much alcohol is in your drink. In the [place] one drink equals one 12 oz bottle of beer (355  mL), one 5 oz glass of wine (148 mL), or one 1 oz glass of hard liquor (44 mL). Where to find more information For more information about PTSD, treatment, and how to get support, go to: [employer]: BloggerCourse.com National Center for PTSD: ptsd.FitBoxer.tn Contact a health care provider if: Your symptoms get worse or do not get better. You have trouble doing daily tasks. You have loss of appetite. You have trouble sleeping. Get help right away if: You have thoughts about hurting yourself or others. Get help right away if you feel like you may hurt yourself or others, or have thoughts about taking your own life. Go to your nearest emergency room or: Call 911. Call the National Suicide Prevention Lifeline at 551-102-5744 or 988. This is open 24 hours a day. Text the Crisis Text Line at (918)844-1734. Summary With the right treatment and support, you can live your life with fewer symptoms. Find supportive people and settings. Spend time in those places. Keep in contact with those people. Work with your health care team to create a plan to manage the symptoms of PTSD. The plan should include counseling, good habits, and techniques to reduce stress. This information is not intended to replace advice given to you by your health care provider. Make sure you discuss any questions you have with your health care provider. Document Revised: 04/21/2021 Document Reviewed: 04/21/2021 Elsevier Patient Education  2024 Elsevier Inc.  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  Having history of mild OSA  with REM dependence. CPAP initially reduced migraine headaches. She  couldn't breathe on CPAP after COVID - and quit    2) Chronic migraines are sometimes  manifesting as Cluster at night and some mornings as dull headaches that then become migrainous.  Nausea, photophobia,  Olfactory triggers and sounds  are  present.    3) OSA  Risk factors are still present : BMI , large neck and high grade Mallampati.    4) high fatigue can be related to chronic pain, anxiety, depression and autoimmune disorders. Plaquenil prescribed by rheumatologist.    5) social situation is not without anxiety, due to behaviour challenges. eldest son has been in prison,  one son with autism, a lot of drug related illness    SLEEP STUDY NEEDS TO BE IN LAB, XANAX  0.5 mg ordered.      I plan to follow up either personally or through our NP within  months.    I would like to thank Leila Lucie LABOR, MD and Ines Onetha NOVAK, Md [address] [place],  [place] [address] for allowing me to meet with and to take care of this pleasant patient.    Discussion of sleep hygiene setting bedtime and rise time,  hot shower  before bed time, no screen light in the bedroom, the bedroom should be cool, quiet and dark. Night lights should illuminate the floor not shine into your eyes. Golden glow  light is less intrusive than blue or cold light.  Read in a book with pages, not on a device. Consider audio books and soothing  sound -scapes.

## 2023-08-16 ENCOUNTER — Telehealth: Payer: Self-pay | Admitting: Neurology

## 2023-08-16 NOTE — Telephone Encounter (Signed)
 08/16/23 sent mychart EE  08/15/23 aetna state no auth req EE

## 2023-08-19 NOTE — Telephone Encounter (Signed)
 Split Aet[employer] state no auth req pt to bring Xanax    Patient is scheduled at [facility] for 09/27/23 at 8 pm.  Mailed packet and sent mychart.

## 2023-09-16 ENCOUNTER — Encounter

## 2023-09-24 ENCOUNTER — Encounter: Payer: Self-pay | Admitting: Hematology

## 2023-09-27 ENCOUNTER — Ambulatory Visit: Admitting: Neurology

## 2023-09-27 DIAGNOSIS — Z8673 Personal history of transient ischemic attack (TIA), and cerebral infarction without residual deficits: Secondary | ICD-10-CM

## 2023-09-27 DIAGNOSIS — R0683 Snoring: Secondary | ICD-10-CM | POA: Diagnosis not present

## 2023-09-27 DIAGNOSIS — F4310 Post-traumatic stress disorder, unspecified: Secondary | ICD-10-CM

## 2023-09-27 DIAGNOSIS — G43109 Migraine with aura, not intractable, without status migrainosus: Secondary | ICD-10-CM

## 2023-10-01 ENCOUNTER — Ambulatory Visit: Payer: Self-pay | Admitting: Nurse Practitioner

## 2023-10-01 ENCOUNTER — Encounter: Payer: Self-pay | Admitting: Nurse Practitioner

## 2023-10-01 VITALS — BP 132/92 | HR 74 | Temp 98.2°F | Resp 18 | Ht 64.0 in | Wt 275.0 lb

## 2023-10-01 DIAGNOSIS — F419 Anxiety disorder, unspecified: Secondary | ICD-10-CM

## 2023-10-01 DIAGNOSIS — F4310 Post-traumatic stress disorder, unspecified: Secondary | ICD-10-CM

## 2023-10-01 DIAGNOSIS — F331 Major depressive disorder, recurrent, moderate: Secondary | ICD-10-CM | POA: Diagnosis not present

## 2023-10-01 NOTE — Progress Notes (Unsigned)
   Subjective:  Follow-up (Behavioral Health/psych issue)    HPI: [name] is a 48 y.o. female presenting on 10/01/2023 with report of ***  Nerve block and ablsian   ROS: Negative unless specifically indicated above in HPI.   Relevant past medical history reviewed and updated as indicated.   Allergies and medications reviewed and updated.   Current Outpatient Medications  Medication Instructions  . Ajovy  225 mg, Subcutaneous, Every 30 days  . ALPRAZolam  (XANAX ) 0.5 mg, Oral, At bedtime PRN  . DULoxetine  (CYMBALTA ) 60 mg, Oral, Daily  . hydroxychloroquine (PLAQUENIL) 400 mg, Daily  . traZODone  (DESYREL ) 50 mg, At bedtime PRN  . Ubrelvy  100 mg, Oral, Daily PRN, Take one tablet at onset of headache, may repeat 1 tablet in 2 hours, no more than 2 tablets in 24 hours.  . venlafaxine  XR (EFFEXOR -XR) 37.5 mg, Oral, Daily with breakfast, Take until gone and then stop     Allergies  Allergen Reactions  . Hydrocodone      Unsure if true allergy   . Topiramate     Lost all feeling in upper and lower extremities for 6 months    Objective:   BP (!) 132/92 (BP Location: Right Arm, Patient Position: Sitting, Cuff Size: Large)   Pulse 74   Temp 98.2 F (36.8 C) (Temporal)   Resp 18   Ht 5' 4 (1.626 m)   Wt 275 lb (124.7 kg)   LMP 05/31/2017   SpO2 99%   BMI 47.20 kg/m    Physical Exam Constitutional:      Appearance: Normal appearance.  HENT:     Head: Normocephalic.  Eyes:     Conjunctiva/sclera: Conjunctivae normal.  Cardiovascular:     Rate and Rhythm: Normal rate and regular rhythm.  Pulmonary:     Effort: Pulmonary effort is normal.     Breath sounds: Normal breath sounds.  Skin:    General: Skin is warm and dry.  Neurological:     General: No focal deficit present.     Mental Status: She is alert and oriented to person, place, and time.  Psychiatric:        Mood and Affect: Mood normal.        Behavior: Behavior normal.        Thought Content: Thought  content normal.        Judgment: Judgment normal.     Eye Contact:  {BHH EYE CONTACT:22684}  Speech:  {Speech:22685}  Volume:  {Volume (PAA):22686}  Mood:  {BHH MOOD:22306}  Affect:  {Affect (PAA):22687}  Thought Process:  {Thought Process (PAA):22688}  Orientation:  {BHH ORIENTATION (PAA):22689}  Thought Content:  {Thought Content:22690}  Suicidal Thoughts:  {ST/HT (PAA):22692}  Homicidal Thoughts:  {ST/HT (PAA):22692}  Memory:  {BHH MEMORY:22881}  Judgement:  {Judgement (PAA):22694}  Insight:  {Insight (PAA):22695}  Psychomotor Activity:  {Psychomotor (PAA):22696}  Concentration:  {Concentration:21399}  Recall:  {BHH GOOD/FAIR/POOR:22877}  Fund of Knowledge:  {BHH GOOD/FAIR/POOR:22877}  Language:  {BHH GOOD/FAIR/POOR:22877}  Akathisia:  {BHH YES OR NO:22294}  Handed:  {Handed:22697}  AIMS (if indicated):     Assets:  {Assets (PAA):22698}  ADL's:  {BHH JIO'D:77709}  Cognition:  {chl bhh cognition:304700322}  Sleep:        Assessment & Plan:   Assessment & Plan     Follow up plan: No follow-ups on file.  Florencia Cousin, NP

## 2023-10-04 ENCOUNTER — Encounter: Payer: Self-pay | Admitting: Neurology

## 2023-10-04 DIAGNOSIS — F331 Major depressive disorder, recurrent, moderate: Secondary | ICD-10-CM | POA: Insufficient documentation

## 2023-10-04 NOTE — Assessment & Plan Note (Addendum)
 Duloxetine  60 mg PO BID CBT

## 2023-10-04 NOTE — Assessment & Plan Note (Addendum)
 Duloxetine  60 mg PO BID Rexulti 2 mg PO QHS

## 2023-10-04 NOTE — Assessment & Plan Note (Addendum)
 Trazodone  50 mg PO at bedtime  CBT

## 2023-10-11 ENCOUNTER — Ambulatory Visit: Payer: Self-pay | Admitting: Neurology

## 2023-10-11 DIAGNOSIS — G43109 Migraine with aura, not intractable, without status migrainosus: Secondary | ICD-10-CM

## 2023-10-11 DIAGNOSIS — Z8673 Personal history of transient ischemic attack (TIA), and cerebral infarction without residual deficits: Secondary | ICD-10-CM

## 2023-10-11 DIAGNOSIS — R0602 Shortness of breath: Secondary | ICD-10-CM

## 2023-10-11 DIAGNOSIS — G4733 Obstructive sleep apnea (adult) (pediatric): Secondary | ICD-10-CM

## 2023-10-11 NOTE — Procedures (Signed)
 [facility]   SPLIT NIGHT INTERPRETATION REPORT STUDY DATE: 09/27/2023     PATIENT NAME:  [name]         DATE OF BIRTH:  03-23-1975  PATIENT ID:  [id number]    TYPE OF STUDY:  SPLIT  Referring  PHYSICIAN: Onetha Epp, MD  SCORING TECHNICIAN: Delon Sprung   INDICATIONS:  This 48 year-old Female has been a CPAP user in the past and discontinued OSA therapy after contracting COVID in 2021/ 22. The patient has chronic migraines, nocturia, CVA, fibromyalgia, celiac disease, GERD, struggles with Obesity.  Family history of OSA affecting both parents. The Epworth Sleepiness Scale was endorsed at 10 out of 24, FSS at 63/63.   DESCRIPTION: A sleep technologist was in attendance for the duration of the recording.  Data collection, scoring, video monitoring, and reporting were performed in compliance with the AASM Manual for the Scoring of Sleep and Associated Events; (Hypopnea is scored based on the criteria listed in Section VIII D. 1b in the AASM Manual V2.6 using a 4% oxygen desaturation rule or Hypopnea is scored based on the criteria listed in Section VIII D. 1a in the AASM Manual V2.6 using 3% oxygen desaturation and /or arousal rule).  A board certified physician (Sleep Medicine) reviewed each epoch of the study.  ADDITIONAL INFORMATION:  Height: 63.0 in Weight: 265 lb (BMI 46) Neck Size: 16.5 in Medications: Cymbalta , Ajovy , Plaquenil, desyrel , Ubrelvy , Effexor .   STUDY DETAILS: Lights off was at 21:24: and lights on 05:12: (468 minutes hours in bed). This study was performed with an initial diagnostic portion followed by positive airway pressure titration.  Part 1: DIAGNOSTIC STUDY   SLEEP CONTINUITY AND SLEEP ARCHITECTURE:  The diagnostic portion of the study began at 21:24 and ended at 00:28, for a recording time of 3h 3.7m minutes.  Total sleep time was 153 minutes with a sleep efficiency at 83.7%.  Sleep latency was 18.0 minutes. REM sleep  latency was 45.5 minutes.   Arousal index was 13.3 /hr. Of the total sleep time, the percentage of stage N1 sleep was 3.6%, stage N2 sleep was 67.8%, stage N3 sleep was 4.9%, and REM sleep was 23.8%.  There were 3 Stage R periods observed during this portion of the study, 11 awakenings (i.e. transitions to Stage W from any sleep stage), and 53.0 total stage transitions. Wake after sleep onset (WASO) time accounted for 12 minutes.   AROUSAL (Baseline): There were 12.9  arousals/hour.  Of these, 18.0 were identified as respiratory-related arousals (7.0 /hr), 0 were PLM-related arousals (0.0 /hr), and 15 were non-specific arousals (5.9 /hr)   RESPIRATORY MONITORING( Baseline) :    Based on CMS criteria (using a 4% oxygen desaturation rule for scoring hypopneas), there were 0 apneas (0 obstructive; 0 central; 0 mixed), and 226 hypopneas.  Apnea index was 0.0. Hypopnea index was 64.5/h. The AHI (apnea-hypopnea index) was 64.5/h overall (64.5 supine; 51.0 all supine REM). There were 0 respiratory effort-related arousals (RERAs).     Based on AASM criteria (using a 3% oxygen desaturation and /or arousal rule for scoring hypopneas), there were 0 apneas (0 obstructive; 0 central; 0 mixed), and 226 hypopneas.   Apnea index was 0.0. Hypopnea index was 64.9/h. The AHI ( apnea-hypopnea index) was 64.9/h overall (64.9 supine; 52.6/h supine REM). There were 0 respiratory effort-related arousals (RERAs).    LIMB MOVEMENTS: There were 6 periodic limb movements of sleep (2.3/hr), of which 0 (0.0/hr) were associated with an  arousal.   OXIMETRY: Total sleep time spent at, or below 88% was 28.5 minutes, or 18.6% of total sleep time. Respiratory events were associated with oxyhemoglobin desaturations (nadir 68%) from a normal baseline (mean 95%).  Total time spent below 89% 02 saturation was 30.0 minutes, or 19.6%  of total sleep time. (!)    BODY POSITION: Duration of total sleep and percent of total sleep in their  respective position is as follows: supine 153 minutes (100.0%). Total supine REM sleep time was 36 minutes minutes (100.0% of total REM sleep).   Analysis of electrocardiogram activity showed the highest heart rate for the baseline portion of the study was 98.0 beats per minute.  The average heart rate during sleep was 79 bpm, while the highest heart rate for the same period was 98 bpm.    PART 2- TREATMENT by PAP SLEEP CONTINUITY AND SLEEP ARCHITECTURE:      The treatment portion of the study began at 00:28 and ended at 05:12, for a recording time of 4 h 44.25m minutes. Total sleep time was 246 minutes (50.4% supine;  49.6% lateral; 0.0% prone, 35.0% REM sleep), with a normal sleep efficiency at 86.5%.  Sleep latency was now 0.0 minutes. REM sleep latency was normal at 53.5 minutes.     Of the total sleep time, the percentage of stage N1 sleep was 3.7%, stage N3 sleep was 7.7%, and REM sleep was 35.0%. There were 6 Stage R periods observed during this portion of the study, 18 awakenings (i.e. transitions to Stage W from any sleep stage), and 61.0 total stage transitions. Wake after sleep onset (WASO) time accounted for 38 minutes.   AROUSAL:   Arousal index was 8 /hr.  RESPIRATORY MONITORING:  A Vitera Medium sized FFM was provided and used: after applying CPAP pressures from 4 through 12 cm water and EPR 3 cm water pressure,  there was a significant reduction in arousals and AHI.    While on PAP therapy, based on CMS criteria, the AHI (apnea-hypopnea index) was 14.9/h overall (9.0 supine; 4.9 REM). There were 0 apneas and 62 hypopneas.      While on PAP therapy, based on AASM criteria, the AHI ( apnea-hypopnea index)  was 15.1/h overall (9.0 supine; 5.1 REM).    LIMB MOVEMENTS: There were 7 periodic limb movements of sleep (1.7/hr), of which 1 (0.2/hr) were associated with an arousal.   OXIMETRY: Total sleep time spent at, or below 88% was 0.9 minutes, or 0.4% of total sleep time.   Respiratory events were associated with oxyhemoglobin desaturation (nadir 82.0%) from a mean of 97.0%.  Total time spent at, or below 88% was 6.1 minutes, or 2.5%  of total sleep time.    BODY POSITION: Duration of total sleep and percent of total sleep in their respective position is as follows: supine 124 minutes (50.4%), non-supine 122.0 minutes (49.6%); right 28 minutes minutes (11.6%), left 93 minutes minutes (38.0%), and prone 00 minutes minutes (0.0%). Total supine REM sleep time was 33 minutes minutes (39.0% of total REM sleep).   EKG: The electrocardiogram documented normal sinus rhythm.  Analysis of electrocardiogram activity showed the highest heart rate for the treatment portion of the study was 99.0 beats per minute.  The average heart rate during sleep was 68 bpm, while the highest heart rate for the same period was 86 bpm.       IMPRESSION: The patient presented with severe sleep apnea, OSA - AHI was  65/h , most events  were hypopneas ( shallow breathing, hypoventilation)  and clinically significant hypoxia was associated ( nadir at 68% oxygen and for 30 miutes total sleep time ( about 20% of the baseline part of the study) . There were no central events.  The baseline part established the diagnosis of hypoventilation, severe OSA and sleep hypoxia.   Titration under an EVORA FFM by Gasper and Paykel allowed exploration of CPAP pressures from 4- 12 cm water, 3 cm EPR and reached an AHI of  2/h.    RECOMMENDATIONS: The above named setting of 12 cm water with 3 cm EPR showed the most benefit. An auto-titration ResMed CPAP device with a setting from 6 through 14 cm water will be provided, 3 cm EPR, heated humidification, Evora mask medium size if the patient finds this comfortable.      DEDRA GORES, MD

## 2023-10-17 NOTE — Telephone Encounter (Signed)
 Safiyah Cisney D, CMA  Joylene Weinrich; Garcia, Patricia; Ziegler, Melissa; Tucker, Dolanda; Cain, [place] New orders have been placed for the above pt, DOB: Aug 22, 1975 Thanks

## 2023-10-29 ENCOUNTER — Encounter (HOSPITAL_BASED_OUTPATIENT_CLINIC_OR_DEPARTMENT_OTHER): Payer: Self-pay

## 2023-10-29 ENCOUNTER — Emergency Department (HOSPITAL_BASED_OUTPATIENT_CLINIC_OR_DEPARTMENT_OTHER)
Admission: EM | Admit: 2023-10-29 | Discharge: 2023-10-29 | Disposition: A | Discharge status: Home / Self Care | Attending: Emergency Medicine | Admitting: Emergency Medicine

## 2023-10-29 ENCOUNTER — Ambulatory Visit: Admitting: Podiatry

## 2023-10-29 ENCOUNTER — Other Ambulatory Visit: Payer: Self-pay

## 2023-10-29 DIAGNOSIS — M79671 Pain in right foot: Secondary | ICD-10-CM

## 2023-10-29 DIAGNOSIS — Z23 Encounter for immunization: Secondary | ICD-10-CM | POA: Diagnosis not present

## 2023-10-29 DIAGNOSIS — L600 Ingrowing nail: Secondary | ICD-10-CM | POA: Diagnosis not present

## 2023-10-29 DIAGNOSIS — S99921A Unspecified injury of right foot, initial encounter: Secondary | ICD-10-CM | POA: Insufficient documentation

## 2023-10-29 DIAGNOSIS — W548XXA Other contact with dog, initial encounter: Secondary | ICD-10-CM | POA: Diagnosis not present

## 2023-10-29 DIAGNOSIS — S99929A Unspecified injury of unspecified foot, initial encounter: Secondary | ICD-10-CM

## 2023-10-29 MED ORDER — TETANUS-DIPHTH-ACELL PERTUSSIS 5-2-15.5 LF-MCG/0.5 IM SUSP
0.5000 mL | Freq: Once | INTRAMUSCULAR | Status: AC
  Administered 2023-10-29: 0.5 mL via INTRAMUSCULAR
  Filled 2023-10-29: qty 0.5

## 2023-10-29 MED ORDER — AMOXICILLIN-POT CLAVULANATE 875-125 MG PO TABS: 1.0000 | ORAL_TABLET | Freq: Two times a day (BID) | ORAL | 0 refills | Status: DC

## 2023-10-29 MED ORDER — AMOXICILLIN-POT CLAVULANATE 875-125 MG PO TABS
1.0000 | ORAL_TABLET | Freq: Once | ORAL | Status: AC
  Administered 2023-10-29: 1 via ORAL
  Filled 2023-10-29: qty 1

## 2023-10-29 NOTE — Progress Notes (Signed)
 Patient presents with an injury to the hallux nail right.  Dog ran over her toe dislodge it.SABRA  Has been painful since then and loose with some clear drainage.  She went to urgent care she is on antibiotic right now.  Taking amoxicillin.  Nail was giving her problems even before that the nails been very thickened dystrophic and has been bothering her for years.   Physical exam:  General appearance: Pleasant, and in no acute distress. AOx3.  Vascular: Pedal pulses: DP 2/4 bilaterally, PT 2/4 bilaterally. Mlld edema lower legs bilaterally. Capillary fill time immediate b/l.SABRA  Neurological: Light touch intact feet bilaterally.  Normal Achilles reflex bilaterally.  No clonus or spasticity noted.   Dermatologic:   Ingrown hallux nail on the right with nail partially lytic to nailbed.  Redness of the nail folds.  Serous drainage.  Skin normal temperature bilaterally.  Skin normal color, tone, and texture bilaterally.   Musculoskeletal: Tenderness hallux right.  Normal muscle strength lower extremity   Diagnosis: 1.  Pain foot right. 2.  Ingrown nail hallux right.  Plan: -New patient office visit for evaluation and management level 3.  Modifier 25. - Discussed with the ingrown nail is due to the trauma the nail would recommend avulsion of the nail and allow the area to heal.  When we see her in 2 weeks we could perform a matrixectomy safely to so that the nail does not grow back anymore.  -  Return 2 weeks to see if,f/u avulsion nail right and probable matricectomy hallux nail right

## 2023-10-29 NOTE — ED Triage Notes (Signed)
 Patient presents with injury tot he right greater toe. Patient  states her dog were jumping on her and ripped her toes nail off. Patient states the toes nail was already infected prior to today.

## 2023-10-29 NOTE — Patient Instructions (Signed)

## 2023-10-29 NOTE — ED Provider Notes (Signed)
 [place] EMERGENCY DEPARTMENT AT [facility] Provider Note   CSN: [id number] Arrival date & time: 10/29/23  9673     Patient presents with: Toe Pain   [name] is a 48 y.o. female.   The history is provided by the patient.  Toe Pain This is a new problem. The current episode started less than 1 hour ago. The problem occurs constantly. The problem has not changed since onset.Pertinent negatives include no chest pain, no abdominal pain, no headaches and no shortness of breath. Nothing aggravates the symptoms. Nothing relieves the symptoms. She has tried nothing for the symptoms. The treatment provided no relief.  2 of her dogs figthing and foot got caught between then and lifted the right great toenail which patient reports was not completely fixed.       Prior to Admission medications   Medication Sig Start Date End Date Taking? Authorizing Provider  amoxicillin-clavulanate (AUGMENTIN) 875-125 MG tablet Take 1 tablet by mouth every 12 (twelve) hours. 10/29/23  Yes Loraine Freid, MD  ALPRAZolam  (XANAX ) 0.5 MG tablet Take 1 tablet (0.5 mg total) by mouth at bedtime as needed (in sleep lab use). 08/13/23   Dohmeier, Dedra, MD  DULoxetine  (CYMBALTA ) 60 MG capsule Take 1 capsule (60 mg total) by mouth daily. 03/22/21   Leigh Corean Massa, MD  Fremanezumab -vfrm (AJOVY ) 225 MG/1.5ML SOSY Inject 225 mg into the skin every 30 (thirty) days. 03/17/19   Lomax, Amy, NP  hydroxychloroquine (PLAQUENIL) 200 MG tablet Take 400 mg by mouth daily. 07/26/23   [provider]  traZODone  (DESYREL ) 50 MG tablet Take 50 mg by mouth at bedtime as needed. 03/09/21   [provider]  Ubrogepant  (UBRELVY ) 100 MG TABS Take 100 mg by mouth daily as needed. Take one tablet at onset of headache, may repeat 1 tablet in 2 hours, no more than 2 tablets in 24 hours. 03/17/19   Lomax, Amy, NP  venlafaxine  XR (EFFEXOR -XR) 37.5 MG 24 hr capsule Take 1 capsule (37.5 mg total) by mouth daily  with breakfast. Take until gone and then stop 03/22/21   Leigh Corean Massa, MD    Allergies: Hydrocodone  and Topiramate    Review of Systems  Respiratory:  Negative for shortness of breath.   Cardiovascular:  Negative for chest pain.  Gastrointestinal:  Negative for abdominal pain.  Neurological:  Negative for headaches.  All other systems reviewed and are negative.   Updated Vital Signs BP 128/88   Pulse 70   Temp 97.7 F (36.5 C) (Oral)   Resp 16   Ht 5' 3 (1.6 m)   Wt 124.7 kg   LMP 05/31/2017   SpO2 97%   BMI 48.71 kg/m   Physical Exam Vitals and nursing note reviewed.  Constitutional:      General: She is not in acute distress.    Appearance: Normal appearance. She is well-developed.  HENT:     Head: Normocephalic and atraumatic.     Nose: Nose normal.  Eyes:     Pupils: Pupils are equal, round, and reactive to light.  Cardiovascular:     Rate and Rhythm: Normal rate and regular rhythm.     Pulses: Normal pulses.     Heart sounds: Normal heart sounds.  Pulmonary:     Effort: Pulmonary effort is normal. No respiratory distress.     Breath sounds: Normal breath sounds.  Abdominal:     General: Bowel sounds are normal. There is no distension.     Palpations:  Abdomen is soft.     Tenderness: There is no abdominal tenderness. There is no guarding or rebound.  Musculoskeletal:        General: Normal range of motion.     Cervical back: Normal range of motion and neck supple.       Feet:  Skin:    General: Skin is warm and dry.     Capillary Refill: Capillary refill takes less than 2 seconds.     Findings: No erythema or rash.  Neurological:     General: No focal deficit present.     Mental Status: She is alert and oriented to person, place, and time.     Deep Tendon Reflexes: Reflexes normal.  Psychiatric:        Mood and Affect: Mood normal.     (all labs ordered are listed, but only abnormal results are displayed) Labs Reviewed - No data to  display  EKG: None  Radiology: No results found.   Procedures   Medications Ordered in the ED  amoxicillin-clavulanate (AUGMENTIN) 875-125 MG per tablet 1 tablet (1 tablet Oral Given 10/29/23 0508)  Tdap (ADACEL) injection 0.5 mL (0.5 mLs Intramuscular Given 10/29/23 0508)                                    Medical Decision Making Ripped up R great toenail   Amount and/or Complexity of Data Reviewed External Data Reviewed: notes.    Details: Previous notes reviewed   Risk Prescription drug management. Risk Details: Wound irrigated and cleansed.  Matrix is gone, nothing to repair.  Nail is stable on sides and will leave in placed to protect underltying skin. Augmentin initiated due to dogs.  Tetanus updated.  Follow up with podiatry for ongoing care.  Stable for discharge.  Strict returns.       Final diagnoses:  Injury of great toenail   No signs of systemic illness or infection. The patient is nontoxic-appearing on exam and vital signs are within normal limits.  I have reviewed the triage vital signs and the nursing notes. Pertinent labs & imaging results that were available during my care of the patient were reviewed by me and considered in my medical decision making (see chart for details). After history, exam, and medical workup I feel the patient has been appropriately medically screened and is safe for discharge home. Pertinent diagnoses were discussed with the patient. Patient was given return precautions.   ED Discharge Orders          Ordered    amoxicillin-clavulanate (AUGMENTIN) 875-125 MG tablet  Every 12 hours        10/29/23 0430               Lamisha Roussell, MD 10/29/23 0532

## 2023-10-30 ENCOUNTER — Encounter: Payer: Self-pay | Admitting: Podiatry

## 2023-10-31 ENCOUNTER — Ambulatory Visit: Admitting: Nurse Practitioner

## 2023-10-31 ENCOUNTER — Encounter: Payer: Self-pay | Admitting: Nurse Practitioner

## 2023-11-14 ENCOUNTER — Telehealth: Payer: Self-pay | Admitting: Neurology

## 2023-11-14 NOTE — Telephone Encounter (Signed)
 Called Pt ,No answer , LVM FOR pt to call office to get Scheduled  Initial Cpap between ( 12-08-23 and  02-05-2024)

## 2023-11-19 ENCOUNTER — Ambulatory Visit: Admitting: Podiatry

## 2023-11-19 DIAGNOSIS — L600 Ingrowing nail: Secondary | ICD-10-CM

## 2023-11-19 NOTE — Progress Notes (Signed)
 Patient presents follow-up nail avulsion hallux right.  Doing well.  No drainage.  She said she would like to hold off on doing a permanent removal to see how the nail grows back however grows back and is still a problem she will have the nail taken off.   Physical exam:  General appearance: Pleasant, and in no acute distress. AOx3.  Vascular: Pedal pulses: DP 2/4 bilaterally, PT 2/4 bilaterally.  Minimal edema lower legs bilaterally. Capillary fill time immediate bilaterally.  Neurological: Intact bilaterally  Dermatologic:   Nail avulsion site hallux right healed.  No signs of infection.  Skin normal temperature bilaterally.  Skin normal color, tone, and texture bilaterally.   Musculoskeletal: Some tenderness distal aspect hallux right.    Diagnosis: 1.  Ingrown hallux nail right.  Plan: -Established office visit for evaluation and management.  Level 3. -Discussed with the dystrophic gryphotic ingrown nail hallux right.  I told that be fine just to see what it does I told them they will probably grow back the same way however she can wait and see what happens if it still grows back and is a problem recommend matrixectomy at that time.   Return here

## 2023-11-22 ENCOUNTER — Emergency Department (HOSPITAL_BASED_OUTPATIENT_CLINIC_OR_DEPARTMENT_OTHER)
Admission: EM | Admit: 2023-11-22 | Discharge: 2023-11-22 | Disposition: A | Discharge status: Home / Self Care | Source: Ambulatory Visit | Attending: Emergency Medicine | Admitting: Emergency Medicine

## 2023-11-22 ENCOUNTER — Encounter (HOSPITAL_BASED_OUTPATIENT_CLINIC_OR_DEPARTMENT_OTHER): Payer: Self-pay

## 2023-11-22 ENCOUNTER — Emergency Department (HOSPITAL_BASED_OUTPATIENT_CLINIC_OR_DEPARTMENT_OTHER): Admitting: Radiology

## 2023-11-22 ENCOUNTER — Emergency Department (HOSPITAL_BASED_OUTPATIENT_CLINIC_OR_DEPARTMENT_OTHER)

## 2023-11-22 ENCOUNTER — Other Ambulatory Visit: Payer: Self-pay

## 2023-11-22 DIAGNOSIS — I10 Essential (primary) hypertension: Secondary | ICD-10-CM | POA: Diagnosis not present

## 2023-11-22 DIAGNOSIS — R0602 Shortness of breath: Secondary | ICD-10-CM | POA: Diagnosis present

## 2023-11-22 LAB — BASIC METABOLIC PANEL WITH GFR
Anion gap: 11 (ref 5–15)
BUN: 11 mg/dL (ref 6–20)
CO2: 24 mmol/L (ref 22–32)
Calcium: 9.3 mg/dL (ref 8.9–10.3)
Chloride: 104 mmol/L (ref 98–111)
Creatinine, Ser: 0.53 mg/dL (ref 0.44–1.00)
GFR, Estimated: >60 mL/min (ref >60)
Glucose, Bld: 95 mg/dL (ref 70–99)
Potassium: 3.8 mmol/L (ref 3.5–5.1)
Sodium: 139 mmol/L (ref 135–145)

## 2023-11-22 LAB — CBC
HCT: 33.3 % — ABNORMAL LOW (ref 36.0–46.0)
Hemoglobin: 11.1 g/dL — ABNORMAL LOW (ref 12.0–15.0)
MCH: 27.8 pg (ref 26.0–34.0)
MCHC: 33.3 g/dL (ref 30.0–36.0)
MCV: 83.3 fL (ref 80.0–100.0)
Platelets: 243 K/uL (ref 150–400)
RBC: 4 MIL/uL (ref 3.87–5.11)
RDW: 13 % (ref 11.5–15.5)
WBC: 10 K/uL (ref 4.0–10.5)
nRBC: 0 % (ref 0.0–0.2)

## 2023-11-22 LAB — D-DIMER, QUANTITATIVE: D-Dimer, Quant: 0.71 ug{FEU}/mL — ABNORMAL HIGH (ref 0.00–0.50)

## 2023-11-22 LAB — PRO BRAIN NATRIURETIC PEPTIDE: Pro Brain Natriuretic Peptide: 109 pg/mL (ref <300.0)

## 2023-11-22 LAB — TROPONIN T, HIGH SENSITIVITY: Troponin T High Sensitivity: <15 ng/L (ref 0–19)

## 2023-11-22 MED ORDER — IPRATROPIUM-ALBUTEROL 0.5-2.5 (3) MG/3ML IN SOLN
3.0000 mL | Freq: Once | RESPIRATORY_TRACT | Status: AC
  Administered 2023-11-22: 3 mL via RESPIRATORY_TRACT
  Filled 2023-11-22: qty 3

## 2023-11-22 MED ORDER — PREDNISONE 10 MG PO TABS: 30.0000 mg | ORAL_TABLET | Freq: Every day | ORAL | 0 refills | Status: AC

## 2023-11-22 MED ORDER — METHYLPREDNISOLONE SODIUM SUCC 125 MG IJ SOLR
125.0000 mg | Freq: Once | INTRAMUSCULAR | Status: AC
  Administered 2023-11-22: 125 mg via INTRAVENOUS
  Filled 2023-11-22: qty 2

## 2023-11-22 MED ORDER — IOHEXOL 350 MG/ML SOLN
75.0000 mL | Freq: Once | INTRAVENOUS | Status: AC | PRN
  Administered 2023-11-22: 75 mL via INTRAVENOUS

## 2023-11-22 MED ORDER — ALBUTEROL SULFATE (2.5 MG/3ML) 0.083% IN NEBU
2.5000 mg | INHALATION_SOLUTION | Freq: Once | RESPIRATORY_TRACT | Status: AC
  Administered 2023-11-22: 2.5 mg via RESPIRATORY_TRACT
  Filled 2023-11-22: qty 3

## 2023-11-22 NOTE — Discharge Instructions (Addendum)
 Evaluation for your shortness of breath is overall reassuring.  Symptoms could be related to a viral upper respiratory infection.  Recommend supportive treatment with rest and hydration.  You can take over-the-counter cough medication such as Mucinex as needed.  A prescription for steroids has been sent to your pharmacy.  They should help with the inflammation in your lungs and help with the mucus burden in your lungs as well which might improve your symptoms.   Your workup here showed that you have a low hemoglobin which is when your blood counts.  This is called an anemia.  Symptoms this can be due to iron deficiency. Please have this repeated with your PCP  Your labs today did not show any signs of heart attack.  No signs of a blood clot.  No signs of heart failure.  Your EKG which is a measure of your heart rhythm was normal.  Your CT scan and chest x-ray did not show any signs of pneumonia, fluid in the lungs, or other abnormality to explain your shortness of breath.   Your CT scan did show abnormal enlarged lymph nodes as well as a thyroid nodule.  You need to have repeat imaging of these areas to further evaluate these abnormalities as there is the potential that they could be cancerous.  Have included your CT scan result below for you to show your PCP within the next month  Your blood pressure was elevated here today at 163/82. Aim to get 30 minutes of exercise daily and limit intake of processed/fast foods. Please take and record your blood pressures at home. Take them at the same time every day. Follow up with your PCP within the next month to discuss if you may need treatment for your blood pressure.   Please return to the ER for any worsening shortness of breath, chest pain, any other new or concerning symptoms    EXAM:  CTA of the Chest with contrast for PE  11/22/2023 04:43:05 PM    TECHNIQUE:  CTA of the chest was performed after the administration of 75 mL of iohexol    (OMNIPAQUE ) 350 MG/ML injection. Multiplanar reformatted images are provided  for review. MIP images are provided for review. Automated exposure control,  iterative reconstruction, and/or weight based adjustment of the mA/kV was  utilized to reduce the radiation dose to as low as reasonably achievable.    COMPARISON:  Therapeutic CT chest dated 01/28/2017.    CLINICAL HISTORY:  Pulmonary embolism (PE) suspected, low to intermediate prob, positive D-dimer.    FINDINGS:    PULMONARY ARTERIES:  Pulmonary arteries are adequately opacified for evaluation. No pulmonary  embolism. Main pulmonary artery is normal in caliber.    MEDIASTINUM:  The heart and pericardium demonstrate no acute abnormality. There is no acute  abnormality of the thoracic aorta. Posterior left thyroid nodule measures 13  mm, unchanged.    LYMPH NODES:  Enlarged right hilar lymph node measures 12 mm. Enlarged paratracheal lymph  nodes measure up to 14 mm.    LUNGS AND PLEURA:  Minimal atelectatic changes in the lung bases. The lungs are otherwise clear.  No focal consolidation or pulmonary edema. No pleural effusion or pneumothorax.    UPPER ABDOMEN:  Limited images of the upper abdomen demonstrate hemodialysis of the abdominal  aorta.    SOFT TISSUES AND BONES:  The visualized foramina are patent and measure 12 mm. No acute soft tissue  abnormality.    IMPRESSION:  1. No evidence of pulmonary  embolism.  2. New indeterminate enlarged right hilar and paratracheal lymph nodes  measuring up to 14 mm. Recommend clinical correlation and follow-up. Malignancy  is not excluded.  3. Incidental 13 mm left thyroid nodule. Patient age assumed 55; recommend  non-emergent thyroid ultrasound per ACR guidelines.

## 2023-11-22 NOTE — ED Triage Notes (Signed)
 Pt c/o SHOB/ severe problems breathing, ablation on back Monday, woke up Tuesday w a 'cold.' Advises extreme [facility] w exertion, associated tightness in chest, advises it's constant, like it's suffocating me. Pain is worse on R side. Seen at [facility] for same, cxr performed & advised fluid on lungs.

## 2023-11-22 NOTE — ED Provider Notes (Addendum)
 [place] EMERGENCY DEPARTMENT AT [facility] Provider Note   CSN: [id number] Arrival date & time: 11/22/23  1419     Patient presents with: Shortness of Breath  HPI [name] is a 48 y.o. female with hypertension, TIA, OSA, fibromyalgia, hypercholesterolemia presenting for shortness of breath.  She states that this past Tuesday she had a nonproductive cough and then Wednesday felt short of breath.  Shortness of breath is made worse with exertion and lying flat.  She denies associated chest pain.  Denies fever.  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  Denies any notable swelling in her lower legs, calf tenderness, recent long trips and OCP use.    Shortness of Breath      Prior to Admission medications   Medication Sig Start Date End Date Taking? Authorizing Provider  predniSONE  (DELTASONE ) 10 MG tablet Take 3 tablets (30 mg total) by mouth daily for 4 days. 11/22/23 11/26/23 Yes Lang Norleen POUR, PA-C  ALPRAZolam  (XANAX ) 0.5 MG tablet Take 1 tablet (0.5 mg total) by mouth at bedtime as needed (in sleep lab use). 08/13/23   Dohmeier, Dedra, MD  amoxicillin-clavulanate (AUGMENTIN) 875-125 MG tablet Take 1 tablet by mouth every 12 (twelve) hours. 10/29/23   Palumbo, April, MD  DULoxetine  (CYMBALTA ) 60 MG capsule Take 1 capsule (60 mg total) by mouth daily. 03/22/21   Leigh Corean Massa, MD  Fremanezumab -vfrm (AJOVY ) 225 MG/1.5ML SOSY Inject 225 mg into the skin every 30 (thirty) days. 03/17/19   Lomax, Amy, NP  hydroxychloroquine (PLAQUENIL) 200 MG tablet Take 400 mg by mouth daily. 07/26/23   [provider]  traZODone  (DESYREL ) 50 MG tablet Take 50 mg by mouth at bedtime as needed. 03/09/21   [provider]  Ubrogepant  (UBRELVY ) 100 MG TABS Take 100 mg by mouth daily as needed. Take one tablet at onset of headache, may repeat 1 tablet in 2 hours, no more than 2 tablets in 24 hours. 03/17/19   Lomax,  Amy, NP  venlafaxine  XR (EFFEXOR -XR) 37.5 MG 24 hr capsule Take 1 capsule (37.5 mg total) by mouth daily with breakfast. Take until gone and then stop 03/22/21   Leigh Corean Massa, MD    Allergies: Hydrocodone  and Topiramate    Review of Systems  Respiratory:  Positive for shortness of breath.     Updated Vital Signs BP (!) 163/82   Pulse 91   Temp 98.7 F (37.1 C)   Resp 14   LMP 05/31/2017   SpO2 94%   Physical Exam Vitals and nursing note reviewed.  HENT:     Head: Normocephalic and atraumatic.     Mouth/Throat:     Mouth: Mucous membranes are moist.  Eyes:     General:        Right eye: No discharge.        Left eye: No discharge.     Conjunctiva/sclera: Conjunctivae normal.  Cardiovascular:     Rate and Rhythm: Normal rate and regular rhythm.     Pulses: Normal pulses.     Heart sounds: Normal heart sounds.  Pulmonary:     Effort: Pulmonary effort is normal.     Breath sounds: Normal breath sounds and air entry.  Abdominal:     General: Abdomen is flat.     Palpations: Abdomen is soft.  Musculoskeletal:     Right lower leg: No edema.     Left lower leg:  No edema.  Skin:    General: Skin is warm and dry.  Neurological:     General: No focal deficit present.  Psychiatric:        Mood and Affect: Mood normal.     (all labs ordered are listed, but only abnormal results are displayed) Labs Reviewed  CBC - Abnormal; Notable for the following components:      Result Value   Hemoglobin 11.1 (*)    HCT 33.3 (*)    All other components within normal limits  D-DIMER, QUANTITATIVE - Abnormal; Notable for the following components:   D-Dimer, Quant 0.71 (*)    All other components within normal limits  BASIC METABOLIC PANEL WITH GFR  PRO BRAIN NATRIURETIC PEPTIDE    EKG: EKG Interpretation Date/Time:  Friday November 22 2023 14:35:24 EST Ventricular Rate:  93 PR Interval:  134 QRS Duration:  99 QT Interval:  344 QTC Calculation: 428 R  Axis:   77  Text Interpretation: Sinus rhythm Low voltage, precordial leads Consider anterior infarct Confirmed by Yolande Charleston [phone]) on 11/22/2023 3:56:59 PM  Radiology: CT Angio Chest PE W/Cm &/Or Wo Cm Result Date: 11/22/2023 EXAM: CTA of the Chest with contrast for PE 11/22/2023 04:43:05 PM TECHNIQUE: CTA of the chest was performed after the administration of 75 mL of iohexol  (OMNIPAQUE ) 350 MG/ML injection. Multiplanar reformatted images are provided for review. MIP images are provided for review. Automated exposure control, iterative reconstruction, and/or weight based adjustment of the mA/kV was utilized to reduce the radiation dose to as low as reasonably achievable. COMPARISON: Therapeutic CT chest dated 01/28/2017. CLINICAL HISTORY: Pulmonary embolism (PE) suspected, low to intermediate prob, positive D-dimer. FINDINGS: PULMONARY ARTERIES: Pulmonary arteries are adequately opacified for evaluation. No pulmonary embolism. Main pulmonary artery is normal in caliber. MEDIASTINUM: The heart and pericardium demonstrate no acute abnormality. There is no acute abnormality of the thoracic aorta. Posterior left thyroid nodule measures 13 mm, unchanged. LYMPH NODES: Enlarged right hilar lymph node measures 12 mm. Enlarged paratracheal lymph nodes measure up to 14 mm. LUNGS AND PLEURA: Minimal atelectatic changes in the lung bases. The lungs are otherwise clear. No focal consolidation or pulmonary edema. No pleural effusion or pneumothorax. UPPER ABDOMEN: Limited images of the upper abdomen demonstrate hemodialysis of the abdominal aorta. SOFT TISSUES AND BONES: The visualized foramina are patent and measure 12 mm. No acute soft tissue abnormality. IMPRESSION: 1. No evidence of pulmonary embolism. 2. New indeterminate enlarged right hilar and paratracheal lymph nodes measuring up to 14 mm. Recommend clinical correlation and follow-up. Malignancy is not excluded. 3. Incidental 13 mm left thyroid nodule.  Patient age assumed 61; recommend non-emergent thyroid ultrasound per ACR guidelines. Electronically signed by: Greig Pique MD 11/22/2023 05:13 PM EST RP Workstation: [id number]   DG Chest 2 View Result Date: 11/22/2023 EXAM: 2 VIEW(S) XRAY OF THE CHEST 11/22/2023 02:41:00 PM COMPARISON: CLINICAL HISTORY: SOB FINDINGS: LUNGS AND PLEURA: No focal pulmonary opacity. No pulmonary edema. No pleural effusion. No pneumothorax. HEART AND MEDIASTINUM: No acute abnormality of the cardiac and mediastinal silhouettes. BONES AND SOFT TISSUES: No acute osseous abnormality. IMPRESSION: 1. No acute process. Electronically signed by: Lynwood Seip MD 11/22/2023 03:21 PM EST RP Workstation: [id number]     Procedures   Medications Ordered in the ED  iohexol  (OMNIPAQUE ) 350 MG/ML injection 75 mL (75 mLs Intravenous Contrast Given 11/22/23 1625)  albuterol  (PROVENTIL ) (2.5 MG/3ML) 0.083% nebulizer solution 2.5 mg (2.5 mg Nebulization Given 11/22/23 1702)  ipratropium-albuterol  (DUONEB) 0.5-2.5 (3) MG/3ML nebulizer  solution 3 mL (3 mLs Nebulization Given 11/22/23 1751)  methylPREDNISolone  sodium succinate (SOLU-MEDROL ) 125 mg/2 mL injection 125 mg (125 mg Intravenous Given 11/22/23 1731)    Clinical Course as of 11/22/23 1816  Fri Nov 22, 2023  1715 CT Angio Chest PE W/Cm &/Or Wo Cm [JR]    Clinical Course User Index [JR] Lang Norleen POUR, PA-C                                 Medical Decision Making Amount and/or Complexity of Data Reviewed Labs: ordered. Radiology: ordered. Decision-making details documented in ED Course.  Risk Prescription drug management.   Initial Impression and Ddx 48 year old well-appearing female presenting for shortness of breath.  Exam is unremarkable.  DDx includes PE, CHF exacerbation, pneumonia, pneumothorax, COPD exacerbation, other. Patient PMH that increases complexity of ED encounter:  hypertension, TIA, OSA, fibromyalgia, hypercholesterolemia  Interpretation of  Diagnostics - I independent reviewed and interpreted the labs as followed: elevated dimer 0.71, anemia 11.1, pro BNP normal  - I independently visualized the following imaging with scope of interpretation limited to determining acute life threatening conditions related to emergency care: CT angio chest PE, which revealed negative for acute findings but did reveal New indeterminate enlarged right hilar and paratracheal lymph nodes measuring up to 14 mm. Recommend clinical correlation and follow-up. Malignancy is not excluded. 3. Incidental 13 mm left thyroid nodule. Shared patients with findings.   - I personally reviewed and interpreted EKG which revealed sinus rhythm  Patient Reassessment and Ultimate Disposition/Management On reassessment after albuterol  and DuoNeb, patient stated that symptoms had improved.  Also gave a dose of Solu-Medrol .  No clear explanation for her symptoms today aside from what could be a viral URI.  Attempted to ambulate and patient endorsed increased work of breathing and sats noted to be in the low 90s but recovered when she sat down.  Now endorsing central chest tightness.  Ordered troponins and DuoNeb.  If troponin is negative and she feels better after DuoNeb and able to ambulate without evidence of respiratory distress she can be discharged with steroids albuterol  and outpatient follow-up.  Otherwise will need to be admitted for hypoxia.  Signed out patient to PA Alan Harari.  Patient management required discussion with the following services or consulting groups:  None  Complexity of Problems Addressed Acute complicated illness or Injury  Additional Data Reviewed and Analyzed Further history obtained from: Further history from spouse/family member and Past medical history and medications listed in the EMR  Patient Encounter Risk Assessment Consideration of hospitalization      Final diagnoses:  Shortness of breath    ED Discharge Orders           Ordered    predniSONE  (DELTASONE ) 10 MG tablet  Daily        11/22/23 1724               Lang Norleen POUR, PA-C 11/22/23 1846    Doretha Folks, MD 11/27/23 1643

## 2023-11-22 NOTE — ED Notes (Signed)
 Pt ambulated down the hall, 02 stayed at 93, still increased WOB and pt expressed chest pain. PA aware.

## 2023-11-22 NOTE — ED Provider Notes (Signed)
 Awaiting troponin and repeat ambulation attempt. Patient with SOB for a couple days and a mild dry cough Physical Exam  BP (!) 163/82   Pulse 91   Temp 98.9 F (37.2 C)   Resp 14   LMP 05/31/2017   SpO2 94%   Physical Exam Vitals and nursing note reviewed.  Constitutional:      Appearance: Normal appearance. She is obese.  HENT:     Head: Atraumatic.  Cardiovascular:     Rate and Rhythm: Normal rate and regular rhythm.  Pulmonary:     Effort: Pulmonary effort is normal.     Breath sounds: Normal breath sounds.     Comments: Talking in full sentences on room air without difficulty Neurological:     General: No focal deficit present.     Mental Status: She is alert.  Psychiatric:        Mood and Affect: Mood normal.        Behavior: Behavior normal.     Procedures  Procedures  ED Course / MDM   Clinical Course as of 11/22/23 2010  Fri Nov 22, 2023  1715 CT Angio Chest PE W/Cm &/Or Wo Cm [JR]    Clinical Course User Index [JR] Lang Norleen POUR, PA-C   Medical Decision Making Amount and/or Complexity of Data Reviewed Labs: ordered. Radiology: ordered. Decision-making details documented in ED Course.  Risk Prescription drug management.   Patient received at shift change.  In short, patient presenting with a couple days of shortness of breath and mild dry cough.   Her workup here is reassuring.  CBC with mild anemia at 11.  Patient does report history of iron deficiency.  She denies any blood in her stool or tarry appearing stools.  Reports that she had a hysterectomy, no longer having vaginal bleeding.  Do not feel that this is the cause of her symptoms today as it is only mildly low.  BMP within normal limits.  Her D-dimer was elevated at 0.71, CTA chest was obtained which did not show any evidence of pulmonary embolism.  Incidental finding of enlarged lymph node in the chest was noted as well as a thyroid nodule.  Both of these findings were discussed with the  patient, and she understands she needs to obtain a repeat chest CT and thyroid ultrasound to further evaluate these areas as they could be cancerous.  She understands that this is something she will need to inform her PCP of this that they can order the studies for her.  No signs of pneumonia, pleural effusion, or other acute abnormality on chest x-ray or CTA chest.  proBNP within normal limits, no pulmonary edema on chest x-ray, no edema noted on exam, low concern for heart failure.  Troponin within normal limits, no chest pain, shortness of breath has been ongoing for couple of days, low concern for ACS.   Given her mild dry cough that has also accompanied her shortness of breath, question if this is more of a viral URI.  Patient remains with normal vitals aside from her elevated blood pressure 163/82.  Maintaining 94% oxygen saturation room air.  Not tachycardic.    Feel she is stable and appropriate for discharge home.  A prescription for steroids has been sent in by the previous team.  We will have her follow-up with her PCP for recheck within the next 5 days, and she will also inform her PCP of the incidental findings noted on chest CT.  Veta Palma, PA-C 11/22/23 2010    Yolande Lamar BROCKS, MD 11/25/23 [phone]

## 2023-11-25 NOTE — Telephone Encounter (Signed)
Called and LVM for pt to call back and schedule Initial Cpap appt.

## 2023-11-26 NOTE — Telephone Encounter (Signed)
 Pt was scheduled for her initial CPAP visit on 12/11/23 Pt was informed to bring machine and power cord to the appointment.  DME in pt's SnapShot.

## 2023-11-28 ENCOUNTER — Ambulatory Visit: Admitting: Nurse Practitioner

## 2023-12-03 ENCOUNTER — Ambulatory Visit: Admitting: Nurse Practitioner

## 2023-12-03 ENCOUNTER — Encounter: Payer: Self-pay | Admitting: Nurse Practitioner

## 2023-12-03 VITALS — HR 85 | Wt 285.2 lb

## 2023-12-03 DIAGNOSIS — E66813 Obesity, class 3: Secondary | ICD-10-CM

## 2023-12-03 DIAGNOSIS — G4733 Obstructive sleep apnea (adult) (pediatric): Secondary | ICD-10-CM

## 2023-12-03 DIAGNOSIS — Z6841 Body Mass Index (BMI) 40.0 and over, adult: Secondary | ICD-10-CM

## 2023-12-03 NOTE — Progress Notes (Signed)
 OSA CPAP 30 days lost 12.5 lbs.

## 2023-12-05 ENCOUNTER — Encounter: Payer: Self-pay | Admitting: Nurse Practitioner

## 2023-12-05 NOTE — Assessment & Plan Note (Addendum)
 [name]

## 2023-12-05 NOTE — Progress Notes (Signed)
° °  Subjective:  I had a sleep test and confirmed that I have sleep apnea and want to start on Zepbound to help with my weight.  HPI: [name] is a 48 y.o. female presenting on 12/03/2023 for weight loss management. She reports she had her sleep study done and she was diagnosed with sleep apnea and have been using her CPAP machine. She reports she would like to start on the Zepbound for her weight loss journey. Patient has been on the [employer] weight loss plan for a long time and doing good with her healthy eating habit, however, she is having difficulty with losing any significant weight. Her BMI is currently 43, which is classified as having Class III obesity, also known as severe obesity.   ROS: Negative unless specifically indicated above in HPI.   Relevant past medical history reviewed and updated as indicated.   Allergies and medications reviewed and updated.    Allergies  Allergen Reactions   Hydrocodone      Unsure if true allergy    Topiramate     Lost all feeling in upper and lower extremities for 6 months    Objective:   Pulse 85   Wt 285 lb 3.2 oz (129.4 kg)   LMP 05/31/2017   SpO2 96%   BMI 48.95 kg/m    Physical Exam Constitutional:      Appearance: Normal appearance.  HENT:     Head: Normocephalic.  Eyes:     Conjunctiva/sclera: Conjunctivae normal.  Cardiovascular:     Rate and Rhythm: Normal rate and regular rhythm.  Pulmonary:     Effort: Pulmonary effort is normal.     Breath sounds: Normal breath sounds.  Skin:    General: Skin is warm and dry.  Neurological:     General: No focal deficit present.     Mental Status: She is alert and oriented to person, place, and time.  Psychiatric:        Mood and Affect: Mood normal.        Behavior: Behavior normal.        Thought Content: Thought content normal.        Judgment: Judgment normal.    Assessment & Plan:   Assessment & Plan Class 3 severe obesity due to excess calories with serious  comorbidity and body mass index (BMI) of 45.0 to 49.9 in adult (HCC)     OSA on CPAP     Start Zepbound 2.5 mg (0.5 mL) subcutaneously weekly as directed Continue wearing CPAP at night   Follow up plan: Return in about 4 weeks (around 12/31/2023) for [facility] .  Florencia Cousin, NP

## 2023-12-05 NOTE — Assessment & Plan Note (Addendum)
 SABRA
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
## 2023-12-09 NOTE — Patient Instructions (Incomplete)
 Please continue using your CPAP regularly. While your insurance requires that you use CPAP at least 4 hours each night on 70% of the nights, I recommend, that you not skip any nights and use it throughout the night if you can. Getting used to CPAP and staying with the treatment long term does take time and patience and discipline. Untreated obstructive sleep apnea when it is moderate to severe can have an adverse impact on cardiovascular health and raise her risk for heart disease, arrhythmias, hypertension, congestive heart failure, stroke and diabetes. Untreated obstructive sleep apnea causes sleep disruption, nonrestorative sleep, and sleep deprivation. This can have an impact on your day to day functioning and cause daytime sleepiness and impairment of cognitive function, memory loss, mood disturbance, and problems focussing. Using CPAP regularly can improve these symptoms.  We will switch Ajovy  to Emgality . Take 2 injections for first dose. Call me if well tolerated and I will call in maintenance dose. Continue Ubrevly for abortive therapy. Discuss Zepbound with PCP.   I recommend discussion with your PCP regarding need for statin medication due to history of stroke in 2018. Your last LDL was 130. Goal is less than 70 in stroke patients. I recommend taking a baby aspirin daily if no other contraindications.   Follow up in 1 year

## 2023-12-10 NOTE — Progress Notes (Unsigned)
 [name]

## 2023-12-11 ENCOUNTER — Other Ambulatory Visit: Payer: Self-pay | Admitting: Family Medicine

## 2023-12-11 ENCOUNTER — Telehealth: Payer: Self-pay

## 2023-12-11 ENCOUNTER — Other Ambulatory Visit (HOSPITAL_COMMUNITY): Payer: Self-pay

## 2023-12-11 ENCOUNTER — Encounter: Payer: Self-pay | Admitting: Family Medicine

## 2023-12-11 ENCOUNTER — Telehealth: Payer: Self-pay | Admitting: Pharmacist

## 2023-12-11 ENCOUNTER — Ambulatory Visit (INDEPENDENT_AMBULATORY_CARE_PROVIDER_SITE_OTHER): Admitting: Family Medicine

## 2023-12-11 ENCOUNTER — Encounter: Payer: Self-pay | Admitting: Hematology

## 2023-12-11 VITALS — BP 132/72 | HR 68 | Ht 63.0 in | Wt 284.2 lb

## 2023-12-11 DIAGNOSIS — G43109 Migraine with aura, not intractable, without status migrainosus: Secondary | ICD-10-CM | POA: Diagnosis not present

## 2023-12-11 DIAGNOSIS — Z6841 Body Mass Index (BMI) 40.0 and over, adult: Secondary | ICD-10-CM

## 2023-12-11 DIAGNOSIS — Z8673 Personal history of transient ischemic attack (TIA), and cerebral infarction without residual deficits: Secondary | ICD-10-CM

## 2023-12-11 DIAGNOSIS — G4733 Obstructive sleep apnea (adult) (pediatric): Secondary | ICD-10-CM

## 2023-12-11 MED ORDER — EMGALITY 120 MG/ML ~~LOC~~ SOAJ: 240.0000 mg | Freq: Once | SUBCUTANEOUS | 0 refills | Status: DC

## 2023-12-11 NOTE — Telephone Encounter (Signed)
 Pharmacy Patient Advocate Encounter   Received notification from Physician's Office that prior authorization for Emgality  2ml Loading dose is required/requested.   Insurance verification completed.   The patient is insured through [facility].   Per test claim: PA required; PA started via CoverMyMeds. KEY [id number] . Waiting for clinical questions to populate.

## 2023-12-11 NOTE — Telephone Encounter (Signed)
 Tried to resubmit for 2ml loading dose, PA was previously submitted for 2ml but looks like plan only approved it as 1ml per 30 days. Would not let me resubmit for the 40ml-will outreach insurance to try to get this corrected.

## 2023-12-11 NOTE — Telephone Encounter (Signed)
 Pharmacy Patient Advocate Encounter   Received notification from Patient Pharmacy that prior authorization for Emgality  120MG /ML auto-injectors (migraine) is required/requested.   Insurance verification completed.   The patient is insured through [facility].   Per test claim: PA required; PA submitted to above mentioned insurance via Latent Key/confirmation #/EOC [id number] Status is pending

## 2023-12-11 NOTE — Telephone Encounter (Signed)
 Pharmacy Patient Advocate Encounter  Received notification from [employer] that Prior Authorization for EMGALITY  120 MG/ML [place] SO[employer] has been APPROVED from 12/11/2023 to 03/12/2024   PA #/Case ID/Reference #: [id number]

## 2023-12-13 ENCOUNTER — Other Ambulatory Visit (HOSPITAL_COMMUNITY): Payer: Self-pay

## 2023-12-16 ENCOUNTER — Encounter: Payer: Self-pay | Admitting: Hematology

## 2023-12-16 ENCOUNTER — Other Ambulatory Visit (HOSPITAL_COMMUNITY): Payer: Self-pay

## 2023-12-16 NOTE — Telephone Encounter (Signed)
 [facility] specialty @ [phone] was able to do an override and get a paid test claim for the 21ml-I am unable to run test claim due to [employer] having issues. PT should be able to pick up her loading dose now. I called [facility] to ask them to fill med for PT however they are closed for lunch from 1:30pm-2pm so I left a detailed message on their voice mail for provider line with details and requesting them to get med ready for the PT and if hey have any issues with the loading dose to contact me and left my contact information as well. Sent PT a MC message to advise her to [facility] out [facility] before she goes to the pharmacy to ensure they have filled the med and have it ready for her.

## 2023-12-16 NOTE — Telephone Encounter (Signed)
 I called [facility] to follow up, after 46:28 mins OTP with one department they transfer me to the PA dept to get the 2ml situated and the line was disconnected on their end. Trying to call back now to follow up.   I called back and was told I needed to talk to specialty and was transferred to [phone] Spent 13 mins on this call.  Informed me this med is not speciality for this plan and I need to speak with non specialty at [phone].

## 2024-01-01 ENCOUNTER — Encounter: Payer: Self-pay | Admitting: Family Medicine

## 2024-01-23 ENCOUNTER — Ambulatory Visit: Admitting: Nurse Practitioner

## 2024-02-04 ENCOUNTER — Encounter: Payer: Self-pay | Admitting: Nurse Practitioner

## 2024-02-04 ENCOUNTER — Ambulatory Visit: Admitting: Nurse Practitioner

## 2024-02-04 NOTE — Progress Notes (Unsigned)
" ° °  Subjective:  No chief complaint on file.    HPI: [name] is a 49 y.o. female presenting on 02/04/2024 with report of ***    ROS: Negative unless specifically indicated above in HPI.   Relevant past medical history reviewed and updated as indicated.   Allergies and medications reviewed and updated.   Current Outpatient Medications  Medication Instructions   Ajovy  225 mg, Subcutaneous, Every 30 days   DULoxetine  (CYMBALTA ) 60 mg, Oral, Daily   hydroxychloroquine (PLAQUENIL) 400 mg, Daily   traZODone  (DESYREL ) 50 mg, At bedtime PRN   Ubrelvy  100 mg, Oral, Daily PRN, Take one tablet at onset of headache, may repeat 1 tablet in 2 hours, no more than 2 tablets in 24 hours.     Allergies[1]  Objective:   LMP 05/31/2017    Physical Exam Constitutional:      Appearance: Normal appearance.  HENT:     Head: Normocephalic.  Eyes:     Conjunctiva/sclera: Conjunctivae normal.  Cardiovascular:     Rate and Rhythm: Normal rate and regular rhythm.  Pulmonary:     Effort: Pulmonary effort is normal.     Breath sounds: Normal breath sounds.  Skin:    General: Skin is warm and dry.  Neurological:     General: No focal deficit present.     Mental Status: She is alert and oriented to person, place, and time.  Psychiatric:        Mood and Affect: Mood normal.        Behavior: Behavior normal.        Thought Content: Thought content normal.        Judgment: Judgment normal.     Assessment & Plan:   Assessment & Plan     Follow up plan: No follow-ups on file.  Florencia Cousin, NP       [1] Allergies Allergen Reactions   Hydrocodone      Unsure if true allergy    Topiramate     Lost all feeling in upper and lower extremities for 6 months  "

## 2024-03-31 ENCOUNTER — Ambulatory Visit: Admitting: Nurse Practitioner

## 2024-07-06 ENCOUNTER — Telehealth: Admitting: Family Medicine
